# Patient Record
Sex: Female | Born: 1935
Health system: Southern US, Community
[De-identification: ages and names within clinical notes are randomized; demographics above are authoritative.]

## PROBLEM LIST (undated history)

## (undated) DIAGNOSIS — R011 Cardiac murmur, unspecified: Secondary | ICD-10-CM

## (undated) DIAGNOSIS — C439 Malignant melanoma of skin, unspecified: Secondary | ICD-10-CM

## (undated) DIAGNOSIS — J45909 Unspecified asthma, uncomplicated: Secondary | ICD-10-CM

## (undated) DIAGNOSIS — D320 Benign neoplasm of cerebral meninges: Secondary | ICD-10-CM

## (undated) DIAGNOSIS — R413 Other amnesia: Secondary | ICD-10-CM

## (undated) DIAGNOSIS — E7800 Pure hypercholesterolemia, unspecified: Secondary | ICD-10-CM

## (undated) DIAGNOSIS — D649 Anemia, unspecified: Secondary | ICD-10-CM

## (undated) DIAGNOSIS — M1990 Unspecified osteoarthritis, unspecified site: Secondary | ICD-10-CM

## (undated) DIAGNOSIS — I10 Essential (primary) hypertension: Secondary | ICD-10-CM

## (undated) DIAGNOSIS — K219 Gastro-esophageal reflux disease without esophagitis: Secondary | ICD-10-CM

## (undated) HISTORY — DX: Other amnesia: R41.3

## (undated) HISTORY — PX: TOTAL ABDOMINAL HYSTERECTOMY: SHX209

## (undated) HISTORY — PX: JOINT REPLACEMENT: SHX530

## (undated) HISTORY — PX: REPLACEMENT TOTAL KNEE: SUR1224

## (undated) HISTORY — PX: APPENDECTOMY: SHX54

---

## 2000-03-04 ENCOUNTER — Encounter: Admission: RE | Admit: 2000-03-04 | Discharge: 2000-03-04 | Payer: Self-pay | Admitting: Internal Medicine

## 2000-03-04 ENCOUNTER — Encounter: Payer: Self-pay | Admitting: Internal Medicine

## 2001-03-19 ENCOUNTER — Encounter: Payer: Self-pay | Admitting: Internal Medicine

## 2001-03-19 ENCOUNTER — Encounter: Admission: RE | Admit: 2001-03-19 | Discharge: 2001-03-19 | Payer: Self-pay | Admitting: Internal Medicine

## 2002-03-30 ENCOUNTER — Encounter: Payer: Self-pay | Admitting: Internal Medicine

## 2002-03-30 ENCOUNTER — Encounter: Admission: RE | Admit: 2002-03-30 | Discharge: 2002-03-30 | Payer: Self-pay | Admitting: Internal Medicine

## 2003-03-22 ENCOUNTER — Encounter: Payer: Self-pay | Admitting: Internal Medicine

## 2003-03-22 ENCOUNTER — Encounter: Admission: RE | Admit: 2003-03-22 | Discharge: 2003-03-22 | Payer: Self-pay | Admitting: Internal Medicine

## 2003-03-28 ENCOUNTER — Encounter: Payer: Self-pay | Admitting: Internal Medicine

## 2003-03-28 ENCOUNTER — Encounter: Admission: RE | Admit: 2003-03-28 | Discharge: 2003-03-28 | Payer: Self-pay | Admitting: Internal Medicine

## 2004-05-01 ENCOUNTER — Encounter: Admission: RE | Admit: 2004-05-01 | Discharge: 2004-05-01 | Payer: Self-pay | Admitting: Internal Medicine

## 2005-07-30 ENCOUNTER — Encounter: Admission: RE | Admit: 2005-07-30 | Discharge: 2005-07-30 | Payer: Self-pay | Admitting: Internal Medicine

## 2007-02-19 ENCOUNTER — Encounter: Admission: RE | Admit: 2007-02-19 | Discharge: 2007-02-19 | Payer: Self-pay | Admitting: Internal Medicine

## 2008-02-22 ENCOUNTER — Encounter: Admission: RE | Admit: 2008-02-22 | Discharge: 2008-02-22 | Payer: Self-pay | Admitting: Internal Medicine

## 2008-05-05 ENCOUNTER — Inpatient Hospital Stay (HOSPITAL_COMMUNITY): Admission: RE | Admit: 2008-05-05 | Discharge: 2008-05-09 | Payer: Self-pay | Admitting: Orthopaedic Surgery

## 2008-05-06 ENCOUNTER — Ambulatory Visit: Payer: Self-pay | Admitting: Physical Medicine & Rehabilitation

## 2009-02-23 ENCOUNTER — Encounter: Admission: RE | Admit: 2009-02-23 | Discharge: 2009-02-23 | Payer: Self-pay | Admitting: Internal Medicine

## 2010-02-26 ENCOUNTER — Encounter: Admission: RE | Admit: 2010-02-26 | Discharge: 2010-02-26 | Payer: Self-pay | Admitting: Internal Medicine

## 2010-05-21 ENCOUNTER — Observation Stay (HOSPITAL_COMMUNITY)
Admission: EM | Admit: 2010-05-21 | Discharge: 2010-05-23 | Payer: Self-pay | Source: Home / Self Care | Attending: Internal Medicine | Admitting: Internal Medicine

## 2010-08-13 LAB — BASIC METABOLIC PANEL
BUN: 13 mg/dL (ref 6–23)
BUN: 14 mg/dL (ref 6–23)
CO2: 23 mEq/L (ref 19–32)
CO2: 27 mEq/L (ref 19–32)
Calcium: 9.5 mg/dL (ref 8.4–10.5)
Chloride: 100 mEq/L (ref 96–112)
Creatinine, Ser: 0.93 mg/dL (ref 0.4–1.2)
GFR calc non Af Amer: 40 mL/min — ABNORMAL LOW (ref >60)
GFR calc non Af Amer: 58 mL/min — ABNORMAL LOW (ref >60)
Glucose, Bld: 115 mg/dL — ABNORMAL HIGH (ref 70–99)
Sodium: 136 mEq/L (ref 135–145)
Sodium: 138 mEq/L (ref 135–145)

## 2010-08-13 LAB — COMPREHENSIVE METABOLIC PANEL
ALT: 11 U/L (ref 0–35)
AST: 16 U/L (ref 0–37)
Alkaline Phosphatase: 52 U/L (ref 39–117)
BUN: 20 mg/dL (ref 6–23)
Creatinine, Ser: 1.18 mg/dL (ref 0.4–1.2)
Glucose, Bld: 104 mg/dL — ABNORMAL HIGH (ref 70–99)
Total Bilirubin: 0.4 mg/dL (ref 0.3–1.2)

## 2010-08-13 LAB — URINALYSIS, ROUTINE W REFLEX MICROSCOPIC
Glucose, UA: NEGATIVE mg/dL
Protein, ur: NEGATIVE mg/dL
pH: 7.5 (ref 5.0–8.0)

## 2010-08-13 LAB — CBC
HCT: 37.5 % (ref 36.0–46.0)
MCH: 30 pg (ref 26.0–34.0)
Platelets: 221 10*3/uL (ref 150–400)
RBC: 4.13 MIL/uL (ref 3.87–5.11)
RDW: 12.9 % (ref 11.5–15.5)
WBC: 6.6 10*3/uL (ref 4.0–10.5)
WBC: 6.7 10*3/uL (ref 4.0–10.5)

## 2010-10-16 NOTE — Op Note (Signed)
[name], West               ACCOUNT NO.:  [id number]   MEDICAL RECORD NO.:  [id number]          PATIENT TYPE:  INP   LOCATION:  5013                         FACILITY:  [facility]   PHYSICIAN:  Lubertha Basque. Dalldorf, M.D.DATE OF BIRTH:  Sep 19, 1929   DATE OF PROCEDURE:  05/05/2008  DATE OF DISCHARGE:                               OPERATIVE REPORT   PREOPERATIVE DIAGNOSIS:  Right knee degenerative arthritis.   POSTOPERATIVE DIAGNOSIS:  Right knee degenerative arthritis.   PROCEDURE:  Right total knee replacement.   ANESTHESIA:  General and block.   ATTENDING SURGEON:  Lubertha Basque. Jerl Santos, MD   ASSISTANT:  Lindwood Qua, PA   INDICATIONS FOR PROCEDURE:  The patient is a 77 year old woman with a  very long history of right knee pain.  This has persisted despite oral  anti-inflammatories and several injections all which have afforded her  some relief.  She has pain which limits her ability to rest and walk and  at this point she is offered a knee replacement.  Informed operative  consent was obtained after discussion of possible complications  including reaction to anesthesia, infection, DVT, PE, and death.  The  importance of the postoperative rehabilitation protocol to optimize  result was also stressed with the patient.   SUMMARY OF FINDINGS AND PROCEDURE:  Under general anesthesia and a  block, right knee replacement was performed.  She had advanced  degenerative change medial and patellofemoral and good bone quality.  We  addressed her problem with a cemented DePuy LPS system using standard  plus femur, four tibia, 10-mm deep dish spacer, and 35-mm all  polyethylene patella.  Bryna Colander assisted throughout and was  invaluable to the completion of the case and that he helped position and  retract while I performed the procedure.  He also closed simultaneously  to help minimize OR time.  We did include Zinacef antibiotic in the  cement and she easily flexed up to 120 against  gravity at the end of the  case.   DESCRIPTION OF PROCEDURE:  The patient was taken to the operating suite  where general anesthetic was applied without difficulty.  She was also  given a block in the preanesthesia area.  She was positioned supine and  prepped and draped in normal sterile fashion.  After administration of  IV Kefzol, the right leg was elevated, exsanguinated, and tourniquet  inflated about the thigh.  A longitudinal incision was made with  dissection down the extensor mechanism.  All appropriate anti-infected  measures were used including closed hooded exhaust systems for each  member of the surgical team, Betadine impregnated drape, and  preoperative IV antibiotic.  A medial parapatellar incision was made.  The kneecap was flipped and the knee flexed.  Some residual meniscal  tissues were removed along the ACL and PCL.  An extramedullary guide was  placed on the tibia to make a cut with a slight posterior tilt.  The  femur was then dressed with an intramedullary guide making anterior and  posterior cuts creating a flexion gap of 10 mm.  A second intramedullary  guide  was placed in the femur to make a distal cut creating an extension  gap of 10 mm balancing the knee.  We did set up slightly loose with her  flexion contracture preoperatively in mind.  The femur sized to a  standard plus and the tibia to a four and appropriate guides were placed  and utilized.  The patella was cut down thickness by 8 mm to 15 and  sized to a 35 with appropriate guide placed and utilized.  A trial  reduction was done and she easily came into full extension and slight  hyperextension.  She flexed well and the patella tracked well.  Trial  components were removed followed by pulsatile lavage irrigation of all  three cut bony surfaces.  Cement was mixed including Zinacef and was  pressurized onto the bones followed by placement of the aforementioned  DePuy LPS components.  Excess cement was  trimmed and pressure was held  in the components until the cement had hardened.  The tourniquet was  deflated and a small amount of bleeding was easily controlled with  pressure and Bovie cautery.  The knee was again irrigated followed by  placement of a drain exiting superolaterally.  The extensor mechanism  was reapproximated with #1 Vicryl in interrupted fashion followed by  subcutaneous reapproximation with an 0 and 2-0 undyed Vicryl and skin  closure with staples.  Adaptic was applied followed by dry gauze and a  loose Ace wrap.  The estimated blood loss and intraoperative fluids can  be obtained from anesthesia records as can accurate tourniquet time.   DISPOSITION:  The patient was extubated in the operating room and taken  to recovery room in stable addition.  She was to be admitted to the  Orthopedic Surgery Service for appropriate postop care to include  perioperative antibiotics and Coumadin plus Lovenox for DVT prophylaxis.      Lubertha Basque Jerl Santos, M.D.  Electronically Signed     PGD/MEDQ  D:  05/05/2008  T:  05/05/2008  Job:  [id number]

## 2010-10-16 NOTE — Discharge Summary (Signed)
NAMEORPHIA, MCTIGUE               ACCOUNT NO.:  [id number]   MEDICAL RECORD NO.:  [id number]          PATIENT TYPE:  INP   LOCATION:  5013                         FACILITY:  [facility]   PHYSICIAN:  Lubertha Basque. Dalldorf, M.D.DATE OF BIRTH:  [birth year]   DATE OF ADMISSION:  05/05/2008  DATE OF DISCHARGE:  05/09/2008                               DISCHARGE SUMMARY   ADMISSION DIAGNOSES:  1. Right knee end-stage degenerative joint disease.  2. History of hypertension.  3. History of melanoma.  4. History of anemia.   DISCHARGE DIAGNOSIS:  1. Right knee end-stage degenerative joint disease.  2. History of hypertension.  3. History of melanoma.  4. History of anemia.  5. Hypokalemia.   BRIEF HISTORY:  Ms. Sheila West is a 77 year old white female patient well-  known to our practice who is having increasing right knee pain, pain  with every step, trouble sleeping at night time, and x-rays reveal end-  stage degenerative joint disease.  She has failed corticosteroid  injections, Synvisc injections and nonsteroidal anti-inflammatory drugs  as well.  We have discussed treatment options with her, that being total  knee replacement, with the risks of anesthesia, infection, DVT and  possible death.   PERTINENT LABORATORY AND X-RAY FINDINGS:  CBC:  Hemoglobin 11.7,  hematocrit 34, WBCs 10.4, platelets 269.  Sodium 138, potassium dropped  to 3.1 and was supplemented orally, chloride 97, BUN 16, creatinine 0.9,  glucose ranged from 98 to 150.  Last INR 3.0.   COURSE IN THE HOSPITAL:  She was admitted postoperatively, placed on  variety p.o. and IM analgesics for pain, including an IV PCA pump for  pain control reduced protocol, IV Ancef 1 g q.8 h. x3 doses, Coumadin  and Lovenox protocol per pharmacy DVT prophylaxis, stool softeners,  laxatives as needed.  During her hospital stay she did have 2 bowel  movements.  Oral pain medications, antiemetics, ice pack, knee-high  TEDs, incentive spirometry  also used, physical therapy for out of bed  weightbearing as tolerated.  CPM machine 0 to 50 degrees and advance as  tolerated as well to 6 to 8 hours per day and then follow up lab studies  as mentioned above.  She also was on O2 2 liters nasal cannula.  The  first day postop, her vital signs were stable.  She was eating, had a  Foley catheter in which was discontinued eventually and able to void on  her.  Physical therapy was ordered for weightbearing as tolerated.  The  second day postop her dressing was changed, her drain was pulled, her  wound was noted to be benign.  Her lungs were clear.  Abdomen was soft.  Worked well with physical therapy and it was felt that as she lives  alone, would be best served by being discharged to a skilled nursing  facility.  This was arranged and on her discharge, the day of, her INR  was 3.0.  She had supplemented potassium as her potassium was low on one  occasion at 3.1, and she was weightbearing as tolerated, walking with  physical therapy, and  was discharged to the skilled nursing facility.   CONDITION ON DISCHARGE:  Improved.   FOLLOWUP:  She will remain on a low-sodium, heart-healthy diet.  Dressing changes daily.  Weightbearing as tolerated with physical  therapy and also the use of a CPM machine is helpful 0 to 60, advance as  tolerated 4 to 8 hours a day until 90 degrees was reached and then it  could be discontinued.  She would be on Coumadin for 2 weeks from the  time of surgery with an INR range of to 2-3.  Pain medication, Percocet  1 or 2 q.4-6 h. p.r.n. pain, and on her home medications:   1. Hydrochlorothiazide 25 mg one a day.  2. Lecithin 1200 mg over-the-counter one a day.  3. Calcium 600 with vitamin D one a day.  4. Zantac 75 mg one p.o. b.i.d. p.r.n.  5. Coumadin protocol for DVT prophylaxis for 2 weeks with that INR      range.   Also we would have on 12/08 a BMET panel drawn in addition to her INR to  monitor her  potassium.  If her potassium is below 3.5, then I would  supplement her with at least 10 mg p.o. daily and then repeat it in 2  days.  She would return to our office in 2 weeks from the time of  surgery for staple removal.  Any sign of infection, increasing redness,  drainage, increasing pain, she will return immediately or least one  phone call so that we can arrange for her to be seen.      Lindwood Qua, P.A.      Lubertha Basque Jerl Santos, M.D.  Electronically Signed    MC/MEDQ  D:  05/09/2008  T:  05/09/2008  Job:  [id number]

## 2010-11-09 ENCOUNTER — Other Ambulatory Visit: Payer: Self-pay | Admitting: Neurosurgery

## 2010-11-09 DIAGNOSIS — D329 Benign neoplasm of meninges, unspecified: Secondary | ICD-10-CM

## 2010-11-19 ENCOUNTER — Ambulatory Visit
Admission: RE | Admit: 2010-11-19 | Discharge: 2010-11-19 | Disposition: A | Discharge status: Home / Self Care | Payer: Medicare Other | Source: Ambulatory Visit | Attending: Neurosurgery | Admitting: Neurosurgery

## 2010-11-19 DIAGNOSIS — D329 Benign neoplasm of meninges, unspecified: Secondary | ICD-10-CM

## 2010-11-19 MED ORDER — GADOBENATE DIMEGLUMINE 529 MG/ML IV SOLN
14.0000 mL | Freq: Once | INTRAVENOUS | Status: AC | PRN
  Administered 2010-11-19: 14 mL via INTRAVENOUS

## 2011-01-22 ENCOUNTER — Other Ambulatory Visit: Payer: Self-pay | Admitting: Internal Medicine

## 2011-01-22 DIAGNOSIS — Z1231 Encounter for screening mammogram for malignant neoplasm of breast: Secondary | ICD-10-CM

## 2011-02-28 ENCOUNTER — Ambulatory Visit
Admission: RE | Admit: 2011-02-28 | Discharge: 2011-02-28 | Disposition: A | Discharge status: Home / Self Care | Payer: Medicare Other | Source: Ambulatory Visit | Attending: Internal Medicine | Admitting: Internal Medicine

## 2011-02-28 DIAGNOSIS — Z1231 Encounter for screening mammogram for malignant neoplasm of breast: Secondary | ICD-10-CM

## 2011-03-05 LAB — BASIC METABOLIC PANEL
BUN: 12 mg/dL (ref 6–23)
CO2: 28 mEq/L (ref 19–32)
Calcium: 10.2 mg/dL (ref 8.4–10.5)
Chloride: 100 mEq/L (ref 96–112)
Glucose, Bld: 98 mg/dL (ref 70–99)
Potassium: 3.3 mEq/L — ABNORMAL LOW (ref 3.5–5.1)

## 2011-03-05 LAB — CBC
MCHC: 34 g/dL (ref 30.0–36.0)
MCV: 92.8 fL (ref 78.0–100.0)
Platelets: 281 10*3/uL (ref 150–400)
WBC: 7.2 10*3/uL (ref 4.0–10.5)

## 2011-03-08 LAB — CBC
HCT: 31.5 % — ABNORMAL LOW (ref 36.0–46.0)
HCT: 31.5 % — ABNORMAL LOW (ref 36.0–46.0)
Hemoglobin: 10.9 g/dL — ABNORMAL LOW (ref 12.0–15.0)
Hemoglobin: 10.9 g/dL — ABNORMAL LOW (ref 12.0–15.0)
MCHC: 34.4 g/dL (ref 30.0–36.0)
MCHC: 34.7 g/dL (ref 30.0–36.0)
MCHC: 34.8 g/dL (ref 30.0–36.0)
MCV: 92.4 fL (ref 78.0–100.0)
Platelets: 240 10*3/uL (ref 150–400)
Platelets: 269 10*3/uL (ref 150–400)
RDW: 12.9 % (ref 11.5–15.5)
RDW: 13.1 % (ref 11.5–15.5)

## 2011-03-08 LAB — BASIC METABOLIC PANEL
BUN: 16 mg/dL (ref 6–23)
CO2: 29 mEq/L (ref 19–32)
CO2: 29 mEq/L (ref 19–32)
CO2: 31 mEq/L (ref 19–32)
Calcium: 9.1 mg/dL (ref 8.4–10.5)
Chloride: 100 mEq/L (ref 96–112)
Chloride: 97 mEq/L (ref 96–112)
Creatinine, Ser: 0.9 mg/dL (ref 0.4–1.2)
Creatinine, Ser: 0.99 mg/dL (ref 0.4–1.2)
GFR calc non Af Amer: 55 mL/min — ABNORMAL LOW (ref >60)
Glucose, Bld: 123 mg/dL — ABNORMAL HIGH (ref 70–99)
Glucose, Bld: 136 mg/dL — ABNORMAL HIGH (ref 70–99)
Glucose, Bld: 150 mg/dL — ABNORMAL HIGH (ref 70–99)
Potassium: 3.7 mEq/L (ref 3.5–5.1)
Potassium: 4.2 mEq/L (ref 3.5–5.1)
Sodium: 137 mEq/L (ref 135–145)
Sodium: 137 mEq/L (ref 135–145)

## 2011-03-08 LAB — PROTIME-INR
Prothrombin Time: 14 seconds (ref 11.6–15.2)
Prothrombin Time: 24.8 seconds — ABNORMAL HIGH (ref 11.6–15.2)

## 2011-05-10 ENCOUNTER — Other Ambulatory Visit: Payer: Self-pay | Admitting: Neurosurgery

## 2011-05-10 DIAGNOSIS — D496 Neoplasm of unspecified behavior of brain: Secondary | ICD-10-CM

## 2011-05-13 ENCOUNTER — Ambulatory Visit
Admission: RE | Admit: 2011-05-13 | Discharge: 2011-05-13 | Disposition: A | Discharge status: Home / Self Care | Payer: Medicare Other | Source: Ambulatory Visit | Attending: Neurosurgery | Admitting: Neurosurgery

## 2011-05-13 DIAGNOSIS — D496 Neoplasm of unspecified behavior of brain: Secondary | ICD-10-CM

## 2011-05-13 MED ORDER — GADOBENATE DIMEGLUMINE 529 MG/ML IV SOLN
12.0000 mL | Freq: Once | INTRAVENOUS | Status: AC | PRN
  Administered 2011-05-13: 12 mL via INTRAVENOUS

## 2012-02-11 ENCOUNTER — Other Ambulatory Visit: Payer: Self-pay | Admitting: Internal Medicine

## 2012-02-11 DIAGNOSIS — Z1231 Encounter for screening mammogram for malignant neoplasm of breast: Secondary | ICD-10-CM

## 2012-03-04 ENCOUNTER — Ambulatory Visit
Admission: RE | Admit: 2012-03-04 | Discharge: 2012-03-04 | Disposition: A | Discharge status: Home / Self Care | Payer: Medicare Other | Source: Ambulatory Visit | Attending: Internal Medicine | Admitting: Internal Medicine

## 2012-03-04 DIAGNOSIS — Z1231 Encounter for screening mammogram for malignant neoplasm of breast: Secondary | ICD-10-CM

## 2012-04-07 ENCOUNTER — Other Ambulatory Visit: Payer: Self-pay | Admitting: Neurosurgery

## 2012-04-07 DIAGNOSIS — D496 Neoplasm of unspecified behavior of brain: Secondary | ICD-10-CM

## 2012-05-11 ENCOUNTER — Ambulatory Visit
Admission: RE | Admit: 2012-05-11 | Discharge: 2012-05-11 | Disposition: A | Discharge status: Home / Self Care | Payer: Medicare Other | Source: Ambulatory Visit | Attending: Neurosurgery | Admitting: Neurosurgery

## 2012-05-11 DIAGNOSIS — D496 Neoplasm of unspecified behavior of brain: Secondary | ICD-10-CM

## 2012-05-11 MED ORDER — GADOBENATE DIMEGLUMINE 529 MG/ML IV SOLN
13.0000 mL | Freq: Once | INTRAVENOUS | Status: AC | PRN
  Administered 2012-05-11: 13 mL via INTRAVENOUS

## 2013-01-07 ENCOUNTER — Other Ambulatory Visit: Payer: Self-pay | Admitting: Internal Medicine

## 2013-01-07 DIAGNOSIS — R1011 Right upper quadrant pain: Secondary | ICD-10-CM

## 2013-01-11 ENCOUNTER — Ambulatory Visit
Admission: RE | Admit: 2013-01-11 | Discharge: 2013-01-11 | Disposition: A | Discharge status: Home / Self Care | Payer: Medicare Other | Source: Ambulatory Visit | Attending: Internal Medicine | Admitting: Internal Medicine

## 2013-01-11 DIAGNOSIS — R1011 Right upper quadrant pain: Secondary | ICD-10-CM

## 2013-01-11 MED ORDER — IOHEXOL 300 MG/ML  SOLN
100.0000 mL | Freq: Once | INTRAMUSCULAR | Status: AC | PRN
  Administered 2013-01-11: 100 mL via INTRAVENOUS

## 2013-03-26 ENCOUNTER — Other Ambulatory Visit: Payer: Self-pay

## 2013-03-26 DIAGNOSIS — Z1231 Encounter for screening mammogram for malignant neoplasm of breast: Secondary | ICD-10-CM

## 2013-04-19 ENCOUNTER — Ambulatory Visit
Admission: RE | Admit: 2013-04-19 | Discharge: 2013-04-19 | Disposition: A | Discharge status: Home / Self Care | Payer: Medicare Other | Source: Ambulatory Visit

## 2013-04-19 DIAGNOSIS — Z1231 Encounter for screening mammogram for malignant neoplasm of breast: Secondary | ICD-10-CM

## 2013-08-19 ENCOUNTER — Emergency Department (HOSPITAL_COMMUNITY): Payer: Medicare Other

## 2013-08-19 ENCOUNTER — Encounter (HOSPITAL_COMMUNITY): Payer: Self-pay | Admitting: Emergency Medicine

## 2013-08-19 ENCOUNTER — Emergency Department (HOSPITAL_COMMUNITY)
Admission: EM | Admit: 2013-08-19 | Discharge: 2013-08-19 | Disposition: A | Discharge status: Home / Self Care | Payer: Medicare Other | Attending: Emergency Medicine | Admitting: Emergency Medicine

## 2013-08-19 DIAGNOSIS — Z85828 Personal history of other malignant neoplasm of skin: Secondary | ICD-10-CM | POA: Insufficient documentation

## 2013-08-19 DIAGNOSIS — R42 Dizziness and giddiness: Secondary | ICD-10-CM | POA: Insufficient documentation

## 2013-08-19 DIAGNOSIS — E860 Dehydration: Secondary | ICD-10-CM

## 2013-08-19 DIAGNOSIS — K5900 Constipation, unspecified: Secondary | ICD-10-CM

## 2013-08-19 DIAGNOSIS — I10 Essential (primary) hypertension: Secondary | ICD-10-CM | POA: Insufficient documentation

## 2013-08-19 DIAGNOSIS — Z79899 Other long term (current) drug therapy: Secondary | ICD-10-CM | POA: Insufficient documentation

## 2013-08-19 HISTORY — DX: Benign neoplasm of cerebral meninges: D32.0

## 2013-08-19 HISTORY — DX: Malignant melanoma of skin, unspecified: C43.9

## 2013-08-19 HISTORY — DX: Essential (primary) hypertension: I10

## 2013-08-19 LAB — I-STAT CHEM 8, ED
BUN: 23 mg/dL (ref 6–23)
Calcium, Ion: 1.23 mmol/L (ref 1.13–1.30)
Chloride: 102 mEq/L (ref 96–112)
Creatinine, Ser: 1.1 mg/dL (ref 0.50–1.10)
Glucose, Bld: 95 mg/dL (ref 70–99)
HCT: 43 % (ref 36.0–46.0)
Hemoglobin: 14.6 g/dL (ref 12.0–15.0)
Potassium: 3.6 mEq/L — ABNORMAL LOW (ref 3.7–5.3)
Sodium: 140 meq/L (ref 137–147)
TCO2: 28 mmol/L (ref 0–100)

## 2013-08-19 LAB — I-STAT TROPONIN, ED: Troponin i, poc: 0 ng/mL (ref 0.00–0.08)

## 2013-08-19 MED ORDER — POLYETHYLENE GLYCOL 3350 17 G PO PACK: 17.0000 g | PACK | Freq: Every day | ORAL | Status: DC | PRN

## 2013-08-19 MED ORDER — SODIUM CHLORIDE 0.9 % IV BOLUS (SEPSIS)
500.0000 mL | Freq: Once | INTRAVENOUS | Status: AC
  Administered 2013-08-19: 500 mL via INTRAVENOUS

## 2013-08-19 NOTE — ED Notes (Addendum)
Pt presents via [facility] from her PCP office. Pt c/o LLQ pain that began approx 2 weeks ago. Pt reports has lost approx 15lbs over the past few months without trying. Pt's PCP performed an EKG, pt in NSR, pt denies SOB or CP.

## 2013-08-19 NOTE — ED Notes (Signed)
Dr. Ghim at bedside. 

## 2013-08-19 NOTE — ED Notes (Signed)
Pt ambulated very well in the hall. No complaints or difficulties.

## 2013-08-19 NOTE — Discharge Instructions (Signed)
Constipation, Adult Constipation is when a person has fewer than 3 bowel movements a week; has difficulty having a bowel movement; or has stools that are dry, hard, or larger than normal. As people grow older, constipation is more common. If you try to fix constipation with medicines that make you have a bowel movement (laxatives), the problem may get worse. Long-term laxative use may cause the muscles of the colon to become weak. A low-fiber diet, not taking in enough fluids, and taking certain medicines may make constipation worse. CAUSES   Certain medicines, such as antidepressants, pain medicine, iron supplements, antacids, and water pills.   Certain diseases, such as diabetes, irritable bowel syndrome (IBS), thyroid disease, or depression.   Not drinking enough water.   Not eating enough fiber-rich foods.   Stress or travel.  Lack of physical activity or exercise.  Not going to the restroom when there is the urge to have a bowel movement.  Ignoring the urge to have a bowel movement.  Using laxatives too much. SYMPTOMS   Having fewer than 3 bowel movements a week.   Straining to have a bowel movement.   Having hard, dry, or larger than normal stools.   Feeling full or bloated.   Pain in the lower abdomen.  Not feeling relief after having a bowel movement. DIAGNOSIS  Your caregiver will take a medical history and perform a physical exam. Further testing may be done for severe constipation. Some tests may include:   A barium enema X-ray to examine your rectum, colon, and sometimes, your small intestine.  A sigmoidoscopy to examine your lower colon.  A colonoscopy to examine your entire colon. TREATMENT  Treatment will depend on the severity of your constipation and what is causing it. Some dietary treatments include drinking more fluids and eating more fiber-rich foods. Lifestyle treatments may include regular exercise. If these diet and lifestyle recommendations  do not help, your caregiver may recommend taking over-the-counter laxative medicines to help you have bowel movements. Prescription medicines may be prescribed if over-the-counter medicines do not work.  HOME CARE INSTRUCTIONS   Increase dietary fiber in your diet, such as fruits, vegetables, whole grains, and beans. Limit high-fat and processed sugars in your diet, such as [place] fries, hamburgers, cookies, candies, and soda.   A fiber supplement may be added to your diet if you cannot get enough fiber from foods.   Drink enough fluids to keep your urine clear or pale yellow.   Exercise regularly or as directed by your caregiver.   Go to the restroom when you have the urge to go. Do not hold it.  Only take medicines as directed by your caregiver. Do not take other medicines for constipation without talking to your caregiver first. [place] IF:   You have bright red blood in your stool.   Your constipation lasts for more than 4 days or gets worse.   You have abdominal or rectal pain.   You have thin, pencil-like stools.  You have unexplained weight loss. MAKE SURE YOU:   Understand these instructions.  Will watch your condition.  Will get help right away if you are not doing well or get worse. Document Released: 02/16/2004 Document Revised: 08/12/2011 Document Reviewed: 03/01/2013 [facility] Patient Information 2014 [place], [place].     Dehydration, Adult Dehydration is when you lose more fluids from the body than you take in. Vital organs like the kidneys, brain, and heart cannot function without a proper amount of fluids  and salt. Any loss of fluids from the body can cause dehydration.  CAUSES   Vomiting.  Diarrhea.  Excessive sweating.  Excessive urine output.  Fever. SYMPTOMS  Mild dehydration  Thirst.  Dry lips.  Slightly dry mouth. Moderate dehydration  Very dry mouth.  Sunken eyes.  Skin does not bounce back quickly when  lightly pinched and released.  Dark urine and decreased urine production.  Decreased tear production.  Headache. Severe dehydration  Very dry mouth.  Extreme thirst.  Rapid, weak pulse (more than 100 beats per minute at rest).  Cold hands and feet.  Not able to sweat in spite of heat and temperature.  Rapid breathing.  Blue lips.  Confusion and lethargy.  Difficulty being awakened.  Minimal urine production.  No tears. DIAGNOSIS  Your caregiver will diagnose dehydration based on your symptoms and your exam. Blood and urine tests will help confirm the diagnosis. The diagnostic evaluation should also identify the cause of dehydration. TREATMENT  Treatment of mild or moderate dehydration can often be done at home by increasing the amount of fluids that you drink. It is best to drink small amounts of fluid more often. Drinking too much at one time can make vomiting worse. Refer to the home care instructions below. Severe dehydration needs to be treated at the hospital where you will probably be given intravenous (IV) fluids that contain water and electrolytes. HOME CARE INSTRUCTIONS   Ask your caregiver about specific rehydration instructions.  Drink enough fluids to keep your urine clear or pale yellow.  Drink small amounts frequently if you have nausea and vomiting.  Eat as you normally do.  Avoid:  Foods or drinks high in sugar.  Carbonated drinks.  Juice.  Extremely hot or cold fluids.  Drinks with caffeine.  Fatty, greasy foods.  Alcohol.  Tobacco.  Overeating.  Gelatin desserts.  Wash your hands well to avoid spreading bacteria and viruses.  Only take over-the-counter or prescription medicines for pain, discomfort, or fever as directed by your caregiver.  Ask your caregiver if you should continue all prescribed and over-the-counter medicines.  Keep all follow-up appointments with your caregiver. SEEK MEDICAL CARE IF:  You have abdominal  pain and it increases or stays in one area (localizes).  You have a rash, stiff neck, or severe headache.  You are irritable, sleepy, or difficult to awaken.  You are weak, dizzy, or extremely thirsty. SEEK IMMEDIATE MEDICAL CARE IF:   You are unable to keep fluids down or you get worse despite treatment.  You have frequent episodes of vomiting or diarrhea.  You have blood or green matter (bile) in your vomit.  You have blood in your stool or your stool looks black and tarry.  You have not urinated in 6 to 8 hours, or you have only urinated a small amount of very dark urine.  You have a fever.  You faint. MAKE SURE YOU:   Understand these instructions.  Will watch your condition.  Will get help right away if you are not doing well or get worse. Document Released: 05/20/2005 Document Revised: 08/12/2011 Document Reviewed: 01/07/2011 [facility] Patient Information 2014 [place], [place].

## 2013-08-19 NOTE — ED Provider Notes (Signed)
CSN: [id number]     Arrival date & time 08/19/13  1458 History   First MD Initiated Contact with Patient 08/19/13 1507     Chief Complaint  Patient presents with  . Abdominal Pain     (Consider location/radiation/quality/duration/timing/severity/associated sxs/prior Treatment) HPI Comments: Pt reports no pain on a daily basis, maybe every other day.  Lasts for about 1 hour.  No N/V/D.  No night sweats, unusual cough, no back pain.  She was seen at Dr. Nancee Liter office but saw a different provider who did an ECG and sent her to the ED for further eval.  Pt denies upper back pain or CP.  No dizziness, syncope, SOB.  She has had an appendectomy and hysterectomy many years ago.  Never had pain like this.  Patient is a 80 y.o. female presenting with abdominal pain. The history is provided by the patient.  Abdominal Pain Pain location:  LLQ Pain quality: throbbing   Pain radiates to:  Does not radiate Duration:  2 weeks Timing:  Intermittent Progression:  Unchanged Chronicity:  New Context: not diet changes, not eating, not previous surgeries, not recent illness, not sick contacts, not suspicious food intake and not trauma   Relieved by:  Nothing Worsened by:  Nothing tried Associated symptoms: no anorexia, no chills, no cough, no diarrhea, no fever, no melena and no nausea   Risk factors: being elderly and multiple surgeries     Past Medical History  Diagnosis Date  . Hypertension   . Melanoma     "I've had multile melanomas removed"  . Intracranial meningioma    Past Surgical History  Procedure Laterality Date  . Abdominal hysterectomy    . Replacement total knee Right    History reviewed. No pertinent family history. History  Substance Use Topics  . Smoking status: Not on file  . Smokeless tobacco: Not on file  . Alcohol Use: No   OB History   Grav Para Term Preterm Abortions TAB SAB Ect Mult Living                 Review of Systems  Constitutional: Negative for  fever and chills.  Respiratory: Negative for cough.   Gastrointestinal: Positive for abdominal pain. Negative for nausea, diarrhea, melena and anorexia.  Musculoskeletal: Negative for back pain, neck pain and neck stiffness.  All other systems reviewed and are negative.      Allergies  Review of patient's allergies indicates no known allergies.  Home Medications   Current Outpatient Rx  Name  Route  Sig  Dispense  Refill  . alendronate (FOSAMAX) 70 MG tablet   Oral   Take 70 mg by mouth once a week. Take with a full glass of water on an empty stomach.         . Calcium Carb-Cholecalciferol (CALCIUM 600 + D PO)   Oral   Take 1 tablet by mouth daily.         . Coenzyme Q10 (CO Q-10) 50 MG CAPS   Oral   Take 50 mg by mouth daily.         . hydrochlorothiazide (HYDRODIURIL) 25 MG tablet   Oral   Take 25 mg by mouth daily.         . simvastatin (ZOCOR) 20 MG tablet   Oral   Take 20 mg by mouth daily.         . vitamin B-12 (CYANOCOBALAMIN) 250 MCG tablet   Oral   Take 250 mcg  by mouth daily.         . Vitamin D, Cholecalciferol, 1000 UNITS TABS   Oral   Take 1,000 Units by mouth daily.         . polyethylene glycol (MIRALAX / GLYCOLAX) packet   Oral   Take 17 g by mouth daily as needed for moderate constipation.   7 each   0    BP 144/93  Pulse 99  Temp(Src) 98.5 F (36.9 C) (Oral)  Resp 20  SpO2 99% Physical Exam  Nursing note and vitals reviewed. Constitutional: She is oriented to person, place, and time. She appears well-developed and well-nourished. No distress.  HENT:  Head: Normocephalic and atraumatic.  Eyes: Conjunctivae and EOM are normal.  Neck: Neck supple.  Cardiovascular: Normal rate, regular rhythm, normal heart sounds and intact distal pulses.   No murmur heard. Pulmonary/Chest: Effort normal. No respiratory distress. She has no wheezes.  Abdominal: Soft. She exhibits no distension. There is no tenderness. There is no  rebound.  Neurological: She is alert and oriented to person, place, and time. She exhibits normal muscle tone. Coordination normal.  Skin: Skin is warm and dry. No rash noted. She is not diaphoretic. No pallor.  Psychiatric: She has a normal mood and affect.    ED Course  Procedures (including critical care time) Labs Review Labs Reviewed  I-STAT CHEM 8, ED - Abnormal; Notable for the following:    Potassium 3.6 (*)    All other components within normal limits  I-STAT TROPOININ, ED   Imaging Review Ct Head Wo Contrast  08/19/2013   CLINICAL DATA:  Dizziness, brain tumor  EXAM: CT HEAD WITHOUT CONTRAST  TECHNIQUE: Contiguous axial images were obtained from the base of the skull through the vertex without intravenous contrast.  COMPARISON:  MR HEAD WO/W CM dated 05/11/2012; MR HEAD WO/W CM dated 05/13/2011; MR HEAD WO/W CM dated 11/19/2010; MR MRA HEAD W/O CM dated 05/22/2010; MR HEAD WO/W CM dated 05/22/2010; CT HEAD W/O CM dated 05/22/2010  FINDINGS: There is no evidence of mass effect, midline shift or extra-axial fluid collections. There is no evidence of an intracranial hemorrhage. There is a 2.5 cm hyperdense, partially calcified right frontal extra-axial mass unchanged compared with 05/11/2012 most consistent with a meningioma with mild adjacent low-attenuation likely representing vasogenic edema. There is no evidence of a cortical-based area of acute infarction.  The ventricles and sulci are appropriate for the patient's age. The basal cisterns are patent.  Visualized portions of the orbits are unremarkable. The visualized portions of the paranasal sinuses and mastoid air cells are unremarkable.  The osseous structures are unremarkable.  IMPRESSION: 1. No acute intracranial pathology. 2. Stable right frontal meningioma.   Electronically Signed   By: Kathreen Devoid   On: 08/19/2013 18:00   Dg Abd Acute W/chest  08/19/2013   CLINICAL DATA:  Left lower quadrant pain.  Irregular heartbeat.  EXAM:  ACUTE ABDOMEN SERIES (ABDOMEN 2 VIEW & CHEST 1 VIEW)  COMPARISON:  05/02/2008  FINDINGS: There is small density in the right upper chest and that probably involves the right fourth posterior rib. An ill-defined density at the right lung base probably represents overlying shadows and not seen on the upright abdominal image. Heart and mediastinum are within normal limits. Severe degenerative changes in both shoulders. Severe degenerative changes in the lumbar spine. Moderate stool burden in the abdomen. Evidence of postsurgical changes in the abdomen. Pelvic bony ring is intact. No evidence for free air.  IMPRESSION: No acute chest abnormality.  Nonspecific bowel gas pattern with moderate stool burden.   Electronically Signed   By: Markus Daft M.D.   On: 08/19/2013 18:03     EKG Interpretation At time 15:08 shows S tachy at rate 93, normal axis, biatrial enlargement, non specific ST abn diffusely.  Borderline ECG.      RA sat is 99% and I interpret to be adequate  4:03 PM Spoke to physician who saw pt.  She was concerned about subtle worse ST depression seen on ECG today and pt was mildly tachycardic in the office and pt was complaining about dizziness recently, worsening and subjective orthostasis thsu physician not comfortable with pt going home.  Will get labs, plain films including troponin, check orthostatics and gait.   4:28 PM Pt is likely dehydrated and seems to be orthostatic as her HR goes up from 72 to 111 from laying to standing.    6:19 PM IVF's given, troponin, electrolytes and head CT shows no acute abn's.  I suspect subtle ECG chagnes are simply chronic and non specific, can be continued to be followed as outpt, if she can ambulate safely here in the ED after IVF's will d/c home.  Pt has constipation on plain films which may account for her transient, episodic abd pain.  Will prescribe a laxative as well and encourage more PO liquids.    MDM   Final diagnoses:  Dehydration   Constipation    Pt reports no pain currently, no fevers, chills, N/V/D.  Has had apparently about a 15 lb weight loss but pt cannot tell me over what time frame.  Pt denies CP, SOB.  Will try to contact office to see what their concerns were as pt doesn't seem to have any active concerns currently.  She lives alone.      Saddie Benders. Anakaren Campion, MD 08/19/13 1850

## 2014-01-27 ENCOUNTER — Observation Stay (HOSPITAL_COMMUNITY)
Admission: EM | Admit: 2014-01-27 | Discharge: 2014-01-28 | Disposition: A | Discharge status: Home / Self Care | Payer: Medicare Other | Attending: Internal Medicine | Admitting: Internal Medicine

## 2014-01-27 ENCOUNTER — Emergency Department (HOSPITAL_COMMUNITY): Payer: Medicare Other

## 2014-01-27 ENCOUNTER — Encounter (HOSPITAL_COMMUNITY): Payer: Self-pay | Admitting: Emergency Medicine

## 2014-01-27 DIAGNOSIS — R079 Chest pain, unspecified: Secondary | ICD-10-CM | POA: Diagnosis present

## 2014-01-27 DIAGNOSIS — I10 Essential (primary) hypertension: Secondary | ICD-10-CM | POA: Diagnosis not present

## 2014-01-27 DIAGNOSIS — R072 Precordial pain: Principal | ICD-10-CM | POA: Insufficient documentation

## 2014-01-27 DIAGNOSIS — Z23 Encounter for immunization: Secondary | ICD-10-CM | POA: Insufficient documentation

## 2014-01-27 DIAGNOSIS — Z79899 Other long term (current) drug therapy: Secondary | ICD-10-CM | POA: Insufficient documentation

## 2014-01-27 DIAGNOSIS — Z85820 Personal history of malignant melanoma of skin: Secondary | ICD-10-CM | POA: Insufficient documentation

## 2014-01-27 DIAGNOSIS — Z7983 Long term (current) use of bisphosphonates: Secondary | ICD-10-CM | POA: Insufficient documentation

## 2014-01-27 DIAGNOSIS — E785 Hyperlipidemia, unspecified: Secondary | ICD-10-CM | POA: Insufficient documentation

## 2014-01-27 DIAGNOSIS — R1013 Epigastric pain: Secondary | ICD-10-CM | POA: Diagnosis not present

## 2014-01-27 LAB — BASIC METABOLIC PANEL
Anion gap: 16 — ABNORMAL HIGH (ref 5–15)
BUN: 19 mg/dL (ref 6–23)
CHLORIDE: 98 meq/L (ref 96–112)
CO2: 25 mEq/L (ref 19–32)
CREATININE: 0.98 mg/dL (ref 0.50–1.10)
Calcium: 10.1 mg/dL (ref 8.4–10.5)
GFR calc Af Amer: 61 mL/min — ABNORMAL LOW (ref >90)
GFR calc non Af Amer: 53 mL/min — ABNORMAL LOW (ref >90)
Glucose, Bld: 109 mg/dL — ABNORMAL HIGH (ref 70–99)
Potassium: 3.5 mEq/L — ABNORMAL LOW (ref 3.7–5.3)
Sodium: 139 mEq/L (ref 137–147)

## 2014-01-27 LAB — CBC WITH DIFFERENTIAL/PLATELET
Basophils Absolute: 0 10*3/uL (ref 0.0–0.1)
Basophils Relative: 0 % (ref 0–1)
EOS ABS: 0.1 10*3/uL (ref 0.0–0.7)
EOS PCT: 2 % (ref 0–5)
HCT: 38.6 % (ref 36.0–46.0)
Hemoglobin: 13.1 g/dL (ref 12.0–15.0)
LYMPHS ABS: 1.2 10*3/uL (ref 0.7–4.0)
Lymphocytes Relative: 21 % (ref 12–46)
MCH: 30.3 pg (ref 26.0–34.0)
MCHC: 33.9 g/dL (ref 30.0–36.0)
MCV: 89.1 fL (ref 78.0–100.0)
Monocytes Absolute: 0.5 10*3/uL (ref 0.1–1.0)
Monocytes Relative: 9 % (ref 3–12)
Neutro Abs: 3.7 10*3/uL (ref 1.7–7.7)
Neutrophils Relative %: 68 % (ref 43–77)
PLATELETS: 240 10*3/uL (ref 150–400)
RBC: 4.33 MIL/uL (ref 3.87–5.11)
RDW: 12.6 % (ref 11.5–15.5)
WBC: 5.5 10*3/uL (ref 4.0–10.5)

## 2014-01-27 LAB — HEPATIC FUNCTION PANEL
ALK PHOS: 57 U/L (ref 39–117)
ALT: 9 U/L (ref 0–35)
AST: 15 U/L (ref 0–37)
Albumin: 3.9 g/dL (ref 3.5–5.2)
BILIRUBIN TOTAL: 0.4 mg/dL (ref 0.3–1.2)
Bilirubin, Direct: <0.2 mg/dL (ref 0.0–0.3)
Total Protein: 7.5 g/dL (ref 6.0–8.3)

## 2014-01-27 LAB — TROPONIN I
Troponin I: <0.3 ng/mL (ref <0.30)
Troponin I: <0.3 ng/mL (ref <0.30)

## 2014-01-27 LAB — I-STAT TROPONIN, ED: TROPONIN I, POC: 0 ng/mL (ref 0.00–0.08)

## 2014-01-27 LAB — MRSA PCR SCREENING: MRSA by PCR: NEGATIVE

## 2014-01-27 MED ORDER — HYDROCHLOROTHIAZIDE 25 MG PO TABS
25.0000 mg | ORAL_TABLET | Freq: Every day | ORAL | Status: DC
  Administered 2014-01-27 – 2014-01-28 (×2): 25 mg via ORAL
  Filled 2014-01-27 (×2): qty 1

## 2014-01-27 MED ORDER — ACETAMINOPHEN 650 MG RE SUPP: 650.0000 mg | Freq: Four times a day (QID) | RECTAL | Status: DC | PRN

## 2014-01-27 MED ORDER — NITROGLYCERIN 2 % TD OINT
0.5000 [in_us] | TOPICAL_OINTMENT | Freq: Four times a day (QID) | TRANSDERMAL | Status: DC
  Administered 2014-01-27: 0.5 [in_us] via TOPICAL
  Filled 2014-01-27: qty 30
  Filled 2014-01-27: qty 1

## 2014-01-27 MED ORDER — SODIUM CHLORIDE 0.9 % IV SOLN: 250.0000 mL | INTRAVENOUS | Status: DC | PRN

## 2014-01-27 MED ORDER — ONDANSETRON HCL 4 MG/2ML IJ SOLN: 4.0000 mg | Freq: Four times a day (QID) | INTRAMUSCULAR | Status: DC | PRN

## 2014-01-27 MED ORDER — SODIUM CHLORIDE 0.9 % IJ SOLN: 3.0000 mL | INTRAMUSCULAR | Status: DC | PRN

## 2014-01-27 MED ORDER — ASPIRIN 325 MG PO TABS
325.0000 mg | ORAL_TABLET | Freq: Every day | ORAL | Status: DC
  Administered 2014-01-27 – 2014-01-28 (×2): 325 mg via ORAL
  Filled 2014-01-27 (×2): qty 1

## 2014-01-27 MED ORDER — ENOXAPARIN SODIUM 30 MG/0.3ML ~~LOC~~ SOLN
30.0000 mg | SUBCUTANEOUS | Status: DC
  Administered 2014-01-27 – 2014-01-28 (×2): 30 mg via SUBCUTANEOUS
  Filled 2014-01-27 (×3): qty 0.3

## 2014-01-27 MED ORDER — HYDROMORPHONE HCL PF 1 MG/ML IJ SOLN: 0.5000 mg | INTRAMUSCULAR | Status: DC | PRN

## 2014-01-27 MED ORDER — VITAMIN D (CHOLECALCIFEROL) 25 MCG (1000 UT) PO TABS: 1000.0000 [IU] | ORAL_TABLET | Freq: Every day | ORAL | Status: DC

## 2014-01-27 MED ORDER — POLYETHYLENE GLYCOL 3350 17 G PO PACK: 17.0000 g | PACK | Freq: Every day | ORAL | Status: DC | PRN

## 2014-01-27 MED ORDER — SODIUM CHLORIDE 0.9 % IJ SOLN
3.0000 mL | Freq: Two times a day (BID) | INTRAMUSCULAR | Status: DC
  Administered 2014-01-27: 3 mL via INTRAVENOUS

## 2014-01-27 MED ORDER — SIMVASTATIN 20 MG PO TABS
20.0000 mg | ORAL_TABLET | Freq: Every day | ORAL | Status: DC
  Administered 2014-01-27 – 2014-01-28 (×2): 20 mg via ORAL
  Filled 2014-01-27 (×2): qty 1

## 2014-01-27 MED ORDER — VITAMIN D3 25 MCG (1000 UNIT) PO TABS
1000.0000 [IU] | ORAL_TABLET | Freq: Every day | ORAL | Status: DC
  Administered 2014-01-27 – 2014-01-28 (×2): 1000 [IU] via ORAL
  Filled 2014-01-27 (×2): qty 1

## 2014-01-27 MED ORDER — ONDANSETRON HCL 4 MG PO TABS: 4.0000 mg | ORAL_TABLET | Freq: Four times a day (QID) | ORAL | Status: DC | PRN

## 2014-01-27 MED ORDER — CYANOCOBALAMIN 250 MCG PO TABS
250.0000 ug | ORAL_TABLET | Freq: Every day | ORAL | Status: DC
  Administered 2014-01-27 – 2014-01-28 (×2): 250 ug via ORAL
  Filled 2014-01-27 (×2): qty 1

## 2014-01-27 MED ORDER — NITROGLYCERIN 0.4 MG SL SUBL
0.4000 mg | SUBLINGUAL_TABLET | SUBLINGUAL | Status: DC | PRN
  Administered 2014-01-27 (×2): 0.4 mg via SUBLINGUAL
  Filled 2014-01-27: qty 1

## 2014-01-27 MED ORDER — PNEUMOCOCCAL VAC POLYVALENT 25 MCG/0.5ML IJ INJ
0.5000 mL | INJECTION | INTRAMUSCULAR | Status: AC
  Administered 2014-01-28: 0.5 mL via INTRAMUSCULAR
  Filled 2014-01-27: qty 0.5

## 2014-01-27 MED ORDER — OXYCODONE HCL 5 MG PO TABS
5.0000 mg | ORAL_TABLET | ORAL | Status: DC | PRN
  Filled 2014-01-27 (×6): qty 1

## 2014-01-27 MED ORDER — ALENDRONATE SODIUM 70 MG PO TABS: 70.0000 mg | ORAL_TABLET | ORAL | Status: DC

## 2014-01-27 MED ORDER — ASPIRIN 325 MG PO TABS: 325.0000 mg | ORAL_TABLET | Freq: Once | ORAL | Status: DC

## 2014-01-27 MED ORDER — PANTOPRAZOLE SODIUM 40 MG PO TBEC
40.0000 mg | DELAYED_RELEASE_TABLET | Freq: Every day | ORAL | Status: DC
  Administered 2014-01-27 – 2014-01-28 (×2): 40 mg via ORAL
  Filled 2014-01-27 (×2): qty 1

## 2014-01-27 MED ORDER — ACETAMINOPHEN 325 MG PO TABS
650.0000 mg | ORAL_TABLET | Freq: Four times a day (QID) | ORAL | Status: DC | PRN
  Administered 2014-01-28: 650 mg via ORAL
  Filled 2014-01-27: qty 2

## 2014-01-27 MED ORDER — ALUM & MAG HYDROXIDE-SIMETH 200-200-20 MG/5ML PO SUSP: 30.0000 mL | Freq: Four times a day (QID) | ORAL | Status: DC | PRN

## 2014-01-27 MED ORDER — SODIUM CHLORIDE 0.9 % IJ SOLN
3.0000 mL | Freq: Two times a day (BID) | INTRAMUSCULAR | Status: DC
  Administered 2014-01-28: 3 mL via INTRAVENOUS

## 2014-01-27 MED ORDER — VITAMIN B-12 250 MCG PO TABS: 250.0000 ug | ORAL_TABLET | Freq: Every day | ORAL | Status: DC

## 2014-01-27 NOTE — Progress Notes (Signed)
UR completed 

## 2014-01-27 NOTE — ED Provider Notes (Signed)
CSN: [id number]     Arrival date & time 01/27/14  7001 History   First MD Initiated Contact with Patient 01/27/14 0423     Chief Complaint  Patient presents with  . Chest Pain     (Consider location/radiation/quality/duration/timing/severity/associated sxs/prior Treatment) HPI  Sheila West is an 80yo female with a PMH of hypertension here with sudden onset chest pain that woke her up out of sleep.  It did not radiate, and was associated with SOB, diaphoresis and nausea.  She was given NTG and ASA by EMS which helped relieve her pain.  Patient denies having pain like this prior.  She is still currently having the chest pain but it is down to a 3.  She denies recent fevers or coughing.  There has been no changes to her urine or bowel movements.  Past Medical History  Diagnosis Date  . Hypertension   . Melanoma     "I've had multile melanomas removed"  . Intracranial meningioma    Past Surgical History  Procedure Laterality Date  . Abdominal hysterectomy    . Replacement total knee Right    No family history on file. History  Substance Use Topics  . Smoking status: Never Smoker   . Smokeless tobacco: Not on file  . Alcohol Use: No   OB History   Grav Para Term Preterm Abortions TAB SAB Ect Mult Living                 Review of Systems 10 Systems reviewed and are negative for acute change except as noted in the HPI.    Allergies  Review of patient's allergies indicates no known allergies.  Home Medications   Prior to Admission medications   Medication Sig Start Date End Date Taking? Authorizing Provider  alendronate (FOSAMAX) 70 MG tablet Take 70 mg by mouth once a week. Take with a full glass of water on an empty stomach.   Yes Historical Provider, MD  Calcium Carb-Cholecalciferol (CALCIUM 600 + D PO) Take 1 tablet by mouth daily.   Yes Historical Provider, MD  Coenzyme Q10 (CO Q-10) 50 MG CAPS Take 50 mg by mouth daily.   Yes Historical Provider, MD   hydrochlorothiazide (HYDRODIURIL) 25 MG tablet Take 25 mg by mouth daily.   Yes Historical Provider, MD  polyethylene glycol (MIRALAX / GLYCOLAX) packet Take 17 g by mouth daily as needed for moderate constipation. 08/19/13  Yes Saddie Benders. Ghim, MD  simvastatin (ZOCOR) 20 MG tablet Take 20 mg by mouth daily.   Yes Historical Provider, MD  vitamin B-12 (CYANOCOBALAMIN) 250 MCG tablet Take 250 mcg by mouth daily.   Yes Historical Provider, MD  Vitamin D, Cholecalciferol, 1000 UNITS TABS Take 1,000 Units by mouth daily.   Yes Historical Provider, MD   BP 142/63  Pulse 73  Temp(Src) 98 F (36.7 C) (Oral)  Resp 15  Ht 5\' 4"  (1.626 m)  Wt 130 lb 15.3 oz (59.4 kg)  BMI 22.47 kg/m2  SpO2 96% Physical Exam  Nursing note and vitals reviewed. Constitutional: She is oriented to person, place, and time. She appears well-developed and well-nourished. No distress.  HENT:  Head: Normocephalic and atraumatic.  Nose: Nose normal.  Mouth/Throat: Oropharynx is clear and moist. No oropharyngeal exudate.  Eyes: Conjunctivae and EOM are normal. Pupils are equal, round, and reactive to light. No scleral icterus.  Neck: Normal range of motion. Neck supple. No JVD present. No tracheal deviation present. No thyromegaly present.  Cardiovascular: Normal rate,  regular rhythm and normal heart sounds.  Exam reveals no gallop and no friction rub.   No murmur heard. Pulmonary/Chest: Effort normal and breath sounds normal. No respiratory distress. She has no wheezes. She exhibits no tenderness.  Abdominal: Soft. Bowel sounds are normal. She exhibits no distension and no mass. There is no tenderness. There is no rebound and no guarding.  Musculoskeletal: Normal range of motion. She exhibits no edema and no tenderness.  Lymphadenopathy:    She has no cervical adenopathy.  Neurological: She is alert and oriented to person, place, and time.  Skin: Skin is warm and dry. No rash noted. No erythema. No pallor.    ED  Course  Procedures (including critical care time) Labs Review Labs Reviewed  BASIC METABOLIC PANEL - Abnormal; Notable for the following:    Potassium 3.5 (*)    Glucose, Bld 109 (*)    GFR calc non Af Amer 53 (*)    GFR calc Af Amer 61 (*)    Anion gap 16 (*)    All other components within normal limits  MRSA PCR SCREENING  CBC WITH DIFFERENTIAL  TROPONIN I  TROPONIN I  TROPONIN I  HEPATIC FUNCTION PANEL  Randolm Idol, ED    Imaging Review Dg Chest Port 1 View  01/27/2014   CLINICAL DATA:  Patient woke up with chest pain this morning.  EXAM: PORTABLE CHEST - 1 VIEW  COMPARISON:  08/1913  FINDINGS: Hyperinflation suggesting emphysema. Normal heart size and pulmonary vascularity. No focal airspace disease or consolidation in the lungs. No blunting of costophrenic angles. No pneumothorax. Mediastinal contours appear intact. Degenerative changes in the spine and shoulders. No significant change since previous study.  IMPRESSION: No active disease.   Electronically Signed   By: Lucienne Capers M.D.   On: 01/27/2014 04:43     EKG Interpretation None    MUSE not available: EMS rhythm strip reveals NSR with STD in v4-v6.  EKG performed in the ED reveals NSR and STD have resolved.No signs of acute ischemia  MDM   Final diagnoses:  Chest pain, unspecified chest pain type    Patient presents with concerns for chest pain.  She has risk factors for ACS including her history, age, h/o HTN, and concerning EKG findings on EMS strip.  She was given NTG in the ED as well to help control her chest pain.  She will be retained in the step down unit and I hope she will get a stress test.  BP is slightly elevated in the ED, but there is no AMS.  Patient safe for transfer.    Everlene Balls, MD 01/27/14 1258

## 2014-01-27 NOTE — Progress Notes (Signed)
Patient admitted earlier today. H&P reviewed.  Denies any chest pain currently. She felt lot of gas in her abdomen earlier today which has also resolved. Denies any abdominal pain. Denies heartburns.  VSS Lungs are CTA bilaterally Heart: S1S2 normal regular, no s3 s4 no rubs murmurs or bruits. No pedal edema. Abdomen: Soft, NT ND BS present. No masses or organomegaly. Alert and oriented x 3. No focal deficits.  Labs and xray reports reviewed.  Patient here with chest pain that woke her up from sleep. Had lot of 'gas' earlier today. EKG was non ischemic. Trop normal so far. Cp has resolved. Will stop NTG. Await ECHO report. Continue to obtain serial trops. Check LFT. Pain could have been GI in origin. Continue PPI.  Jarrod Mcenery 01/27/2014 12:51 PM

## 2014-01-27 NOTE — Progress Notes (Signed)
Nutrition Brief Note  Patient identified on the Malnutrition Screening Tool (MST) Report  Wt Readings from Last 15 Encounters:  01/27/14 130 lb 15.3 oz (59.4 kg)    Body mass index is 22.47 kg/(m^2). Patient meets criteria for normal weight based on current BMI.   Current diet order is heart healthy, patient is consuming approximately 100% of meals at this time. Labs and medications reviewed. Pt with history of HTN admitted with substernal CP and epigastric pain at an 8/10 that awakened her from sleeping tonight at 2:30 AM. The pain was associated with SOB and nausea and diaphoresis.  - Met with pt who reports losing 15 pounds unintentionally around Christmas time but has maintained her weight since then - Eats 3 meals/day plus snacks at home - Eating 100% of meals   No nutrition interventions warranted at this time. If nutrition issues arise, please consult RD.   Carlis Stable MS, RD, LDN [phone] Pager [phone] Weekend/After Hours Pager'

## 2014-01-27 NOTE — ED Notes (Signed)
Per EMS, pt woke up with sudden onset of midsternal non-radiating CP. Pt denied SOB and nausea. Pts skin cool and dry. NAD at this time. EMS gave 1 nitro and 324 of aspirin in route. This brought the pts pain to an 8 out of 10.

## 2014-01-27 NOTE — Progress Notes (Signed)
  Echocardiogram 2D Echocardiogram has been performed.  Diamond Nickel 01/27/2014, 11:48 AM

## 2014-01-27 NOTE — H&P (Signed)
[facility] Admission History and Physical       Sheila West ZJI:[id number] DOB: 15-Jun-1929 DOA: 01/27/2014  Referring physician:  EDP PCP: Horton Finer, MD  Specialists:   Chief Complaint: Chest Pain   HPI: Sheila West is a 80 y.o. female with a history of HTN presenting to the Ed with complaints of Substernal  Chest Pain and Epigastric Pain at an 8/10 that awakened her from sleeping tonight at 2:30 AM.  The pain was associated with SOB and Nausea and Diaphoresis.  The pain improved but did not resolve completely with administration of SL NTG.  The pain did decrease to a 3/10.  The initial cardiac workup in the ED was negative and she was referred for medical admission.         Review of Systems:  Constitutional: No Weight Loss, No Weight Gain, Night Sweats, Fevers, Chills, Dizziness, Fatigue, or Generalized Weakness HEENT: No Headaches, Difficulty Swallowing,Tooth/Dental Problems,Sore Throat,  No Sneezing, Rhinitis, Ear Ache, Nasal Congestion, or Post Nasal Drip,  Cardio-vascular:  No Chest pain, Orthopnea, PND, Edema in Lower Extremities, Anasarca, Dizziness, Palpitations  Resp: No Dyspnea, No DOE, No Productive Cough, No Non-Productive Cough, No Hemoptysis, No Wheezing.    GI: No Heartburn, Indigestion, Abdominal Pain, Nausea, Vomiting, Diarrhea, Hematemesis, Hematochezia, Melena, Change in Bowel Habits,  Loss of Appetite  GU: No Dysuria, Change in Color of Urine, No Urgency or Frequency, No Flank pain.  Musculoskeletal: No Joint Pain or Swelling, No Decreased Range of Motion, No Back Pain.  Neurologic: No Syncope, No Seizures, Muscle Weakness, Paresthesia, Vision Disturbance or Loss, No Diplopia, No Vertigo, No Difficulty Walking,  Skin: No Rash or Lesions. Psych: No Change in Mood or Affect, No Depression or Anxiety, No Memory loss, No Confusion, or Hallucinations   Past Medical History  Diagnosis Date  . Hypertension   . Melanoma     "I've had  multile melanomas removed"  . Intracranial meningioma       Past Surgical History  Procedure Laterality Date  . Abdominal hysterectomy    . Replacement total knee Right        Prior to Admission medications   Medication Sig Start Date End Date Taking? Authorizing Provider  alendronate (FOSAMAX) 70 MG tablet Take 70 mg by mouth once a week. Take with a full glass of water on an empty stomach.   Yes Historical Provider, MD  Calcium Carb-Cholecalciferol (CALCIUM 600 + D PO) Take 1 tablet by mouth daily.   Yes Historical Provider, MD  Coenzyme Q10 (CO Q-10) 50 MG CAPS Take 50 mg by mouth daily.   Yes Historical Provider, MD  hydrochlorothiazide (HYDRODIURIL) 25 MG tablet Take 25 mg by mouth daily.   Yes Historical Provider, MD  polyethylene glycol (MIRALAX / GLYCOLAX) packet Take 17 g by mouth daily as needed for moderate constipation. 08/19/13  Yes Saddie Benders. Ghim, MD  simvastatin (ZOCOR) 20 MG tablet Take 20 mg by mouth daily.   Yes Historical Provider, MD  vitamin B-12 (CYANOCOBALAMIN) 250 MCG tablet Take 250 mcg by mouth daily.   Yes Historical Provider, MD  Vitamin D, Cholecalciferol, 1000 UNITS TABS Take 1,000 Units by mouth daily.   Yes Historical Provider, MD      No Known Allergies   Social History:  reports that she has never smoked. She does not have any smokeless tobacco history on file. She reports that she does not drink alcohol. Her drug history is not on file.     No  family history on file.     Physical Exam:  GEN:  Pleasant Elderly Well Nourished and Well Developed 80 y.o. Caucasian female examined and in no acute distress; cooperative with exam Filed Vitals:   01/27/14 0445 01/27/14 0500 01/27/14 0515 01/27/14 0530  BP: 142/73 135/72 142/67 132/70  Pulse: 104 100 90 83  Temp:      TempSrc:      Resp: 13 13 20 14   Height:      SpO2: 99% 96% 99% 97%   Blood pressure 132/70, pulse 83, temperature 97.6 F (36.4 C), temperature source Oral, resp. rate 14,  height 5\' 4"  (1.626 m), SpO2 97.00%. PSYCH: She is alert and oriented x4; does not appear anxious does not appear depressed; affect is normal HEENT: Normocephalic and Atraumatic, Mucous membranes pink; PERRLA; EOM intact; Fundi:  Benign;  No scleral icterus, Nares: Patent, Oropharynx: Clear, Edentulous,    Neck:  FROM, No Cervical Lymphadenopathy nor Thyromegaly or Carotid Bruit; No JVD; Breasts:: Not examined CHEST WALL: No tenderness CHEST: Normal respiration, clear to auscultation bilaterally HEART: Regular rate and rhythm; no murmurs rubs or gallops BACK: No kyphosis or scoliosis; No CVA tenderness ABDOMEN: Positive Bowel Sounds,  Soft Non-Tender; No Masses, No Organomegaly, Rectal Exam: Not done EXTREMITIES: No Cyanosis, Clubbing, or Edema; No Ulcerations. Genitalia: not examined PULSES: 2+ and symmetric SKIN: Normal hydration no rash or ulceration CNS:  Alert and Oriented x 4,  No Focal Deficits   Vascular: pulses palpable throughout    Labs on Admission:  Basic Metabolic Panel:  Recent Labs Lab 01/27/14 0410  NA 139  K 3.5*  CL 98  CO2 25  GLUCOSE 109*  BUN 19  CREATININE 0.98  CALCIUM 10.1   Liver Function Tests: No results found for this basename: AST, ALT, ALKPHOS, BILITOT, PROT, ALBUMIN,  in the last 168 hours No results found for this basename: LIPASE, AMYLASE,  in the last 168 hours No results found for this basename: AMMONIA,  in the last 168 hours CBC:  Recent Labs Lab 01/27/14 0410  WBC 5.5  NEUTROABS 3.7  HGB 13.1  HCT 38.6  MCV 89.1  PLT 240   Cardiac Enzymes: No results found for this basename: CKTOTAL, CKMB, CKMBINDEX, TROPONINI,  in the last 168 hours  BNP (last 3 results) No results found for this basename: PROBNP,  in the last 8760 hours CBG: No results found for this basename: GLUCAP,  in the last 168 hours  Radiological Exams on Admission: Dg Chest Port 1 View  01/27/2014   CLINICAL DATA:  Patient woke up with chest pain this  morning.  EXAM: PORTABLE CHEST - 1 VIEW  COMPARISON:  08/1913  FINDINGS: Hyperinflation suggesting emphysema. Normal heart size and pulmonary vascularity. No focal airspace disease or consolidation in the lungs. No blunting of costophrenic angles. No pneumothorax. Mediastinal contours appear intact. Degenerative changes in the spine and shoulders. No significant change since previous study.  IMPRESSION: No active disease.   Electronically Signed   By: Lucienne Capers M.D.   On: 01/27/2014 04:43     EKG: Independently reviewed. Normal Sinus Rhythm rate =72    Assessment/Plan:   80 y.o. female with   Principal Problem:   1.    Chest pain   Telemetry Monitoring   Cycle Troponins   Placed on Nitropaste, O2 , and ASA Rx, already on Statin Rx   Check Fasting LIpids   2D ECHO ordered  Active Problems:   2.    Hypertension  Continue HCTZ   3. Hyperlipidemia   Continue Statin Rx.      Check Lipids Panel  4.  DVT Prophylaxis   Lovenox.          Code Status:    FULL CODE   Family Communication:   Son at Bedside Disposition Plan:   Observation      Time spent: [place] [facility] Pager [phone]   If [place] Please Contact the Day Rounding Team MD for [facility]  If 7PM-7AM, Please Contact night-coverage  [email or web address] Password [email or web address] 01/27/2014, 6:21 AM

## 2014-01-28 LAB — COMPREHENSIVE METABOLIC PANEL
ALT: 8 U/L (ref 0–35)
ANION GAP: 16 — AB (ref 5–15)
AST: 13 U/L (ref 0–37)
Albumin: 3.5 g/dL (ref 3.5–5.2)
Alkaline Phosphatase: 53 U/L (ref 39–117)
BUN: 20 mg/dL (ref 6–23)
CALCIUM: 9.4 mg/dL (ref 8.4–10.5)
CHLORIDE: 100 meq/L (ref 96–112)
CO2: 24 mEq/L (ref 19–32)
CREATININE: 0.97 mg/dL (ref 0.50–1.10)
GFR calc Af Amer: 62 mL/min — ABNORMAL LOW (ref >90)
GFR calc non Af Amer: 53 mL/min — ABNORMAL LOW (ref >90)
Glucose, Bld: 111 mg/dL — ABNORMAL HIGH (ref 70–99)
Potassium: 3.8 mEq/L (ref 3.7–5.3)
Sodium: 140 mEq/L (ref 137–147)
TOTAL PROTEIN: 6.9 g/dL (ref 6.0–8.3)
Total Bilirubin: 0.3 mg/dL (ref 0.3–1.2)

## 2014-01-28 LAB — CBC
HCT: 38.9 % (ref 36.0–46.0)
Hemoglobin: 13.2 g/dL (ref 12.0–15.0)
MCH: 30.6 pg (ref 26.0–34.0)
MCHC: 33.9 g/dL (ref 30.0–36.0)
MCV: 90 fL (ref 78.0–100.0)
PLATELETS: 258 10*3/uL (ref 150–400)
RBC: 4.32 MIL/uL (ref 3.87–5.11)
RDW: 12.9 % (ref 11.5–15.5)
WBC: 8.4 10*3/uL (ref 4.0–10.5)

## 2014-01-28 MED ORDER — ASPIRIN EC 81 MG PO TBEC: 81.0000 mg | DELAYED_RELEASE_TABLET | Freq: Every day | ORAL | Status: DC

## 2014-01-28 MED ORDER — OMEPRAZOLE 20 MG PO CPDR: 20.0000 mg | DELAYED_RELEASE_CAPSULE | Freq: Every day | ORAL | Status: DC

## 2014-01-28 NOTE — Progress Notes (Signed)
Pt. Discharged home with family, discharge instructions given and all questions answered and addressed. Prescription information provided. Pt. Awake, alert and oriented upon discharge. IV removed. Telemetry box disconnected and CCMD notified.

## 2014-01-28 NOTE — Progress Notes (Signed)
UR completed 

## 2014-01-28 NOTE — Discharge Instructions (Signed)

## 2014-01-28 NOTE — Discharge Summary (Signed)
[facility]  Physician Discharge Summary   Patient ID: Sheila West MRN: [id number] DOB/AGE: 78-Mar-1931 78 y.o.  Admit date: 01/27/2014 Discharge date: 01/28/2014  PCP: Valaria Good, MD  DISCHARGE DIAGNOSES:  Principal Problem:   Chest pain Active Problems:   Hypertension   Hyperlipidemia   RECOMMENDATIONS FOR OUTPATIENT FOLLOW UP: 1. Needs close follow up with PCP  DISCHARGE CONDITION: fair  Diet recommendation: Heart Healthy  Filed Weights   01/27/14 0645  Weight: 59.4 kg (130 lb 15.3 oz)    INITIAL HISTORY: Sheila West is a 80 y.o. female with a history of HTN who presented to the ED with complaints of Substernal Chest Pain and Epigastric Pain at an 8/10 that awakened her from sleeping tonight at 2:30 AM. The pain was associated with SOB and Nausea and Diaphoresis. The pain improved but did not resolve completely with administration of SL NTG. The pain did decrease to a 3/10. The initial cardiac workup in the ED was negative and she was referred for medical admission.   Consultations:  None  Procedures: 2D ECHO 8/27 Study Conclusions - Left ventricle: The cavity size was normal. There was mild concentric hypertrophy. Systolic function was normal. The estimated ejection fraction was in the range of 60% to 65%. Wall motion was normal; there were no regional wall motion abnormalities. Doppler parameters are consistent with abnormal left ventricular relaxation (grade 1 diastolic dysfunction). - Aortic valve: There was mild regurgitation. - Mitral valve: Mildly to moderately calcified annulus.   HOSPITAL COURSE:   She was admitted to the hospital and troponins were obtained serially. These were normal. Her pain resolved. She reported a lot of gas as well. Reports heart burns once in a while. ECHO did not show any wall motion abnormalities. LFT's were normal. Abdomen was benign. She was feeling much better this AM and wished to go home. EKG was non  ischemic. Etiology remains unclear but could be GI in origin. She was instructed on how to take Fosamax as that medication can cause GI issues if not taken correctly. She was given prescriptions for Aspirin and Omeprazole. She was asked to follow up with her PCP early next week for further evaluation.  Her other medical issues were stable. She was ambulating without difficulty.  She was considered stable for discharge.  PERTINENT LABS:  The results of significant diagnostics from this hospitalization (including imaging, microbiology, ancillary and laboratory) are listed below for reference.    Microbiology: Recent Results (from the past 240 hour(s))  MRSA PCR SCREENING     Status: None   Collection Time    01/27/14  8:38 AM      Result Value Ref Range Status   MRSA by PCR NEGATIVE  NEGATIVE Final   Comment:            The GeneXpert MRSA Assay (FDA     approved for NASAL specimens     only), is one component of a     comprehensive MRSA colonization     surveillance program. It is not     intended to diagnose MRSA     infection nor to guide or     monitor treatment for     MRSA infections.     Labs: Basic Metabolic Panel:  Recent Labs Lab 01/27/14 0410 01/28/14 0345  NA 139 140  K 3.5* 3.8  CL 98 100  CO2 25 24  GLUCOSE 109* 111*  BUN 19 20  CREATININE 0.98 0.97  CALCIUM  10.1 9.4   Liver Function Tests:  Recent Labs Lab 01/27/14 1340 01/28/14 0345  AST 15 13  ALT 9 8  ALKPHOS 57 53  BILITOT 0.4 0.3  PROT 7.5 6.9  ALBUMIN 3.9 3.5   CBC:  Recent Labs Lab 01/27/14 0410 01/28/14 0345  WBC 5.5 8.4  NEUTROABS 3.7  --   HGB 13.1 13.2  HCT 38.6 38.9  MCV 89.1 90.0  PLT 240 258   Cardiac Enzymes:  Recent Labs Lab 01/27/14 0709 01/27/14 1255 01/27/14 1916  TROPONINI <0.30 <0.30 <0.30    IMAGING STUDIES Dg Chest Port 1 View  01/27/2014   CLINICAL DATA:  Patient woke up with chest pain this morning.  EXAM: PORTABLE CHEST - 1 VIEW  COMPARISON:   08/1913  FINDINGS: Hyperinflation suggesting emphysema. Normal heart size and pulmonary vascularity. No focal airspace disease or consolidation in the lungs. No blunting of costophrenic angles. No pneumothorax. Mediastinal contours appear intact. Degenerative changes in the spine and shoulders. No significant change since previous study.  IMPRESSION: No active disease.   Electronically Signed   By: Lucienne Capers M.D.   On: 01/27/2014 04:43    DISCHARGE EXAMINATION: Filed Vitals:   01/28/14 0000 01/28/14 0204 01/28/14 0325 01/28/14 0400  BP: 168/81 140/66 126/64 127/59  Pulse: 82 72 76 70  Temp:   98.1 F (36.7 C)   TempSrc:   Oral   Resp: 18 14 13 14   Height:      Weight:      SpO2: 97% 99% 97% 97%   General appearance: alert, cooperative and no distress Resp: clear to auscultation bilaterally Cardio: regular rate and rhythm, S1, S2 normal, no murmur, click, rub or gallop GI: soft, non-tender; bowel sounds normal; no masses,  no organomegaly  DISPOSITION: Home  Discharge Instructions   Diet - low sodium heart healthy    Complete by:  As directed      Discharge instructions    Complete by:  As directed   Take Fosamax as instructed. Be sure to follow up with your doctor early next week.     Increase activity slowly    Complete by:  As directed            ALLERGIES: No Known Allergies  Current Discharge Medication List    START taking these medications   Details  aspirin EC 81 MG tablet Take 1 tablet (81 mg total) by mouth daily. Qty: 30 tablet, Refills: 0    omeprazole (PRILOSEC) 20 MG capsule Take 1 capsule (20 mg total) by mouth at bedtime. Qty: 30 capsule, Refills: 1      CONTINUE these medications which have NOT CHANGED   Details  alendronate (FOSAMAX) 70 MG tablet Take 70 mg by mouth once a week. Take with a full glass of water on an empty stomach.    Calcium Carb-Cholecalciferol (CALCIUM 600 + D PO) Take 1 tablet by mouth daily.    Coenzyme Q10 (CO Q-10)  50 MG CAPS Take 50 mg by mouth daily.    hydrochlorothiazide (HYDRODIURIL) 25 MG tablet Take 25 mg by mouth daily.    polyethylene glycol (MIRALAX / GLYCOLAX) packet Take 17 g by mouth daily as needed for moderate constipation. Qty: 7 each, Refills: 0    simvastatin (ZOCOR) 20 MG tablet Take 20 mg by mouth daily.    vitamin B-12 (CYANOCOBALAMIN) 250 MCG tablet Take 250 mcg by mouth daily.    Vitamin D, Cholecalciferol, 1000 UNITS TABS Take 1,000 Units by  mouth daily.       Follow-up Information   Follow up with Valaria Good, MD. Schedule an appointment as soon as possible for a visit in 5 days. (post hospitalization follow up)    Specialty:  Internal Medicine   Contact information:   [address] [place] [address] [phone]       TOTAL DISCHARGE TIME: 35 mins  [facility]  [facility] Pager [phone]  01/28/2014, 7:30 AM  Disclaimer: This note was dictated with voice recognition software. Similar sounding words can inadvertently be transcribed and may not be corrected upon review.

## 2014-03-04 ENCOUNTER — Ambulatory Visit (INDEPENDENT_AMBULATORY_CARE_PROVIDER_SITE_OTHER): Payer: Medicare Other | Admitting: Cardiology

## 2014-03-04 ENCOUNTER — Encounter: Payer: Self-pay | Admitting: Cardiology

## 2014-03-04 VITALS — BP 168/58 | HR 69 | Ht 62.0 in | Wt 133.0 lb

## 2014-03-04 DIAGNOSIS — R0789 Other chest pain: Secondary | ICD-10-CM

## 2014-03-04 DIAGNOSIS — E7800 Pure hypercholesterolemia, unspecified: Secondary | ICD-10-CM

## 2014-03-04 DIAGNOSIS — I10 Essential (primary) hypertension: Secondary | ICD-10-CM

## 2014-03-04 NOTE — Progress Notes (Signed)
Sheila West Date of Birth:  May 03, 1930 [place] [address] [place] [address] [place], [place]  [address] [phone]        Fax   [phone]   History of Present Illness: This pleasant 80 year old woman is seen by me for the first time today.  She is seen at the request of Dr. Dorian Heckle for evaluation of chest pain.  The patient does not have any history of known heart disease she was admitted to the hospital on 01/27/14 and discharged on 01/28/14.  She was admitted because of chest pain and epigastric pain which awakened her from sleep at 2:30 in the morning.  There was associated shortness of breath nausea and diaphoresis.  She went to the emergency room where she was evaluated and admitted.  Serial troponins were normal.  EKG was nonacute.  Echocardiogram showed no wall motion abnormality and her ejection fraction was 60-65%.  The date of her echo was 01/27/14.  Since discharge from the hospital she has been treated empirically with omeprazole.  She has not been experiencing any further episodes of severe chest discomfort.  She does admit to occasional chest tightness when she walks.  She also has occasional dizzy spells.  She tends to be physically active.  She enjoys gardening and mowing her grass. The patient is a nonsmoker.  She does have a history of high blood pressure and hypercholesterolemia.  She is not diabetic.  Her family history is negative for premature coronary disease. Her past medical history is also notable for a benign tumor of the left brain for which she is followed by Dr. Cyndy Freeze.  According to the patient and no surgery is anticipated.  Current Outpatient Prescriptions  Medication Sig Dispense Refill  . alendronate (FOSAMAX) 70 MG tablet Take 70 mg by mouth once a week. Take with a full glass of water on an empty stomach.      Marland Kitchen aspirin EC 81 MG tablet Take 1 tablet (81 mg total) by mouth daily.  30 tablet  0  . Calcium Carb-Cholecalciferol (CALCIUM 600  + D PO) Take 1 tablet by mouth daily.      . Coenzyme Q10 (CO Q-10) 50 MG CAPS Take 50 mg by mouth daily.      . hydrochlorothiazide (HYDRODIURIL) 25 MG tablet Take 25 mg by mouth daily.      Marland Kitchen omeprazole (PRILOSEC) 20 MG capsule Take 1 capsule (20 mg total) by mouth at bedtime.  30 capsule  1  . polyethylene glycol (MIRALAX / GLYCOLAX) packet Take 17 g by mouth daily as needed for moderate constipation.  7 each  0  . simvastatin (ZOCOR) 20 MG tablet Take 20 mg by mouth daily.      . vitamin B-12 (CYANOCOBALAMIN) 250 MCG tablet Take 250 mcg by mouth daily.      . Vitamin D, Cholecalciferol, 1000 UNITS TABS Take 1,000 Units by mouth daily.       No current facility-administered medications for this visit.    No Known Allergies  Patient Active Problem List   Diagnosis Date Noted  . Chest pain 01/27/2014  . Hyperlipidemia 01/27/2014  . Hypertension     History  Smoking status  . Never Smoker   Smokeless tobacco  . Not on file    History  Alcohol Use No    No family history on file.  Review of Systems: Constitutional: no fever chills diaphoresis or fatigue or change in weight.  Head and neck:  no hearing loss, no epistaxis, no photophobia or visual disturbance. Respiratory: No cough, shortness of breath or wheezing. Cardiovascular: No  peripheral edema, palpitations.  Positive for chest discomfort Gastrointestinal: No abdominal distention, no abdominal pain, no change in bowel habits hematochezia or melena. Genitourinary: No dysuria, no frequency, no urgency, no nocturia. Musculoskeletal:No arthralgias, no back pain, no gait disturbance or myalgias. Neurological: No dizziness, no headaches, no numbness, no seizures, no syncope, no weakness, no tremors. Hematologic: No lymphadenopathy, no easy bruising. Psychiatric: No confusion, no hallucinations, no sleep disturbance.    Physical Exam: Filed Vitals:   03/04/14 1502  BP: 168/58  Pulse: 69  The patient appears to be in  no distress.  She is here with a friend from her church who brought her to the office.  Head and neck exam reveals that the pupils are equal and reactive.  The extraocular movements are full.  There is no scleral icterus.  Mouth and pharynx are benign.  No lymphadenopathy.  No carotid bruits.  The jugular venous pressure is normal.  Thyroid is not enlarged or tender.  Chest is clear to percussion and auscultation.  No rales or rhonchi.  Expansion of the chest is symmetrical.  Heart reveals no abnormal lift or heave.  First and second heart sounds are normal.  There is no murmur gallop rub or click.  The abdomen is soft and nontender.  Bowel sounds are normoactive.  There is no hepatosplenomegaly or mass.  There are no abdominal bruits.  Extremities reveal no phlebitis or edema.  Pedal pulses are good.  There is no cyanosis or clubbing.  Neurologic exam is normal strength and no lateralizing weakness.  No sensory deficits.  Integument reveals no rash  EKG today shows normal sinus rhythm and is within normal limits  Assessment / Plan: 1.  Atypical chest pain, uncertain etiology.  It may turn out to be secondary to GI disease such as reflux 2. multiple risk factors for coronary disease including hypertension and hypercholesterolemia 3. essential hypertension with some element of white coat syndrome according to the patient 4. history of benign tumor of left brain followed by Dr. Cyndy Freeze  Disposition: We will have the patient return for a Lexi- scan Myoview stress test.  She should continue her omeprazole and other medications. Many thanks for the opportunity to see this pleasant woman with you.  I will keep you posted as to the results of her stress test.

## 2014-03-04 NOTE — Patient Instructions (Signed)
Your physician recommends that you continue on your current medications as directed. Please refer to the Current Medication list given to you today.  Your physician has requested that you have a lexiscan myoview. For further information please visit [email or web address]. Please follow instruction sheet, as given.  Follow up as needed  

## 2014-03-10 ENCOUNTER — Ambulatory Visit (HOSPITAL_COMMUNITY): Payer: Medicare Other | Attending: Cardiology | Admitting: Radiology

## 2014-03-10 VITALS — BP 169/99 | Ht 62.0 in | Wt 130.0 lb

## 2014-03-10 DIAGNOSIS — R110 Nausea: Secondary | ICD-10-CM | POA: Diagnosis not present

## 2014-03-10 DIAGNOSIS — R5383 Other fatigue: Secondary | ICD-10-CM | POA: Insufficient documentation

## 2014-03-10 DIAGNOSIS — R0600 Dyspnea, unspecified: Secondary | ICD-10-CM | POA: Insufficient documentation

## 2014-03-10 DIAGNOSIS — R0602 Shortness of breath: Secondary | ICD-10-CM | POA: Insufficient documentation

## 2014-03-10 DIAGNOSIS — R0789 Other chest pain: Secondary | ICD-10-CM

## 2014-03-10 DIAGNOSIS — R61 Generalized hyperhidrosis: Secondary | ICD-10-CM | POA: Insufficient documentation

## 2014-03-10 DIAGNOSIS — R42 Dizziness and giddiness: Secondary | ICD-10-CM | POA: Diagnosis not present

## 2014-03-10 DIAGNOSIS — R079 Chest pain, unspecified: Secondary | ICD-10-CM | POA: Insufficient documentation

## 2014-03-10 DIAGNOSIS — I10 Essential (primary) hypertension: Secondary | ICD-10-CM | POA: Diagnosis not present

## 2014-03-10 DIAGNOSIS — E7800 Pure hypercholesterolemia, unspecified: Secondary | ICD-10-CM

## 2014-03-10 MED ORDER — TECHNETIUM TC 99M SESTAMIBI GENERIC - CARDIOLITE
33.0000 | Freq: Once | INTRAVENOUS | Status: AC | PRN
  Administered 2014-03-10: 33 via INTRAVENOUS

## 2014-03-10 MED ORDER — REGADENOSON 0.4 MG/5ML IV SOLN
0.4000 mg | Freq: Once | INTRAVENOUS | Status: AC
  Administered 2014-03-10: 0.4 mg via INTRAVENOUS

## 2014-03-10 MED ORDER — TECHNETIUM TC 99M SESTAMIBI GENERIC - CARDIOLITE
11.0000 | Freq: Once | INTRAVENOUS | Status: AC | PRN
  Administered 2014-03-10: 11 via INTRAVENOUS

## 2014-03-10 NOTE — Progress Notes (Signed)
[facility] [address] [place],  [address] [phone]    Cardiology Nuclear Med Study  Sheila West is a 80 y.o. female     MRN : [id number]     DOB: Dec 14, 1929  Procedure Date: 03/10/2014  Nuclear Med Background Indication for Stress Test:  Evaluation for Ischemia History:  n/a Cardiac Risk Factors: Hypertension  Symptoms:  Chest Pain, Diaphoresis, Dizziness, DOE, Fatigue, Nausea and SOB   Nuclear Pre-Procedure Caffeine/Decaff Intake:  None> 12 hrs NPO After: 9:00pm   Lungs:  clear O2 Sat: 96% on room air. IV 0.9% NS with Angio Cath:  22g  IV Site: R Antecubital x 1, tolerated well IV Started by:  Irven Baltimore, RN  Chest Size (in):  36 Cup Size: A  Height: 5\' 2"  (1.575 m)  Weight:  130 lb (58.968 kg)  BMI:  Body mass index is 23.77 kg/(m^2). Tech Comments:  N/A    Nuclear Med Study 1 or 2 day study: 1 day  Stress Test Type:  Lexiscan  Reading MD: N/A  Order Authorizing Provider:  Darlin Coco, MD  Resting Radionuclide: Technetium 65m Sestamibi  Resting Radionuclide Dose: 11.0 mCi   Stress Radionuclide:  Technetium 70m Sestamibi  Stress Radionuclide Dose: 33.0 mCi           Stress Protocol Rest HR: 71 Stress HR: 111  Rest BP: 169/99 Stress BP: 127/74  Exercise Time (min): n/a METS: n/a   Predicted Max HR: 139 bpm % Max HR: 79.86 bpm Rate Pressure Product: 18759   Dose of Adenosine (mg):  n/a Dose of Lexiscan: 0.4 mg  Dose of Atropine (mg): n/a Dose of Dobutamine: n/a mcg/kg/min (at max HR)  Stress Test Technologist: Perrin Maltese, EMT-P  Nuclear Technologist:  Vedia Pereyra, CNMT     Rest Procedure:  Myocardial perfusion imaging was performed at rest 45 minutes following the intravenous administration of Technetium 2m Sestamibi. Rest ECG: NSR - Normal EKG  Stress Procedure:  The patient received IV Lexiscan 0.4 mg over 15-seconds.  Technetium 60m Sestamibi injected at 30-seconds. This patient had sob,  extreme fatigue, and a headache with the Lexiscan injection. Quantitative spect images were obtained after a 45 minute delay. Stress ECG: No significant change from baseline ECG  QPS Raw Data Images:  Normal; no motion artifact; normal heart/lung ratio. Stress Images:  Normal homogeneous uptake in all areas of the myocardium. Rest Images:  Normal homogeneous uptake in all areas of the myocardium. Subtraction (SDS):  No evidence of ischemia. Transient Ischemic Dilatation (Normal <1.22):  0.90 Lung/Heart Ratio (Normal <0.45):  0.30  Quantitative Gated Spect Images QGS EDV:  46 ml QGS ESV:  9 ml  Impression Exercise Capacity:  Lexiscan with no exercise. BP Response:  Normal blood pressure response. Clinical Symptoms:  No significant symptoms noted. ECG Impression:  No significant ST segment change suggestive of ischemia. Comparison with Prior Nuclear Study: No images to compare  Overall Impression:  Normal stress nuclear study.  LV Ejection Fraction: 80%.  LV Wall Motion:  NL LV Function; NL Wall Motion.   Thayer Headings, Brooke Bonito., MD, [facility] 03/10/2014, 5:17 PM [address]  [place] Pager [phone]

## 2014-03-14 ENCOUNTER — Telehealth: Payer: Self-pay | Admitting: *Deleted

## 2014-03-14 NOTE — Telephone Encounter (Signed)
Message copied by Earvin Hansen on Mon Mar 14, 2014  4:02 PM ------      Message from: Darlin Coco      Created: Fri Mar 11, 2014 12:21 PM       Please report.  Stress test is normal.EF 80%.  Send report to Dr. Dorian Heckle. ------

## 2014-03-14 NOTE — Telephone Encounter (Signed)
Advised patient of results and will send copy to Dr Viviano Simas

## 2014-06-07 ENCOUNTER — Other Ambulatory Visit: Payer: Self-pay | Admitting: Neurosurgery

## 2014-06-07 DIAGNOSIS — D329 Benign neoplasm of meninges, unspecified: Secondary | ICD-10-CM

## 2014-06-08 ENCOUNTER — Ambulatory Visit
Admission: RE | Admit: 2014-06-08 | Discharge: 2014-06-08 | Disposition: A | Discharge status: Home / Self Care | Payer: Medicare Other | Source: Ambulatory Visit | Attending: Neurosurgery | Admitting: Neurosurgery

## 2014-06-08 DIAGNOSIS — D329 Benign neoplasm of meninges, unspecified: Secondary | ICD-10-CM

## 2014-06-15 ENCOUNTER — Other Ambulatory Visit: Payer: Self-pay

## 2015-02-13 ENCOUNTER — Inpatient Hospital Stay (HOSPITAL_COMMUNITY): Payer: Medicare Other

## 2015-02-13 ENCOUNTER — Inpatient Hospital Stay (HOSPITAL_COMMUNITY): Admission: AD | Admit: 2015-02-13 | Payer: Medicare Other | Source: Ambulatory Visit | Admitting: Internal Medicine

## 2015-02-13 ENCOUNTER — Inpatient Hospital Stay (HOSPITAL_COMMUNITY)
Admission: AD | Admit: 2015-02-13 | Discharge: 2015-02-17 | DRG: 378 | Disposition: A | Discharge status: Home / Self Care | Payer: Medicare Other | Source: Ambulatory Visit | Attending: Internal Medicine | Admitting: Internal Medicine

## 2015-02-13 ENCOUNTER — Encounter (HOSPITAL_COMMUNITY): Payer: Self-pay | Admitting: General Practice

## 2015-02-13 DIAGNOSIS — Z7982 Long term (current) use of aspirin: Secondary | ICD-10-CM

## 2015-02-13 DIAGNOSIS — E785 Hyperlipidemia, unspecified: Secondary | ICD-10-CM | POA: Diagnosis present

## 2015-02-13 DIAGNOSIS — Z96651 Presence of right artificial knee joint: Secondary | ICD-10-CM | POA: Diagnosis present

## 2015-02-13 DIAGNOSIS — M7989 Other specified soft tissue disorders: Secondary | ICD-10-CM | POA: Diagnosis present

## 2015-02-13 DIAGNOSIS — D6489 Other specified anemias: Secondary | ICD-10-CM | POA: Insufficient documentation

## 2015-02-13 DIAGNOSIS — Z85820 Personal history of malignant melanoma of skin: Secondary | ICD-10-CM

## 2015-02-13 DIAGNOSIS — D320 Benign neoplasm of cerebral meninges: Secondary | ICD-10-CM | POA: Diagnosis present

## 2015-02-13 DIAGNOSIS — R531 Weakness: Secondary | ICD-10-CM | POA: Diagnosis present

## 2015-02-13 DIAGNOSIS — D500 Iron deficiency anemia secondary to blood loss (chronic): Secondary | ICD-10-CM | POA: Diagnosis present

## 2015-02-13 DIAGNOSIS — D329 Benign neoplasm of meninges, unspecified: Secondary | ICD-10-CM | POA: Diagnosis present

## 2015-02-13 DIAGNOSIS — F0390 Unspecified dementia without behavioral disturbance: Secondary | ICD-10-CM | POA: Diagnosis present

## 2015-02-13 DIAGNOSIS — K219 Gastro-esophageal reflux disease without esophagitis: Secondary | ICD-10-CM | POA: Diagnosis present

## 2015-02-13 DIAGNOSIS — D62 Acute posthemorrhagic anemia: Secondary | ICD-10-CM | POA: Diagnosis present

## 2015-02-13 DIAGNOSIS — K5730 Diverticulosis of large intestine without perforation or abscess without bleeding: Secondary | ICD-10-CM | POA: Diagnosis present

## 2015-02-13 DIAGNOSIS — R413 Other amnesia: Secondary | ICD-10-CM | POA: Insufficient documentation

## 2015-02-13 DIAGNOSIS — F03918 Unspecified dementia, unspecified severity, with other behavioral disturbance: Secondary | ICD-10-CM | POA: Diagnosis present

## 2015-02-13 DIAGNOSIS — K5900 Constipation, unspecified: Secondary | ICD-10-CM | POA: Diagnosis present

## 2015-02-13 DIAGNOSIS — K921 Melena: Principal | ICD-10-CM | POA: Diagnosis present

## 2015-02-13 DIAGNOSIS — Z23 Encounter for immunization: Secondary | ICD-10-CM

## 2015-02-13 DIAGNOSIS — I10 Essential (primary) hypertension: Secondary | ICD-10-CM | POA: Diagnosis present

## 2015-02-13 DIAGNOSIS — D509 Iron deficiency anemia, unspecified: Secondary | ICD-10-CM | POA: Diagnosis not present

## 2015-02-13 HISTORY — DX: Anemia, unspecified: D64.9

## 2015-02-13 HISTORY — DX: Gastro-esophageal reflux disease without esophagitis: K21.9

## 2015-02-13 HISTORY — DX: Cardiac murmur, unspecified: R01.1

## 2015-02-13 HISTORY — DX: Unspecified osteoarthritis, unspecified site: M19.90

## 2015-02-13 HISTORY — DX: Pure hypercholesterolemia, unspecified: E78.00

## 2015-02-13 HISTORY — DX: Unspecified asthma, uncomplicated: J45.909

## 2015-02-13 LAB — IRON AND TIBC
Iron: 11 ug/dL — ABNORMAL LOW (ref 28–170)
Saturation Ratios: 2 % — ABNORMAL LOW (ref 10.4–31.8)
TIBC: 447 ug/dL (ref 250–450)
UIBC: 436 ug/dL

## 2015-02-13 LAB — FERRITIN: FERRITIN: 3 ng/mL — AB (ref 11–307)

## 2015-02-13 LAB — COMPREHENSIVE METABOLIC PANEL
ALBUMIN: 3.3 g/dL — AB (ref 3.5–5.0)
ALT: 10 U/L — ABNORMAL LOW (ref 14–54)
ANION GAP: 9 (ref 5–15)
AST: 17 U/L (ref 15–41)
Alkaline Phosphatase: 53 U/L (ref 38–126)
BILIRUBIN TOTAL: 0.4 mg/dL (ref 0.3–1.2)
BUN: 19 mg/dL (ref 6–20)
CHLORIDE: 103 mmol/L (ref 101–111)
CO2: 25 mmol/L (ref 22–32)
Calcium: 8.8 mg/dL — ABNORMAL LOW (ref 8.9–10.3)
Creatinine, Ser: 1.09 mg/dL — ABNORMAL HIGH (ref 0.44–1.00)
GFR calc Af Amer: 53 mL/min — ABNORMAL LOW (ref >60)
GFR calc non Af Amer: 46 mL/min — ABNORMAL LOW (ref >60)
GLUCOSE: 119 mg/dL — AB (ref 65–99)
POTASSIUM: 3.9 mmol/L (ref 3.5–5.1)
SODIUM: 137 mmol/L (ref 135–145)
TOTAL PROTEIN: 6.4 g/dL — AB (ref 6.5–8.1)

## 2015-02-13 LAB — CBC
HEMATOCRIT: 20.2 % — AB (ref 36.0–46.0)
Hemoglobin: 6.1 g/dL — CL (ref 12.0–15.0)
MCH: 20.2 pg — ABNORMAL LOW (ref 26.0–34.0)
MCHC: 30.2 g/dL (ref 30.0–36.0)
MCV: 66.9 fL — ABNORMAL LOW (ref 78.0–100.0)
PLATELETS: 414 10*3/uL — AB (ref 150–400)
RBC: 3.02 MIL/uL — ABNORMAL LOW (ref 3.87–5.11)
RDW: 15.6 % — AB (ref 11.5–15.5)
WBC: 5.8 10*3/uL (ref 4.0–10.5)

## 2015-02-13 LAB — FOLATE: Folate: 45 ng/mL (ref >5.9)

## 2015-02-13 LAB — RETICULOCYTES
RBC.: 3.02 MIL/uL — AB (ref 3.87–5.11)
RETIC COUNT ABSOLUTE: 33.2 10*3/uL (ref 19.0–186.0)
RETIC CT PCT: 1.1 % (ref 0.4–3.1)

## 2015-02-13 LAB — BRAIN NATRIURETIC PEPTIDE: B NATRIURETIC PEPTIDE 5: 85.7 pg/mL (ref 0.0–100.0)

## 2015-02-13 LAB — MAGNESIUM: Magnesium: 2.1 mg/dL (ref 1.7–2.4)

## 2015-02-13 LAB — ABO/RH: ABO/RH(D): O POS

## 2015-02-13 LAB — PREPARE RBC (CROSSMATCH)

## 2015-02-13 LAB — TROPONIN I

## 2015-02-13 LAB — PHOSPHORUS: Phosphorus: 4 mg/dL (ref 2.5–4.6)

## 2015-02-13 MED ORDER — SIMVASTATIN 20 MG PO TABS
20.0000 mg | ORAL_TABLET | Freq: Every day | ORAL | Status: DC
  Administered 2015-02-13 – 2015-02-17 (×5): 20 mg via ORAL
  Filled 2015-02-13 (×5): qty 1

## 2015-02-13 MED ORDER — SODIUM CHLORIDE 0.9 % IJ SOLN
3.0000 mL | Freq: Two times a day (BID) | INTRAMUSCULAR | Status: DC
  Administered 2015-02-13: 10 mL via INTRAVENOUS
  Administered 2015-02-14 – 2015-02-15 (×3): 3 mL via INTRAVENOUS

## 2015-02-13 MED ORDER — INFLUENZA VAC SPLIT QUAD 0.5 ML IM SUSY
0.5000 mL | PREFILLED_SYRINGE | INTRAMUSCULAR | Status: AC
  Administered 2015-02-14: 0.5 mL via INTRAMUSCULAR
  Filled 2015-02-13: qty 0.5

## 2015-02-13 MED ORDER — SODIUM CHLORIDE 0.9 % IV SOLN
INTRAVENOUS | Status: DC
  Administered 2015-02-13 – 2015-02-16 (×4): via INTRAVENOUS

## 2015-02-13 MED ORDER — ACETAMINOPHEN 650 MG RE SUPP: 650.0000 mg | Freq: Four times a day (QID) | RECTAL | Status: DC | PRN

## 2015-02-13 MED ORDER — ONDANSETRON HCL 4 MG PO TABS: 4.0000 mg | ORAL_TABLET | Freq: Four times a day (QID) | ORAL | Status: DC | PRN

## 2015-02-13 MED ORDER — ACETAMINOPHEN 325 MG PO TABS: 650.0000 mg | ORAL_TABLET | Freq: Four times a day (QID) | ORAL | Status: DC | PRN

## 2015-02-13 MED ORDER — CYANOCOBALAMIN 250 MCG PO TABS
250.0000 ug | ORAL_TABLET | Freq: Every day | ORAL | Status: DC
  Administered 2015-02-13 – 2015-02-16 (×4): 250 ug via ORAL
  Filled 2015-02-13 (×5): qty 1

## 2015-02-13 MED ORDER — PANTOPRAZOLE SODIUM 40 MG IV SOLR
40.0000 mg | Freq: Two times a day (BID) | INTRAVENOUS | Status: DC
  Administered 2015-02-13 – 2015-02-14 (×2): 40 mg via INTRAVENOUS
  Filled 2015-02-13 (×2): qty 40

## 2015-02-13 MED ORDER — ONDANSETRON HCL 4 MG/2ML IJ SOLN: 4.0000 mg | Freq: Four times a day (QID) | INTRAMUSCULAR | Status: DC | PRN

## 2015-02-13 MED ORDER — POLYETHYLENE GLYCOL 3350 17 G PO PACK
17.0000 g | PACK | Freq: Every day | ORAL | Status: DC
Start: 2015-02-13 — End: 2015-02-17
  Administered 2015-02-13 – 2015-02-16 (×3): 17 g via ORAL
  Filled 2015-02-13 (×3): qty 1

## 2015-02-13 MED ORDER — FUROSEMIDE 10 MG/ML IJ SOLN
20.0000 mg | Freq: Once | INTRAMUSCULAR | Status: AC
  Administered 2015-02-14: 20 mg via INTRAVENOUS
  Filled 2015-02-13: qty 2

## 2015-02-13 MED ORDER — SODIUM CHLORIDE 0.9 % IV SOLN: Freq: Once | INTRAVENOUS | Status: DC

## 2015-02-13 NOTE — H&P (Signed)
[facility] History and Physical  Sheila West JQB:[id number] DOB: [birth year] DOA: 02/13/2015  Referring physician: Dr. Laurann Montana  PCP: [name], MD   Chief Complaint: anemia, fatigue, dizziness, melena  HPI: Sheila West is a 81 y.o. female with hx of GERD, HTN, meningioma, OA, dementia and HLD; presented to PCP office complaining of increase weakness, fatigue and dizzy spells. Patient reports symptoms has been slowly but steadily progressing over the last 3-4 weeks. She denies any dysuria, hematuria and/or hematemesis. Endorses having some intermittent episodes of black melena, which she attributed to use of MV. Day prior to admission she had difficulty accomplishing her chores due to increase weakness and when standing felt dizzy. She endorses some lower mid chest pain, no radiated, that lasted approx 45-60 minutes and faded away with rest. No SOB.  At her PCP office she was found to be very pale, Hgb was 5.2 and she was having increase LE swelling. VSS. PCP sent patient to hospital for further evaluation and treatment of symptomatic anemia.  Review of Systems:  Negative except as otherwise mentioned on HPI.  Past Medical History  Diagnosis Date  . Hypertension   . Intracranial meningioma   . Melanoma     "I've had multile melanomas removed"  . Hypercholesterolemia   . Heart murmur dx'd 02/13/2015  . Asthma   . Anemia   . GERD (gastroesophageal reflux disease)   . Arthritis     "left shoulder; left hip" (02/13/2015)   Past Surgical History  Procedure Laterality Date  . Replacement total knee Right   . Joint replacement    . Appendectomy    . Total abdominal hysterectomy     Social History:  reports that she has never smoked. She has never used smokeless tobacco. She reports that she does not drink alcohol or use illicit drugs.  No Known Allergies   Family history: significant for dementia and HTN; otherwise no contributory   Prior to Admission  medications   Medication Sig Start Date End Date Taking? Authorizing Provider  alendronate (FOSAMAX) 70 MG tablet Take 70 mg by mouth once a week. Take with a full glass of water on an empty stomach.    Historical Provider, MD  aspirin EC 81 MG tablet Take 1 tablet (81 mg total) by mouth daily. 01/28/14   Bonnielee Haff, MD  Calcium Carb-Cholecalciferol (CALCIUM 600 + D PO) Take 1 tablet by mouth daily.    Historical Provider, MD  Coenzyme Q10 (CO Q-10) 50 MG CAPS Take 50 mg by mouth daily.    Historical Provider, MD  hydrochlorothiazide (HYDRODIURIL) 25 MG tablet Take 25 mg by mouth daily.    Historical Provider, MD  omeprazole (PRILOSEC) 20 MG capsule Take 1 capsule (20 mg total) by mouth at bedtime. 01/28/14   Bonnielee Haff, MD  polyethylene glycol (MIRALAX / GLYCOLAX) packet Take 17 g by mouth daily as needed for moderate constipation. 08/19/13   Kingsley Spittle, MD  simvastatin (ZOCOR) 20 MG tablet Take 20 mg by mouth daily.    Historical Provider, MD  vitamin B-12 (CYANOCOBALAMIN) 250 MCG tablet Take 250 mcg by mouth daily.    Historical Provider, MD  Vitamin D, Cholecalciferol, 1000 UNITS TABS Take 1,000 Units by mouth daily.    Historical Provider, MD   Physical Exam: There were no vitals filed for this visit.  Wt Readings from Last 3 Encounters:  02/13/15 58.06 kg (128 lb)  03/10/14 58.968 kg (130 lb)  03/04/14 60.328 kg (133 lb)  General:  Appears calm and comfortable; no acute distress. Slow to answer but appropriate. Denies CP or SOB during examination. Patient is pale and frail on exam Eyes: PERRL, normal lids, irises & conjunctiva, no icterus, no nystagmus ENT: grossly normal hearing, lips & tongue, no erythema, no exudates, no drainage out of ears or nostrils  Neck: no LAD, masses or thyromegaly, no JVD Cardiovascular: RRR, positive SEM, no rubs or gallops Respiratory: CTA bilaterally, no w/r/r. Normal respiratory effort. Abdomen: soft, nd, mild pain to deep palpation in  epigastric/mid abdomen; positive BS, no guarding Skin: no rash or induration seen on exam Musculoskeletal: grossly normal tone BUE/BLE Psychiatric: grossly normal mood and affect; slow to respond but appropriate Neurologic: grossly non-focal.          Labs on Admission:  Basic Metabolic Panel: No results for input(s): NA, K, CL, CO2, GLUCOSE, BUN, CREATININE, CALCIUM, MG, PHOS in the last 168 hours. Liver Function Tests: No results for input(s): AST, ALT, ALKPHOS, BILITOT, PROT, ALBUMIN in the last 168 hours. No results for input(s): LIPASE, AMYLASE in the last 168 hours. No results for input(s): AMMONIA in the last 168 hours. CBC: No results for input(s): WBC, NEUTROABS, HGB, HCT, MCV, PLT in the last 168 hours. Cardiac Enzymes: No results for input(s): CKTOTAL, CKMB, CKMBINDEX, TROPONINI in the last 168 hours.  BNP (last 3 results) No results for input(s): BNP in the last 8760 hours.  ProBNP (last 3 results) No results for input(s): PROBNP in the last 8760 hours.  CBG: No results for input(s): GLUCAP in the last 168 hours.  Radiological Exams on Admission: No results found.  EKG:  Ordered, but pending at the time of this note  Assessment/Plan 1-blood loss anemia: appears to be secondary to Melena. Patient with hx of ASA and diclofenac use. Reported intermittent episodes of black stool and constipation. Now pale, dizzy, with increase fatigue and generalized weakness. -will admit to telemetry -will check Hgb trend -will order anemia panel -will initiate IV protonix -Hgb at PCP office 5.2 -will transfuse 2 units of PRBC's -patient is overall hemodynamically stable -2 large bore IV's -discussed with GI, plan is for EGD tomorrow 9/13 -NPO now  2-Hypertension: BP stable -will hold HCTZ -very gentle hydration -patient with normal BP  3-Hyperlipidemia: will continue statins  4-GERD (gastroesophageal reflux disease): as mentioned above will use PPI protonix BID  5-hx  of Meningioma: no blurred vision, no Ha's -patient with memory loss (most likely dementia) -will check CT head  6-Leg swelling: could be secondary to hypoalbuminemia or venous insufficiency -had hx of HTN and positive mumur -will check BNP -will check 2-D echo -daily weights and strict I's and O's  7-Dementia without behavioral disturbance: MMSE at PCP office 20/30 -TSH and B12 done at PCP office -will check CT head -might benefit of use of aricept and/or namenda -no behavioral disturbances -strong family hx for Dementia  8-chest pain: epigastric pain/lower chest -most likely due to GERD -will check EKG and cycle troponin -will check 2-D echo -might be associated to anemia  GI: Dr. Enis Gash   Code Status: Full DVT Prophylaxis:SCD's Family Communication: patient accompanied by neighbor  Disposition Plan: inpatient, LOS > 2 midnights; telemetry bed  Time spent: 60 minutes  Barton Dubois [facility] Pager [phone]

## 2015-02-13 NOTE — Progress Notes (Signed)
Patient arrived to unit as a direct admission. Dr. Sheran Fava paged to make aware of patient's arrival. Patient states she feels " a little dizzy" when she walks and has " a little pain in my left shoulder". Patient states she normally gets steroid injections for her shoulder. Awaiting orders.

## 2015-02-14 ENCOUNTER — Encounter (HOSPITAL_COMMUNITY)
Admission: AD | Disposition: A | Discharge status: Home / Self Care | Payer: Self-pay | Source: Ambulatory Visit | Attending: Internal Medicine

## 2015-02-14 ENCOUNTER — Encounter (HOSPITAL_COMMUNITY): Payer: Self-pay | Admitting: *Deleted

## 2015-02-14 DIAGNOSIS — K921 Melena: Principal | ICD-10-CM

## 2015-02-14 DIAGNOSIS — D500 Iron deficiency anemia secondary to blood loss (chronic): Secondary | ICD-10-CM

## 2015-02-14 DIAGNOSIS — D509 Iron deficiency anemia, unspecified: Secondary | ICD-10-CM

## 2015-02-14 HISTORY — PX: ESOPHAGOGASTRODUODENOSCOPY: SHX5428

## 2015-02-14 LAB — CBC
HCT: 29.5 % — ABNORMAL LOW (ref 36.0–46.0)
HEMATOCRIT: 25.3 % — AB (ref 36.0–46.0)
HEMOGLOBIN: 7.9 g/dL — AB (ref 12.0–15.0)
HEMOGLOBIN: 9.3 g/dL — AB (ref 12.0–15.0)
MCH: 23 pg — ABNORMAL LOW (ref 26.0–34.0)
MCH: 23 pg — AB (ref 26.0–34.0)
MCHC: 31.2 g/dL (ref 30.0–36.0)
MCHC: 31.5 g/dL (ref 30.0–36.0)
MCV: 73.5 fL — ABNORMAL LOW (ref 78.0–100.0)
MCV: 73 fL — AB (ref 78.0–100.0)
PLATELETS: 308 10*3/uL (ref 150–400)
Platelets: 343 10*3/uL (ref 150–400)
RBC: 3.44 MIL/uL — AB (ref 3.87–5.11)
RBC: 4.04 MIL/uL (ref 3.87–5.11)
RDW: 20.6 % — AB (ref 11.5–15.5)
RDW: 20.3 % — ABNORMAL HIGH (ref 11.5–15.5)
WBC: 5.8 10*3/uL (ref 4.0–10.5)
WBC: 5.8 10*3/uL (ref 4.0–10.5)

## 2015-02-14 LAB — TROPONIN I: Troponin I: <0.03 ng/mL (ref <0.031)

## 2015-02-14 SURGERY — EGD (ESOPHAGOGASTRODUODENOSCOPY)
Anesthesia: Moderate Sedation

## 2015-02-14 MED ORDER — FENTANYL CITRATE (PF) 100 MCG/2ML IJ SOLN
INTRAMUSCULAR | Status: AC
  Filled 2015-02-14: qty 2

## 2015-02-14 MED ORDER — BOOST / RESOURCE BREEZE PO LIQD
1.0000 | Freq: Two times a day (BID) | ORAL | Status: DC
  Administered 2015-02-14 – 2015-02-17 (×5): 1 via ORAL

## 2015-02-14 MED ORDER — BUTAMBEN-TETRACAINE-BENZOCAINE 2-2-14 % EX AERO
INHALATION_SPRAY | CUTANEOUS | Status: DC | PRN
  Administered 2015-02-14: 2 via TOPICAL

## 2015-02-14 MED ORDER — PEG-KCL-NACL-NASULF-NA ASC-C 100 G PO SOLR
0.5000 | Freq: Once | ORAL | Status: AC
  Administered 2015-02-15: 100 g via ORAL

## 2015-02-14 MED ORDER — PEG-KCL-NACL-NASULF-NA ASC-C 100 G PO SOLR: 1.0000 | Freq: Once | ORAL | Status: DC

## 2015-02-14 MED ORDER — SODIUM CHLORIDE 0.9 % IV SOLN
INTRAVENOUS | Status: DC
  Administered 2015-02-14: 10:00:00 via INTRAVENOUS

## 2015-02-14 MED ORDER — MIDAZOLAM HCL 5 MG/ML IJ SOLN
INTRAMUSCULAR | Status: AC
  Filled 2015-02-14: qty 2

## 2015-02-14 MED ORDER — FENTANYL CITRATE (PF) 100 MCG/2ML IJ SOLN
INTRAMUSCULAR | Status: DC | PRN
  Administered 2015-02-14: 25 ug via INTRAVENOUS

## 2015-02-14 MED ORDER — MIDAZOLAM HCL 10 MG/2ML IJ SOLN
INTRAMUSCULAR | Status: DC | PRN
  Administered 2015-02-14: 2 mg via INTRAVENOUS

## 2015-02-14 MED ORDER — PEG-KCL-NACL-NASULF-NA ASC-C 100 G PO SOLR
0.5000 | Freq: Once | ORAL | Status: AC
  Administered 2015-02-14: 100 g via ORAL
  Filled 2015-02-14: qty 1

## 2015-02-14 NOTE — Consult Note (Signed)
HPI :  81 y/o female with history of melanoma seen in consultation for a suspected GI bleed and iron deficiency anemia. The patient presented to her PCM yesterday with fatigue and lightheadedness. She endorses having intermittent black tarry stools for "a few weeks", and did not think much of it. She also endorses epigastric / mid abdominal pain which can come and go. She endorses taking aspirin and other NSAIDs periodically for shoulder and hip pains. She thinks she may have a remote history of PUD, but her memory of this is not reliable. She endorses having a prior colonoscopy, she thinks 10 yrs ago and was normal, however none on file in her chart that can be seen. She has been nauseous with these symptoms but no vomiting. Otherwise no weight loss. Denies other changes in her bowel habits. She was started on protonix IV overnight. Her previous baseline Hgb prior to admission was 13.  Past Medical History  Diagnosis Date  . Hypertension   . Intracranial meningioma   . Melanoma     "I've had multile melanomas removed"  . Hypercholesterolemia   . Heart murmur dx'd 02/13/2015  . Asthma   . Anemia   . GERD (gastroesophageal reflux disease)   . Arthritis     "left shoulder; left hip" (02/13/2015)     Past Surgical History  Procedure Laterality Date  . Replacement total knee Right   . Joint replacement    . Appendectomy    . Total abdominal hysterectomy     History reviewed. No pertinent family history. Patient denies FH of CRC or gastric/esophageal CA. Social History  Substance Use Topics  . Smoking status: Never Smoker   . Smokeless tobacco: Never Used  . Alcohol Use: No   Current Facility-Administered Medications  Medication Dose Route Frequency Provider Last Rate Last Dose  . 0.9 %  sodium chloride infusion   Intravenous Once Barton Dubois, MD      . 0.9 %  sodium chloride infusion   Intravenous Continuous Barton Dubois, MD 50 mL/hr at 02/13/15 1805    . acetaminophen (TYLENOL)  tablet 650 mg  650 mg Oral Q6H PRN Barton Dubois, MD       Or  . acetaminophen (TYLENOL) suppository 650 mg  650 mg Rectal Q6H PRN Barton Dubois, MD      . Influenza vac split quadrivalent PF (FLUARIX) injection 0.5 mL  0.5 mL Intramuscular Tomorrow-1000 Barton Dubois, MD      . ondansetron [facility]) tablet 4 mg  4 mg Oral Q6H PRN Barton Dubois, MD       Or  . ondansetron [facility]) injection 4 mg  4 mg Intravenous Q6H PRN Barton Dubois, MD      . pantoprazole (PROTONIX) injection 40 mg  40 mg Intravenous Q12H Barton Dubois, MD   40 mg at 02/13/15 2159  . polyethylene glycol (MIRALAX / GLYCOLAX) packet 17 g  17 g Oral Daily Barton Dubois, MD   17 g at 02/13/15 1805  . simvastatin (ZOCOR) tablet 20 mg  20 mg Oral q1800 Barton Dubois, MD   20 mg at 02/13/15 1805  . sodium chloride 0.9 % injection 3 mL  3 mL Intravenous Q12H Barton Dubois, MD   10 mL at 02/13/15 2159  . vitamin B-12 (CYANOCOBALAMIN) tablet 250 mcg  250 mcg Oral Daily Barton Dubois, MD   250 mcg at 02/13/15 1805   No Known Allergies   Review of Systems: All systems reviewed and negative except where noted in  HPI.    Ct Head Wo Contrast  02/14/2015   CLINICAL DATA:  Memory loss, dizziness. Hospital admission for leg swelling. Known meningioma, history of melanoma, hypertension.  EXAM: CT HEAD WITHOUT CONTRAST  TECHNIQUE: Contiguous axial images were obtained from the base of the skull through the vertex without intravenous contrast.  COMPARISON:  MRI of the brain June 08, 2014  FINDINGS: The ventricles and sulci are normal for age. No intraparenchymal hemorrhage, mass effect nor midline shift. Patchy supratentorial white matter hypodensities are within normal range for patient's age and though non-specific suggest sequelae of chronic small vessel ischemic disease. No acute large vascular territory infarcts. Symmetric basal ganglia and bilateral cerebellar mineralization.  No abnormal extra-axial fluid collections. 2 x 2.3 cm RIGHT  frontal extra-axial her calcified mass consistent with history of meningioma, subjacent mass effect and small hypodensity is unchanged. Basal cisterns are patent. Moderate calcific atherosclerosis of the carotid siphons.  No skull fracture. The included ocular globes and orbital contents are non-suspicious. The mastoid aircells and included paranasal sinuses are well-aerated. Moderate temporomandibular osteoarthrosis.  IMPRESSION: No acute intracranial process.  Stable 2 x 2.3 cm RIGHT frontal meningioma, similar subjacent mass effect and, mild edema versus encephalomalacia.  Involutional and, mild to moderate white matter changes compatible with chronic small vessel ischemic disease.   Electronically Signed   By: Elon Alas M.D.   On: 02/14/2015 00:16    Physical Exam: BP 143/54 mmHg  Pulse 66  Temp(Src) 98.4 F (36.9 C) (Oral)  Resp 18  Ht 5\' 2"  (1.575 m)  Wt 126 lb 15.8 oz (57.6 kg)  BMI 23.22 kg/m2  SpO2 100% Constitutional: Pleasant,well-developed, female in no acute distress. HEENT: Normocephalic and atraumatic. Conjunctivae are pale. No scleral icterus. Neck supple.  Cardiovascular: Normal rate, regular rhythm. 2/6 SEM  Pulmonary/chest: Effort normal and breath sounds normal. No wheezing, rales or rhonchi. Abdominal: Soft, nondistended, mild epigastric TTP without rebound or guarding. Bowel sounds active throughout.  Extremities: no edema Lymphadenopathy: No cervical adenopathy noted. Neurological: Alert and oriented to person place and time. Skin: Skin is warm and dry. No rashes noted. Psychiatric: Normal mood and affect. Behavior is normal.  Labs reviewed in Epic. Hgb 6.1 to 7.9 post transfusion of PRBC, baseline 13s, MCV 66, iron deficient, BUN/Cr normal    ASSESSMENT AND PLAN: 81 y/o female with PMH as above presenting with intermittent upper abdominal pain and what appears to be melena, admitted with symptomatic anemia, and noted to be iron deficient. Patient does  endorse NSAID use for her orthopedic pains. PUD is high on the differential given symptoms and her history, and recommend starting with an EGD to clear the upper GI tract. If this is negative, will then need to proceed with colonoscopy +/- capsule endoscopy to complete her workup. She endorses a prior normal colonoscopy and will attempt to get this record, but appears to have been done around 10 years ago. She has responded well to transfusion thus far, hemodynamically stable. Agree with PPI and will await results of EGD. The indications, risks, and benefits of EGD were explained to the patient in detail. Risks include but not limited to bleeding, perforation, adverse reaction to medications, cardiopulmonary compromise. Patient verbalized understanding and wished to proceed. Further recommendations pending the results. Please call with questions or changes in her status in the interim.   Baker Cellar, MD [facility] Pager [phone]

## 2015-02-14 NOTE — Evaluation (Signed)
Physical Therapy Evaluation Patient Details Name: Sheila West MRN: [id number] DOB: 1930-03-04 Today's Date: 02/14/2015   History of Present Illness  81 year old female history of GERD, hypertension, meningioma, dementia presented for weakness and fatigue. Found to have a hemoglobin of 5.2 upon admission.   Clinical Impression  Pt pleasant with slow cognitive processing.  Pt ambulated without assistive device though she demonstrated unsteadiness requiring Min assist to prevent LOB.  PT is necessary to address documented impairments with focus on improving dynamic balance and to ensure safety with functional mobility.    Follow Up Recommendations Home health PT;Supervision/Assistance - 24 hour    Equipment Recommendations  Rolling walker with 5" wheels    Recommendations for Other Services       Precautions / Restrictions Precautions Precautions: Fall Restrictions Weight Bearing Restrictions: No      Mobility  Bed Mobility               General bed mobility comments: pt received sitting in chair  Transfers Overall transfer level: Needs assistance Equipment used: None Transfers: Sit to/from Stand Sit to Stand: Supervision            Ambulation/Gait Ambulation/Gait assistance: Min assist Ambulation Distance (Feet): 200 Feet Assistive device: None Gait Pattern/deviations: Step-through pattern;Drifts right/left     General Gait Details: instability and drifting with gait; pt notes she feels unsteady especially when looking down while walking  Stairs            Wheelchair Mobility    Modified Rankin (Stroke Patients Only)       Balance Overall balance assessment: Needs assistance Sitting-balance support: Feet supported Sitting balance-Leahy Scale: Good     Standing balance support: No upper extremity supported;During functional activity Standing balance-Leahy Scale: Fair                               Pertinent Vitals/Pain  Pain Assessment: 0-10 Pain Score: 0-No pain Pain Location: notes recent L shoulder pain with movement Pain Descriptors / Indicators: Grimacing Pain Intervention(s): Monitored during session (no noticable pain)    Home Living Family/patient expects to be discharged to:: Private residence Living Arrangements: Alone Available Help at Discharge: Friend(s);Neighbor;Available PRN/intermittently Type of Home: House Home Access: Other (comment)     Home Layout: One level Home Equipment: Shower seat;Cane - single point (multiple canes)      Prior Function Level of Independence: Independent with assistive device(s)               Hand Dominance        Extremity/Trunk Assessment   Upper Extremity Assessment: Defer to OT evaluation       LUE Deficits / Details: friend reports that pt has bursitis in left shoulder; limited ROM-pain in left shoulder   Lower Extremity Assessment: Overall WFL for tasks assessed         Communication   Communication: Other (comment) (seemed to be slightly HOH)  Cognition Arousal/Alertness: Awake/alert Behavior During Therapy: WFL for tasks assessed/performed Overall Cognitive Status: Impaired/Different from baseline Area of Impairment: Orientation;Safety/judgement;Problem solving Orientation Level: Disoriented to;Place       Safety/Judgement: Decreased awareness of safety   Problem Solving: Slow processing;Requires verbal cues General Comments: pleasant; requiring increased time to answer questions    General Comments General comments (skin integrity, edema, etc.): pt has 2 friends present during treatment who note pt seems much better now than she did prior to blood transfusion  Exercises        Assessment/Plan    PT Assessment Patient needs continued PT services  PT Diagnosis Difficulty walking   PT Problem List Decreased activity tolerance;Decreased balance;Decreased mobility;Decreased coordination;Decreased  cognition;Decreased safety awareness  PT Treatment Interventions DME instruction;Gait training;Stair training;Functional mobility training;Therapeutic activities;Therapeutic exercise;Balance training;Neuromuscular re-education;Cognitive remediation;Patient/family education   PT Goals (Current goals can be found in the Care Plan section) Acute Rehab PT Goals Patient Stated Goal: not stated PT Goal Formulation: Patient unable to participate in goal setting Time For Goal Achievement: 02-27-15 Potential to Achieve Goals: Good    Frequency Min 3X/week   Barriers to discharge Decreased caregiver support neighbors able to help out but 24 hour assistance is not available    Co-evaluation               End of Session Equipment Utilized During Treatment: Gait belt;Other (comment) (IV pole) Activity Tolerance: Patient tolerated treatment well Patient left: in chair;with call bell/phone within reach;with chair alarm set;with family/visitor present Nurse Communication: Mobility status         Time: 9021-1155 PT Time Calculation (min) (ACUTE ONLY): 19 min   Charges:   PT Evaluation $Initial PT Evaluation Tier I: 1 Procedure     PT G CodesEstill Bamberg Kwadwo Taras 2015/02/27, 4:00 PM   Lorita Officer, SPT

## 2015-02-14 NOTE — Care Management Note (Signed)
Case Management Note  Patient Details  Name: FLOELLA ENSZ MRN: [id number] Date of Birth: [birth year]  Subjective/Objective:                 Patient from home, independent. Admitted for melena with anemia. Received blood transfusion. Anticipate discharge to home when medically stable.   Action/Plan:  Will continue to follow and offer resources and [facility] as needed.  Expected Discharge Date:                  Expected Discharge Plan:  Home/Self Care  In-House Referral:     Discharge planning Services  CM Consult  Post Acute Care Choice:    Choice offered to:     DME Arranged:    DME Agency:     HH Arranged:    HH Agency:     Status of Service:  In process, will continue to follow  Medicare Important Message Given:    Date Medicare IM Given:    Medicare IM give by:    Date Additional Medicare IM Given:    Additional Medicare Important Message give by:     If discussed at [place] of Stay Meetings, dates discussed:    Additional Comments:  Carles Collet, RN 02/14/2015, 2:30 PM

## 2015-02-14 NOTE — Progress Notes (Signed)
Initial Nutrition Assessment  INTERVENTION:  Diet advancement per MD Provide Boost Breeze po BID, each supplement provides 250 kcal and 9 grams of protein Add Ensure Enlive once diet is advanced   NUTRITION DIAGNOSIS:   Predicted suboptimal nutrient intake related to altered GI function as evidenced by NPO status.   GOAL:   Patient will meet greater than or equal to 90% of their needs   MONITOR:   Diet advancement, PO intake, Supplement acceptance, Labs, Weight trends, I & O's  REASON FOR ASSESSMENT:   Malnutrition Screening Tool    ASSESSMENT:   81 y.o. female with hx of GERD, HTN, meningioma, OA, dementia and HLD; presented to PCP office complaining of increase weakness, fatigue and dizzy spells. Patient reports symptoms has been slowly but steadily progressing over the last 3-4 weeks.   Pt has been NPO since admission, advanced to clear liquids this afternoon but, will return to NPO at midnight. Pt reports having a normal appetite and eating well PTA. She states that she has lost 25 lbs in the past year however, weight history shows pt has only lost 4 lbs in the past year. Pt reports that she typically eats small snacks throughout the day and has a bowl of cereal for breakfast. She used to drink Ensure/Boost supplements but, hasn't recently. Per physical exam, pt has some moderate muscle wasting of clavicles and moderate fat wasting of arms but, appears well-nourished overall.  RD discussed the importance of nutrition. Encouraged a general healthful diet with a variety of foods and 3 daily meals.   Labs: low hemoglobin  Diet Order:  Diet clear liquid Room service appropriate?: Yes; Fluid consistency:: Thin Diet NPO time specified  Skin:  Reviewed, no issues  Last BM:  9/12  Height:   Ht Readings from Last 1 Encounters:  02/13/15 5\' 2"  (1.575 m)    Weight:   Wt Readings from Last 1 Encounters:  02/14/15 126 lb 15.8 oz (57.6 kg)    Ideal Body Weight:  50  kg  BMI:  Body mass index is 23.22 kg/(m^2).  Estimated Nutritional Needs:   Kcal:  1400-1600  Protein:  70-80 grams  Fluid:  1.4-1.6 L/day  EDUCATION NEEDS:   No education needs identified at this time  [place], LDN Inpatient Clinical Dietitian Pager: [phone] After Hours Pager: [phone]

## 2015-02-14 NOTE — Progress Notes (Signed)
PT Cancellation Note  Patient Details Name: Sheila West MRN: [id number] DOB: Feb 08, 1930   Cancelled Treatment:    Reason Eval/Treat Not Completed: Patient at procedure or test/unavailable (patient off the floor at this time)   Duncan Dull 02/14/2015, 11:51 AM Alben Deeds, PT DPT  [phone]

## 2015-02-14 NOTE — Progress Notes (Signed)
[facility]                                                                              Patient Demographics  Sheila West, is a 81 y.o. female, DOB - 1930-02-24, GGE:[id number]  Admit date - 02/13/2015   Admitting Physician Manus Gunning, MD  Outpatient Primary MD for the patient is [name], MD  LOS - 1   No chief complaint on file.     Interim hisotry 81 year old female history of GERD, hypertension, meningioma, dementia presented for weakness and fatigue. Found to have a hemoglobin of 5.2 upon admission. Gastroenterology consulted and appreciated performed EGD today. Colonoscopy planned for tomorrow.  Assessment & Plan   Symptomatic anemia /Acute blood loss anemia/melana -Patient does have history of NSAID as well as aspirin use.  Complains of black stool. -Hemoglobin upon admission 5.2, given 2 uPRBC, hemoglobin currently 7.9 -Anemia panel shows iron 11, ferritin 3 -Continue Protonix -Gastroneurology consultation appreciated -EGD done today: Normal EGD. Recommendations are, bowel prep for colonoscopy, no NSAIDs -Colonoscopy planned for tomorrow, 02/15/2015 -Continue to monitor CBC  Hypertension -HCTZ held -Continue to monitor  Hyperlipidemia -Continue statin  GERD -Continue PPI  History of meningioma -Stable, currently no headache or vision changes -CT head: No acute intracranial process  Lower extremity swelling -BNP 85.7 -Echocardiogram pending -Monitor daily weights, intake and output -Possibly complicated by hypoalbuminemia versus venous insufficiency  Dementia -TSH and B-12 levels checked at primary care physician's office  -CT head showed no acute abnormalities  -Patient may likely benefit from the use of Namenda and/or Aricept  Epigastric/lower chest pain -Troponins cycled and found to be negative 3  -Continue PPI   Code Status: Full  Family Communication: None at bedside  Disposition Plan: Admitted.  Pending  further GI workup.  Time Spent in minutes   30 minutes  Procedures  EGD  Consults   Gastroenterology  DVT Prophylaxis  SCDs  Lab Results  Component Value Date   PLT 308 02/14/2015    Medications  Scheduled Meds: . sodium chloride   Intravenous Once  . Influenza vac split quadrivalent PF  0.5 mL Intramuscular Tomorrow-1000  . pantoprazole (PROTONIX) IV  40 mg Intravenous Q12H  . polyethylene glycol  17 g Oral Daily  . simvastatin  20 mg Oral q1800  . sodium chloride  3 mL Intravenous Q12H  . vitamin B-12  250 mcg Oral Daily   Continuous Infusions: . sodium chloride 50 mL/hr at 02/13/15 1805  . sodium chloride 20 mL/hr at 02/14/15 0933   PRN Meds:.acetaminophen **OR** acetaminophen, ondansetron **OR** ondansetron (ZOFRAN) IV  Antibiotics    Anti-infectives    None      Subjective:   Sheila West seen and examined today.  patient has no complaints this morning. Denies any chest pain or abdominal pain, shortness of breath. Denies any further dizziness. Denies headache.   Objective:   Filed Vitals:   02/14/15 1110 02/14/15 1115 02/14/15 1120 02/14/15 1130  BP: 169/72 192/83 174/89 120/61  Pulse: 82 61 86 71  Temp:    98.4 F (36.9 C)  TempSrc:      Resp: 13 16 24 15   Height:  Weight:      SpO2: 100% 100% 100% 100%    Wt Readings from Last 3 Encounters:  02/14/15 57.6 kg (126 lb 15.8 oz)  02/13/15 58.06 kg (128 lb)  03/10/14 58.968 kg (130 lb)     Intake/Output Summary (Last 24 hours) at 02/14/15 1146 Last data filed at 02/14/15 0709  Gross per 24 hour  Intake    670 ml  Output   1600 ml  Net   -930 ml    Exam  General: Well developed, well nourished, NAD, appears stated age  HEENT: NCAT, mucous membranes moist.   Cardiovascular: S1 S2 auscultated, RRR, 2/6SEM.  Respiratory: Clear to auscultation bilaterally with equal chest rise  Abdomen: Soft, nontender, nondistended, + bowel sounds  Extremities: warm dry without cyanosis  clubbing.  +LE edema.  Neuro: AAOx2, nonfocal  Data Review   Micro Results No results found for this or any previous visit (from the past 240 hour(s)).  Radiology Reports Ct Head Wo Contrast  02/14/2015   CLINICAL DATA:  Memory loss, dizziness. Hospital admission for leg swelling. Known meningioma, history of melanoma, hypertension.  EXAM: CT HEAD WITHOUT CONTRAST  TECHNIQUE: Contiguous axial images were obtained from the base of the skull through the vertex without intravenous contrast.  COMPARISON:  MRI of the brain June 08, 2014  FINDINGS: The ventricles and sulci are normal for age. No intraparenchymal hemorrhage, mass effect nor midline shift. Patchy supratentorial white matter hypodensities are within normal range for patient's age and though non-specific suggest sequelae of chronic small vessel ischemic disease. No acute large vascular territory infarcts. Symmetric basal ganglia and bilateral cerebellar mineralization.  No abnormal extra-axial fluid collections. 2 x 2.3 cm RIGHT frontal extra-axial her calcified mass consistent with history of meningioma, subjacent mass effect and small hypodensity is unchanged. Basal cisterns are patent. Moderate calcific atherosclerosis of the carotid siphons.  No skull fracture. The included ocular globes and orbital contents are non-suspicious. The mastoid aircells and included paranasal sinuses are well-aerated. Moderate temporomandibular osteoarthrosis.  IMPRESSION: No acute intracranial process.  Stable 2 x 2.3 cm RIGHT frontal meningioma, similar subjacent mass effect and, mild edema versus encephalomalacia.  Involutional and, mild to moderate white matter changes compatible with chronic small vessel ischemic disease.   Electronically Signed   By: Elon Alas M.D.   On: 02/14/2015 00:16    CBC  Recent Labs Lab 02/13/15 1829 02/14/15 0513  WBC 5.8 5.8  HGB 6.1* 7.9*  HCT 20.2* 25.3*  PLT 414* 308  MCV 66.9* 73.5*  MCH 20.2* 23.0*  MCHC  30.2 31.2  RDW 15.6* 20.6*    Chemistries   Recent Labs Lab 02/13/15 1829  NA 137  K 3.9  CL 103  CO2 25  GLUCOSE 119*  BUN 19  CREATININE 1.09*  CALCIUM 8.8*  MG 2.1  AST 17  ALT 10*  ALKPHOS 53  BILITOT 0.4   ------------------------------------------------------------------------------------------------------------------ estimated creatinine clearance is 31.5 mL/min (by C-G formula based on Cr of 1.09). ------------------------------------------------------------------------------------------------------------------ No results for input(s): HGBA1C in the last 72 hours. ------------------------------------------------------------------------------------------------------------------ No results for input(s): CHOL, HDL, LDLCALC, TRIG, CHOLHDL, LDLDIRECT in the last 72 hours. ------------------------------------------------------------------------------------------------------------------ No results for input(s): TSH, T4TOTAL, T3FREE, THYROIDAB in the last 72 hours.  Invalid input(s): FREET3 ------------------------------------------------------------------------------------------------------------------  Recent Labs  02/13/15 1829  FOLATE 45.0  FERRITIN 3*  TIBC 447  IRON 11*  RETICCTPCT 1.1    Coagulation profile No results for input(s): INR, PROTIME in the last 168 hours.  No results  for input(s): DDIMER in the last 72 hours.  Cardiac Enzymes  Recent Labs Lab 02/13/15 1829 02/13/15 2308 02/14/15 0513  TROPONINI <0.03 <0.03 <0.03   ------------------------------------------------------------------------------------------------------------------ Invalid input(s): POCBNP    Reynol Arnone D.O. on 02/14/2015 at 11:46 AM  Between 7am to 7pm - Pager - [phone]  After 7pm go to [email or web address] - password [email or web address]  And look for the night coverage person covering for me after hours  [facility] Office  [phone]

## 2015-02-14 NOTE — Interval H&P Note (Signed)
History and Physical Interval Note:  02/14/2015 11:02 AM  Sheila West  has presented today for surgery, with the diagnosis of melena, epigastric pain  The various methods of treatment have been discussed with the patient and family. After consideration of risks, benefits and other options for treatment, the patient has consented to  Procedure(s): ESOPHAGOGASTRODUODENOSCOPY (EGD) (N/A) as a surgical intervention .  The patient's history has been reviewed, patient examined, no change in status, stable for surgery.  I have reviewed the patient's chart and labs.  Questions were answered to the patient's satisfaction.     Renelda Loma Armbruster

## 2015-02-14 NOTE — Progress Notes (Signed)
CRITICAL VALUE ALERT  Critical value received:  HGB=6.1;HCT=20.2  Date of notification:  02/13/2015  Time of notification:  1941  Critical value read back:Yes.    Nurse who received alert:  Stephanie Coup RN  MD notified (1st page):  NP Schorr  Time of first page:  0230  MD notified (2nd page):  Time of second page:0240  Responding MD: NP Shcorr   Time MD responded:

## 2015-02-14 NOTE — Op Note (Signed)
[facility] [place], [id number]   ENDOSCOPY PROCEDURE REPORT  PATIENT: Sheila, West  MR#: [id number] BIRTHDATE: March 17, 1930 , 36  yrs. old GENDER: female ENDOSCOPIST: [place] Cellar, MD REFERRED BY: PROCEDURE DATE:  02/14/2015 PROCEDURE:  EGD, diagnostic ASA CLASS:     Class III INDICATIONS:  81 y/o female with iron deficiency anemia, suspected melena, and recent NSAID use. MEDICATIONS: Midazolam (Versed) 2 mg IV and Fentanyl 25 mcg IV TOPICAL ANESTHETIC: Cetacaine Spray  DESCRIPTION OF PROCEDURE: After the risks benefits and alternatives of the procedure were thoroughly explained, informed consent was obtained.  The Pentax Gastroscope [id number] endoscope was introduced through the mouth and advanced to the second portion of the duodenum , Without limitations.  The instrument was slowly withdrawn as the mucosa was fully examined.    FINDINGS: The esophagus was normal.  DH, GEJ located 37cm from the incisors.  The stomach was normal without any mucosal abnormalities. Retroflexed views were normal.  Normal duodenal bulb and 2nd portion duodenum.  Retroflexed views revealed no abnormalities.     The scope was then withdrawn from the patient and the procedure completed.  COMPLICATIONS: There were no immediate complications.  ENDOSCOPIC IMPRESSION: Normal EGD, no pathology noted to cause the patient's anemia and recent symptoms.  RECOMMENDATIONS: Clear liquid diet Bowel preparation this evening for colonoscopy tomorrow to further evaluate anemia. Trend H/H No NSAIDs Please call with questions or changes in her status.   eSigned:  [place] Cellar, MD 02/14/2015 11:31 AM    CC:  PATIENT NAME:  Sheila, West MR#: [id number]

## 2015-02-14 NOTE — Evaluation (Signed)
Occupational Therapy Evaluation Patient Details Name: Sheila West MRN: [id number] DOB: Apr 14, 1930 Today's Date: 02/14/2015    History of Present Illness 81 year old female history of GERD, hypertension, meningioma, dementia presented for weakness and fatigue. Found to have a hemoglobin of 5.2 upon admission.    Clinical Impression   Pt admitted with above. Pt independent with ADLs, PTA. Feel pt will benefit from acute Ot to increase independence prior to d/c. Recommending HHOT.    Follow Up Recommendations  Home health OT;Supervision/Assistance - 24 hour    Equipment Recommendations  3 in 1 bedside comode;Other (comment) (RW?)    Recommendations for Other Services       Precautions / Restrictions Precautions Precautions: Fall Restrictions Weight Bearing Restrictions: No      Mobility Bed Mobility               General bed mobility comments: not assessed  Transfers Overall transfer level: Needs assistance   Transfers: Sit to/from Stand Sit to Stand: Min guard              Balance    Pt with decreased balance with ambulation at times-Min assist.                                        ADL Overall ADL's : Needs assistance/impaired     Grooming: Wash/dry face;Set up;Supervision/safety;Standing   Upper Body Bathing: Moderate assistance;Standing           Lower Body Dressing: Sit to/from stand;Set up;Supervision/safety   Toilet Transfer: Minimal assistance;Ambulation;Min guard (sit to stand from chair-Min guard)           Functional mobility during ADLs: Minimal assistance General ADL Comments: Educated on technique to clean under right armpit as pt had left shoulder pain and difficulty reaching under RUE. Discussed getting 3 in 1.     Vision     Perception     Praxis      Pertinent Vitals/Pain Pain Assessment: 0-10 Pain Score: 8  Pain Location: left shoulder Pain Descriptors / Indicators: Grimacing Pain  Intervention(s): Monitored during session;Limited activity within patient's tolerance     Hand Dominance     Extremity/Trunk Assessment Upper Extremity Assessment Upper Extremity Assessment: Generalized weakness;LUE deficits/detail LUE Deficits / Details: friend reports that pt has bursitis in left shoulder; limited ROM-pain in left shoulder   Lower Extremity Assessment Lower Extremity Assessment: Defer to PT evaluation       Communication Communication Communication: Other (comment) (seemed to be slightly HOH)   Cognition Arousal/Alertness: Awake/alert Behavior During Therapy: WFL for tasks assessed/performed Overall Cognitive Status: Impaired/Different from baseline (dementia at baseline) Area of Impairment: Problem solving;Orientation Orientation Level: Disoriented to;Place           Problem Solving: Slow processing;Requires verbal cues     General Comments       Exercises       Shoulder Instructions      Home Living Family/patient expects to be discharged to:: Private residence Living Arrangements: Alone Available Help at Discharge: Friend(s);Neighbor;Available PRN/intermittently Type of Home: House Home Access: Other (comment) (1 threshold)           Bathroom Shower/Tub: Teacher, early years/pre: Standard     Home Equipment: Shower seat;Cane - single point          Prior Functioning/Environment Level of Independence: Independent with assistive device(s)  OT Diagnosis: Generalized weakness;Altered mental status   OT Problem List: Decreased strength;Pain;Impaired UE functional use;Decreased knowledge of precautions;Decreased knowledge of use of DME or AE;Decreased cognition;Decreased range of motion;Impaired balance (sitting and/or standing)   OT Treatment/Interventions: Self-care/ADL training;DME and/or AE instruction;Therapeutic activities;Patient/family education;Balance training;Cognitive  remediation/compensation;Therapeutic exercise    OT Goals(Current goals can be found in the care plan section) Acute Rehab OT Goals Patient Stated Goal: not stated OT Goal Formulation: With patient Time For Goal Achievement: 02/21/15 Potential to Achieve Goals: Good ADL Goals Pt Will Perform Lower Body Bathing: with set-up;sit to/from stand Pt Will Perform Lower Body Dressing: with set-up;sit to/from stand Pt Will Transfer to Toilet: with supervision;ambulating;regular height toilet Pt Will Perform Toileting - Clothing Manipulation and hygiene: with modified independence;sit to/from stand  OT Frequency: Min 2X/week   Barriers to D/C:            Co-evaluation              End of Session Equipment Utilized During Treatment: Gait belt  Activity Tolerance: Patient tolerated treatment well Patient left: in chair;with call bell/phone within reach;with chair alarm set;with family/visitor present   Time: 9179-1505 OT Time Calculation (min): 19 min Charges:  OT General Charges $OT Visit: 1 Procedure OT Evaluation $Initial OT Evaluation Tier I: 1 Procedure G-CodesBenito Mccreedy OTR/L [id number] 02/14/2015, 2:59 PM

## 2015-02-14 NOTE — H&P (View-Only) (Signed)
HPI :  81 y/o female with history of melanoma seen in consultation for a suspected GI bleed and iron deficiency anemia. The patient presented to her PCM yesterday with fatigue and lightheadedness. She endorses having intermittent black tarry stools for "a few weeks", and did not think much of it. She also endorses epigastric / mid abdominal pain which can come and go. She endorses taking aspirin and other NSAIDs periodically for shoulder and hip pains. She thinks she may have a remote history of PUD, but her memory of this is not reliable. She endorses having a prior colonoscopy, she thinks 10 yrs ago and was normal, however none on file in her chart that can be seen. She has been nauseous with these symptoms but no vomiting. Otherwise no weight loss. Denies other changes in her bowel habits. She was started on protonix IV overnight. Her previous baseline Hgb prior to admission was 13.  Past Medical History  Diagnosis Date  . Hypertension   . Intracranial meningioma   . Melanoma     "I've had multile melanomas removed"  . Hypercholesterolemia   . Heart murmur dx'd 02/13/2015  . Asthma   . Anemia   . GERD (gastroesophageal reflux disease)   . Arthritis     "left shoulder; left hip" (02/13/2015)     Past Surgical History  Procedure Laterality Date  . Replacement total knee Right   . Joint replacement    . Appendectomy    . Total abdominal hysterectomy     History reviewed. No pertinent family history. Patient denies FH of CRC or gastric/esophageal CA. Social History  Substance Use Topics  . Smoking status: Never Smoker   . Smokeless tobacco: Never Used  . Alcohol Use: No   Current Facility-Administered Medications  Medication Dose Route Frequency Provider Last Rate Last Dose  . 0.9 %  sodium chloride infusion   Intravenous Once Barton Dubois, MD      . 0.9 %  sodium chloride infusion   Intravenous Continuous Barton Dubois, MD 50 mL/hr at 02/13/15 1805    . acetaminophen (TYLENOL)  tablet 650 mg  650 mg Oral Q6H PRN Barton Dubois, MD       Or  . acetaminophen (TYLENOL) suppository 650 mg  650 mg Rectal Q6H PRN Barton Dubois, MD      . Influenza vac split quadrivalent PF (FLUARIX) injection 0.5 mL  0.5 mL Intramuscular Tomorrow-1000 Barton Dubois, MD      . ondansetron [facility]) tablet 4 mg  4 mg Oral Q6H PRN Barton Dubois, MD       Or  . ondansetron [facility] injection 4 mg  4 mg Intravenous Q6H PRN Barton Dubois, MD      . pantoprazole (PROTONIX) injection 40 mg  40 mg Intravenous Q12H Barton Dubois, MD   40 mg at 02/13/15 2159  . polyethylene glycol (MIRALAX / GLYCOLAX) packet 17 g  17 g Oral Daily Barton Dubois, MD   17 g at 02/13/15 1805  . simvastatin (ZOCOR) tablet 20 mg  20 mg Oral q1800 Barton Dubois, MD   20 mg at 02/13/15 1805  . sodium chloride 0.9 % injection 3 mL  3 mL Intravenous Q12H Barton Dubois, MD   10 mL at 02/13/15 2159  . vitamin B-12 (CYANOCOBALAMIN) tablet 250 mcg  250 mcg Oral Daily Barton Dubois, MD   250 mcg at 02/13/15 1805   No Known Allergies   Review of Systems: All systems reviewed and negative except where noted in  HPI.    Ct Head Wo Contrast  02/14/2015   CLINICAL DATA:  Memory loss, dizziness. Hospital admission for leg swelling. Known meningioma, history of melanoma, hypertension.  EXAM: CT HEAD WITHOUT CONTRAST  TECHNIQUE: Contiguous axial images were obtained from the base of the skull through the vertex without intravenous contrast.  COMPARISON:  MRI of the brain June 08, 2014  FINDINGS: The ventricles and sulci are normal for age. No intraparenchymal hemorrhage, mass effect nor midline shift. Patchy supratentorial white matter hypodensities are within normal range for patient's age and though non-specific suggest sequelae of chronic small vessel ischemic disease. No acute large vascular territory infarcts. Symmetric basal ganglia and bilateral cerebellar mineralization.  No abnormal extra-axial fluid collections. 2 x 2.3 cm RIGHT  frontal extra-axial her calcified mass consistent with history of meningioma, subjacent mass effect and small hypodensity is unchanged. Basal cisterns are patent. Moderate calcific atherosclerosis of the carotid siphons.  No skull fracture. The included ocular globes and orbital contents are non-suspicious. The mastoid aircells and included paranasal sinuses are well-aerated. Moderate temporomandibular osteoarthrosis.  IMPRESSION: No acute intracranial process.  Stable 2 x 2.3 cm RIGHT frontal meningioma, similar subjacent mass effect and, mild edema versus encephalomalacia.  Involutional and, mild to moderate white matter changes compatible with chronic small vessel ischemic disease.   Electronically Signed   By: Elon Alas M.D.   On: 02/14/2015 00:16    Physical Exam: BP 143/54 mmHg  Pulse 66  Temp(Src) 98.4 F (36.9 C) (Oral)  Resp 18  Ht 5\' 2"  (1.575 m)  Wt 126 lb 15.8 oz (57.6 kg)  BMI 23.22 kg/m2  SpO2 100% Constitutional: Pleasant,well-developed, female in no acute distress. HEENT: Normocephalic and atraumatic. Conjunctivae are pale. No scleral icterus. Neck supple.  Cardiovascular: Normal rate, regular rhythm. 2/6 SEM  Pulmonary/chest: Effort normal and breath sounds normal. No wheezing, rales or rhonchi. Abdominal: Soft, nondistended, mild epigastric TTP without rebound or guarding. Bowel sounds active throughout.  Extremities: no edema Lymphadenopathy: No cervical adenopathy noted. Neurological: Alert and oriented to person place and time. Skin: Skin is warm and dry. No rashes noted. Psychiatric: Normal mood and affect. Behavior is normal.  Labs reviewed in Epic. Hgb 6.1 to 7.9 post transfusion of PRBC, baseline 13s, MCV 66, iron deficient, BUN/Cr normal    ASSESSMENT AND PLAN: 81 y/o female with PMH as above presenting with intermittent upper abdominal pain and what appears to be melena, admitted with symptomatic anemia, and noted to be iron deficient. Patient does  endorse NSAID use for her orthopedic pains. PUD is high on the differential given symptoms and her history, and recommend starting with an EGD to clear the upper GI tract. If this is negative, will then need to proceed with colonoscopy +/- capsule endoscopy to complete her workup. She endorses a prior normal colonoscopy and will attempt to get this record, but appears to have been done around 10 years ago. She has responded well to transfusion thus far, hemodynamically stable. Agree with PPI and will await results of EGD. The indications, risks, and benefits of EGD were explained to the patient in detail. Risks include but not limited to bleeding, perforation, adverse reaction to medications, cardiopulmonary compromise. Patient verbalized understanding and wished to proceed. Further recommendations pending the results. Please call with questions or changes in her status in the interim.    Cellar, MD [facility] Pager [phone]

## 2015-02-15 ENCOUNTER — Encounter (HOSPITAL_COMMUNITY): Payer: Self-pay | Admitting: Certified Registered"

## 2015-02-15 ENCOUNTER — Encounter (HOSPITAL_COMMUNITY)
Admission: AD | Disposition: A | Discharge status: Home / Self Care | Payer: Self-pay | Source: Ambulatory Visit | Attending: Internal Medicine

## 2015-02-15 ENCOUNTER — Other Ambulatory Visit (HOSPITAL_COMMUNITY): Payer: Medicare Other

## 2015-02-15 ENCOUNTER — Inpatient Hospital Stay (HOSPITAL_COMMUNITY): Payer: Medicare Other | Admitting: Certified Registered"

## 2015-02-15 DIAGNOSIS — E785 Hyperlipidemia, unspecified: Secondary | ICD-10-CM

## 2015-02-15 DIAGNOSIS — D6489 Other specified anemias: Secondary | ICD-10-CM

## 2015-02-15 DIAGNOSIS — F0390 Unspecified dementia without behavioral disturbance: Secondary | ICD-10-CM

## 2015-02-15 HISTORY — PX: COLONOSCOPY: SHX5424

## 2015-02-15 LAB — BASIC METABOLIC PANEL
ANION GAP: 9 (ref 5–15)
BUN: 11 mg/dL (ref 6–20)
CHLORIDE: 107 mmol/L (ref 101–111)
CO2: 24 mmol/L (ref 22–32)
Calcium: 8.2 mg/dL — ABNORMAL LOW (ref 8.9–10.3)
Creatinine, Ser: 0.96 mg/dL (ref 0.44–1.00)
GFR calc Af Amer: >60 mL/min (ref >60)
GFR calc non Af Amer: 54 mL/min — ABNORMAL LOW (ref >60)
GLUCOSE: 96 mg/dL (ref 65–99)
POTASSIUM: 3.5 mmol/L (ref 3.5–5.1)
SODIUM: 140 mmol/L (ref 135–145)

## 2015-02-15 LAB — TYPE AND SCREEN
ABO/RH(D): O POS
Antibody Screen: NEGATIVE
UNIT DIVISION: 0
Unit division: 0

## 2015-02-15 LAB — CBC
HEMATOCRIT: 27.9 % — AB (ref 36.0–46.0)
HEMOGLOBIN: 8.9 g/dL — AB (ref 12.0–15.0)
MCH: 23.5 pg — ABNORMAL LOW (ref 26.0–34.0)
MCHC: 31.9 g/dL (ref 30.0–36.0)
MCV: 73.6 fL — AB (ref 78.0–100.0)
Platelets: 318 10*3/uL (ref 150–400)
RBC: 3.79 MIL/uL — ABNORMAL LOW (ref 3.87–5.11)
RDW: 20.8 % — AB (ref 11.5–15.5)
WBC: 4.7 10*3/uL (ref 4.0–10.5)

## 2015-02-15 SURGERY — COLONOSCOPY
Anesthesia: Monitor Anesthesia Care

## 2015-02-15 MED ORDER — LACTATED RINGERS IV SOLN
INTRAVENOUS | Status: DC | PRN
  Administered 2015-02-15: 13:00:00 via INTRAVENOUS

## 2015-02-15 MED ORDER — PROPOFOL 10 MG/ML IV BOLUS
INTRAVENOUS | Status: DC | PRN
  Administered 2015-02-15 (×2): 10 mg via INTRAVENOUS

## 2015-02-15 MED ORDER — PROPOFOL INFUSION 10 MG/ML OPTIME
INTRAVENOUS | Status: DC | PRN
  Administered 2015-02-15: 75 ug/kg/min via INTRAVENOUS

## 2015-02-15 MED ORDER — LACTATED RINGERS IV SOLN
INTRAVENOUS | Status: DC
  Administered 2015-02-15: 12:00:00 via INTRAVENOUS

## 2015-02-15 MED ORDER — SODIUM CHLORIDE 0.9 % IV SOLN: INTRAVENOUS | Status: DC

## 2015-02-15 NOTE — Anesthesia Preprocedure Evaluation (Addendum)
Anesthesia Evaluation  Patient identified by MRN, date of birth, ID band Patient awake    Reviewed: Allergy & Precautions, NPO status , Patient's Chart, lab work & pertinent test results  History of Anesthesia Complications Negative for: history of anesthetic complications  Airway Mallampati: II  TM Distance: >3 FB Neck ROM: Full    Dental  (+) Teeth Intact   Pulmonary asthma ,    breath sounds clear to auscultation       Cardiovascular hypertension, Pt. on medications (-) angina(-) Past MI and (-) CHF + Valvular Problems/Murmurs AI  Rhythm:Regular     Neuro/Psych meningioma negative psych ROS   GI/Hepatic Neg liver ROS, GERD  Medicated,  Endo/Other  negative endocrine ROS  Renal/GU negative Renal ROS     Musculoskeletal  (+) Arthritis ,   Abdominal   Peds  Hematology  (+) anemia ,   Anesthesia Other Findings   Reproductive/Obstetrics                            Anesthesia Physical Anesthesia Plan  ASA: III  Anesthesia Plan: MAC   Post-op Pain Management:    Induction: Intravenous  Airway Management Planned: Nasal Cannula  Additional Equipment: None  Intra-op Plan:   Post-operative Plan:   Informed Consent: I have reviewed the patients History and Physical, chart, labs and discussed the procedure including the risks, benefits and alternatives for the proposed anesthesia with the patient or authorized representative who has indicated his/her understanding and acceptance.   Dental advisory given  Plan Discussed with: CRNA and Surgeon  Anesthesia Plan Comments:         Anesthesia Quick Evaluation

## 2015-02-15 NOTE — Care Management Note (Signed)
Case Management Note  Patient Details  Name: TAZIA ILLESCAS MRN: [id number] Date of Birth: 05-22-1930  Subjective/Objective:                 Patient from home, independent. Admitted for melena with anemia. Received blood transfusion. Patient states she drives, but also has friends and neighbors that go with her to doctor appointments. She denies any difficulties getting or paying for her medications. She states she has canes at home that she uses for longer distances or if it is snowing outside. PT recommend [place] [facility].  Anticipate discharge to home when medically stable.    Action/Plan:  [facility] referral made for [facility] PT. Expected Discharge Date:                  Expected Discharge Plan:  Home/Self Care  In-House Referral:     Discharge planning Services  CM Consult  Post Acute Care Choice:  Home Health Choice offered to:  Patient  DME Arranged:    DME Agency:     HH Arranged:  PT [place]:  [place]  Status of Service:  Completed, signed off  Medicare Important Message Given:    Date Medicare IM Given:    Medicare IM give by:    Date Additional Medicare IM Given:    Additional Medicare Important Message give by:     If discussed at [place] of Stay Meetings, dates discussed:    Additional Comments:  Carles Collet, RN 02/15/2015, 11:24 AM

## 2015-02-15 NOTE — Interval H&P Note (Signed)
History and Physical Interval Note:  02/15/2015 1:35 PM  Sheila West  has presented today for surgery, with the diagnosis of anemia  The various methods of treatment have been discussed with the patient and family. After consideration of risks, benefits and other options for treatment, the patient has consented to  Procedure(s): COLONOSCOPY (N/A) as a surgical intervention .  The patient's history has been reviewed, patient examined, no change in status, stable for surgery.  I have reviewed the patient's chart and labs.  Questions were answered to the patient's satisfaction.     Renelda Loma Kaylynn Chamblin

## 2015-02-15 NOTE — Op Note (Signed)
[facility] [place], [id number]   COLONOSCOPY PROCEDURE REPORT  PATIENT: Sheila, West  MR#: [id number] BIRTHDATE: 28-Nov-1929 , 85  yrs. old GENDER: female ENDOSCOPIST: [place] Cellar, MD REFERRED BY: PROCEDURE DATE:  02/15/2015 PROCEDURE:   Colonoscopy, diagnostic  ASA CLASS:   Class III INDICATIONS:anemia, non-specific. MEDICATIONS: Per Anesthesia  DESCRIPTION OF PROCEDURE:   After the risks benefits and alternatives of the procedure were thoroughly explained, informed consent was obtained.  The digital rectal exam revealed no abnormalities of the rectum.   The Pentax Ped Colon [id number] endoscope was introduced through the anus and advanced to the terminal ileum which was intubated for a short distance. No adverse events experienced.   The quality of the prep was adequate  The instrument was then slowly withdrawn as the colon was fully examined. Estimated blood loss is zero unless otherwise noted in this procedure report.      COLON FINDINGS: There was severe diverticulosis of the sigmoid colon, and mild diverticulosis of the descending colon.  The remainder of the examined colon appeared normal without pathology to account for her anemia.  The terminal ileum was intubated and appeared normal.  Retroflexed views revealed internal hemorrhoids. The time to cecum = 3.4 Withdrawal time = 13.4   The scope was withdrawn and the procedure completed. COMPLICATIONS: There were no immediate complications.  ENDOSCOPIC IMPRESSION: Sigmoid diverticulosis Normal remainder of examined colon Normal ileum  Overall, no pathology noted on today's exam to account for the patient's anemia.  RECOMMENDATIONS: Resume medications Recommend capsule endoscopy to clear the small bowel given normal EGD. We will try to do this as inpatient to expedite her workup, however timing of this will be pending availability of next capsule recorder GI  service will continue to follow  eSigned:  [place] Cellar, MD 02/15/2015 2:01 PM   cc:   PATIENT NAME:  Sheila, West MR#: [id number]

## 2015-02-15 NOTE — Progress Notes (Signed)
[facility] PROGRESS NOTE  Sheila West ZOX:[id number] DOB: [birth year] DOA: 02/13/2015 PCP: [name], MD  Assessment/Plan: Symptomatic anemia /Acute blood loss anemia/melana -Patient does have history of NSAID as well as aspirin use. Pt had complained of black stool. -Hemoglobin upon admission was 5.2 and pt was given 2 uPRBC -Anemia panel shows iron 11, ferritin 3 -Continue Protonix -Gastroenterology consultation appreciated -EGD done 9/13 and was Normal. Colonoscopy done 9/14 and was also unremarkable. Recs for capsule study to be started inpatient per GI recs.  -Colonoscopy planned for tomorrow, 02/15/2015 -Continue to monitor CBC  Hypertension -HCTZ held -Continue to monitor  Hyperlipidemia -Continue statin  GERD -Continue PPI  History of meningioma -Stable, currently no headache or vision changes -CT head: No acute intracranial process  Lower extremity swelling -BNP was 85.7 -Echocardiogram pending -Cont to monitor daily weights, intake and output -Possibly complicated by hypoalbuminemia versus venous insufficiency  Dementia -TSH and B-12 levels checked at primary care physician's office  -CT head showed no acute abnormalities  -Patient may likely benefit from the use of Namenda and/or Aricept  Epigastric/lower chest pain -Troponins cycled and found to be negative 3  -Continue PPI   Code Status: Full Family Communication: Pt in room (indicate person spoken with, relationship, and if by phone, the number) Disposition Plan: Pending   Consultants:  GI  Procedures:  EGD 9/13  Colonoscopy 9/14  Antibiotics:    HPI/Subjective: Feels cold post-endoscopy.   Objective: Filed Vitals:   02/15/15 1410 02/15/15 1415 02/15/15 1420 02/15/15 1509  BP:  132/100 146/117 154/62  Pulse: 37  82 73  Temp:    97.6 F (36.4 C)  TempSrc:    Oral  Resp: 20 17 22 22   Height:      Weight:      SpO2: 85%  99% 100%    Intake/Output  Summary (Last 24 hours) at 02/15/15 1736 Last data filed at 02/15/15 1404  Gross per 24 hour  Intake   1397 ml  Output   1650 ml  Net   -253 ml   Filed Weights   02/13/15 1956 02/14/15 0600 02/15/15 0604  Weight: 58.06 kg (128 lb) 57.6 kg (126 lb 15.8 oz) 57.5 kg (126 lb 12.2 oz)    Exam:   General:  Awake, in nad  Cardiovascular: regular, s1, s2  Respiratory: normal resp effort, no wheezing  Abdomen: soft,nondistended  Musculoskeletal: perfused, no clubbing   Data Reviewed: Basic Metabolic Panel:  Recent Labs Lab 02/13/15 1829 02/15/15 0545  NA 137 140  K 3.9 3.5  CL 103 107  CO2 25 24  GLUCOSE 119* 96  BUN 19 11  CREATININE 1.09* 0.96  CALCIUM 8.8* 8.2*  MG 2.1  --   PHOS 4.0  --    Liver Function Tests:  Recent Labs Lab 02/13/15 1829  AST 17  ALT 10*  ALKPHOS 53  BILITOT 0.4  PROT 6.4*  ALBUMIN 3.3*   No results for input(s): LIPASE, AMYLASE in the last 168 hours. No results for input(s): AMMONIA in the last 168 hours. CBC:  Recent Labs Lab 02/13/15 1829 02/14/15 0513 02/14/15 1631 02/15/15 0545  WBC 5.8 5.8 5.8 4.7  HGB 6.1* 7.9* 9.3* 8.9*  HCT 20.2* 25.3* 29.5* 27.9*  MCV 66.9* 73.5* 73.0* 73.6*  PLT 414* 308 343 318   Cardiac Enzymes:  Recent Labs Lab 02/13/15 1829 02/13/15 2308 02/14/15 0513  TROPONINI <0.03 <0.03 <0.03   BNP (last 3 results)  Recent Labs  02/13/15 1829  BNP 85.7    ProBNP (last 3 results) No results for input(s): PROBNP in the last 8760 hours.  CBG: No results for input(s): GLUCAP in the last 168 hours.  No results found for this or any previous visit (from the past 240 hour(s)).   Studies: Ct Head Wo Contrast  02/14/2015   CLINICAL DATA:  Memory loss, dizziness. Hospital admission for leg swelling. Known meningioma, history of melanoma, hypertension.  EXAM: CT HEAD WITHOUT CONTRAST  TECHNIQUE: Contiguous axial images were obtained from the base of the skull through the vertex without  intravenous contrast.  COMPARISON:  MRI of the brain June 08, 2014  FINDINGS: The ventricles and sulci are normal for age. No intraparenchymal hemorrhage, mass effect nor midline shift. Patchy supratentorial white matter hypodensities are within normal range for patient's age and though non-specific suggest sequelae of chronic small vessel ischemic disease. No acute large vascular territory infarcts. Symmetric basal ganglia and bilateral cerebellar mineralization.  No abnormal extra-axial fluid collections. 2 x 2.3 cm RIGHT frontal extra-axial her calcified mass consistent with history of meningioma, subjacent mass effect and small hypodensity is unchanged. Basal cisterns are patent. Moderate calcific atherosclerosis of the carotid siphons.  No skull fracture. The included ocular globes and orbital contents are non-suspicious. The mastoid aircells and included paranasal sinuses are well-aerated. Moderate temporomandibular osteoarthrosis.  IMPRESSION: No acute intracranial process.  Stable 2 x 2.3 cm RIGHT frontal meningioma, similar subjacent mass effect and, mild edema versus encephalomalacia.  Involutional and, mild to moderate white matter changes compatible with chronic small vessel ischemic disease.   Electronically Signed   By: Elon Alas M.D.   On: 02/14/2015 00:16    Scheduled Meds: . sodium chloride   Intravenous Once  . feeding supplement  1 Container Oral BID PC  . polyethylene glycol  17 g Oral Daily  . simvastatin  20 mg Oral q1800  . sodium chloride  3 mL Intravenous Q12H  . vitamin B-12  250 mcg Oral Daily   Continuous Infusions: . sodium chloride 50 mL/hr at 02/15/15 4944    Principal Problem:   Melena Active Problems:   Hypertension   Hyperlipidemia   GERD (gastroesophageal reflux disease)   Meningioma   Leg swelling   Dementia without behavioral disturbance   Iron deficiency anemia due to chronic blood loss   Anemia due to other cause    [facility], [place]  [facility] Pager [phone]. If 7PM-7AM, please contact night-coverage at [email or web address], password [facility] 02/15/2015, 5:36 PM  LOS: 2 days

## 2015-02-15 NOTE — Progress Notes (Signed)
Physical Therapy Treatment Patient Details Name: Sheila West MRN: [id number] DOB: 06-Jun-1929 Today's Date: 02/15/2015    History of Present Illness 81 year old female history of GERD, hypertension, meningioma, dementia presented for weakness and fatigue. Found to have a hemoglobin of 5.2 upon admission.     PT Comments    Pt actively participated during PT session.  She was able to ambulate using RW with improved stability and safety than without assistive device.  Continue PT POC and progress as tolerated.  PT continues to recommend HHPT upon discharge as well as RW for home use.  Follow Up Recommendations  Home health PT;Supervision/Assistance - 24 hour     Equipment Recommendations  Rolling walker with 5" wheels    Recommendations for Other Services       Precautions / Restrictions Precautions Precautions: Fall Restrictions Weight Bearing Restrictions: No    Mobility  Bed Mobility               General bed mobility comments: pt received sitting in chair  Transfers Overall transfer level: Needs assistance Equipment used: Rolling walker (2 wheeled) Transfers: Sit to/from Stand Sit to Stand: Supervision            Ambulation/Gait Ambulation/Gait assistance: Supervision Ambulation Distance (Feet): 100 Feet (50x2 with standing break) Assistive device: Rolling walker (2 wheeled) Gait Pattern/deviations: Step-through pattern     General Gait Details: pt states she feels more stable walking with RW   Stairs            Wheelchair Mobility    Modified Rankin (Stroke Patients Only)       Balance Overall balance assessment: Needs assistance Sitting-balance support: Feet supported Sitting balance-Leahy Scale: Good     Standing balance support: No upper extremity supported Standing balance-Leahy Scale: Fair                 High Level Balance Comments: some unsteadiness with turning but able to right self using RW    Cognition  Arousal/Alertness: Awake/alert Behavior During Therapy: WFL for tasks assessed/performed Overall Cognitive Status: Impaired/Different from baseline Area of Impairment: Orientation;Safety/judgement;Awareness;Problem solving         Safety/Judgement: Decreased awareness of safety   Problem Solving: Slow processing;Requires verbal cues      Exercises      General Comments        Pertinent Vitals/Pain Pain Assessment: No/denies pain    Home Living                      Prior Function            PT Goals (current goals can now be found in the care plan section) Acute Rehab PT Goals Patient Stated Goal: not stated PT Goal Formulation: Patient unable to participate in goal setting Time For Goal Achievement: 02/14/15 Potential to Achieve Goals: Good    Frequency  Min 3X/week    PT Plan Current plan remains appropriate    Co-evaluation             End of Session Equipment Utilized During Treatment: Gait belt;Other (comment) (RW) Activity Tolerance: Patient tolerated treatment well Patient left: Other (comment) (pt left with staff in transport chair to leave for testing)     Time: 1120-1130 PT Time Calculation (min) (ACUTE ONLY): 10 min  Charges:  $Gait Training: 8-22 mins                    G Codes:  Estill Bamberg Alvia Jablonski 02/15/2015, 12:47 PM  Lorita Officer, SPT

## 2015-02-15 NOTE — Anesthesia Postprocedure Evaluation (Signed)
  Anesthesia Post-op Note  Patient: Sheila West  Procedure(s) Performed: Procedure(s) (LRB): COLONOSCOPY (N/A)  Patient Location: PACU  Anesthesia Type: MAC  Level of Consciousness: awake and alert   Airway and Oxygen Therapy: Patient Spontanous Breathing  Post-op Pain: mild  Post-op Assessment: Post-op Vital signs reviewed, Patient's Cardiovascular Status Stable, Respiratory Function Stable, Patent Airway and No signs of Nausea or vomiting  Last Vitals:  Filed Vitals:   02/15/15 1509  BP: 154/62  Pulse: 73  Temp: 36.4 C  Resp: 22    Post-op Vital Signs: stable   Complications: No apparent anesthesia complications

## 2015-02-15 NOTE — Transfer of Care (Signed)
Immediate Anesthesia Transfer of Care Note  Patient: Sheila West  Procedure(s) Performed: Procedure(s): COLONOSCOPY (N/A)  Patient Location: Endoscopy Unit  Anesthesia Type:MAC  Level of Consciousness: sedated  Airway & Oxygen Therapy: Patient Spontanous Breathing and Patient connected to nasal cannula oxygen  Post-op Assessment: Report given to RN, Post -op Vital signs reviewed and stable and Patient moving all extremities X 4  Post vital signs: Reviewed and stable  Last Vitals:  Filed Vitals:   02/15/15 1142  BP: 173/66  Pulse: 80  Temp: 36.6 C  Resp: 11    Complications: No apparent anesthesia complications

## 2015-02-15 NOTE — Anesthesia Postprocedure Evaluation (Deleted)
  Anesthesia Post-op Note  Patient: Sheila West  Procedure(s) Performed: Procedure(s) (LRB): COLONOSCOPY (N/A)  Patient Location: PACU  Anesthesia Type: General  Level of Consciousness: awake and alert   Airway and Oxygen Therapy: Patient Spontanous Breathing  Post-op Pain: mild  Post-op Assessment: Post-op Vital signs reviewed, Patient's Cardiovascular Status Stable, Respiratory Function Stable, Patent Airway and No signs of Nausea or vomiting  Last Vitals:  Filed Vitals:   02/15/15 1509  BP: 154/62  Pulse: 73  Temp: 36.4 C  Resp: 22    Post-op Vital Signs: stable   Complications: No apparent anesthesia complications

## 2015-02-16 ENCOUNTER — Encounter (HOSPITAL_COMMUNITY): Payer: Self-pay | Admitting: Gastroenterology

## 2015-02-16 ENCOUNTER — Inpatient Hospital Stay (HOSPITAL_COMMUNITY): Payer: Medicare Other

## 2015-02-16 DIAGNOSIS — I10 Essential (primary) hypertension: Secondary | ICD-10-CM

## 2015-02-16 LAB — CBC
HCT: 24.4 % — ABNORMAL LOW (ref 36.0–46.0)
Hemoglobin: 7.5 g/dL — ABNORMAL LOW (ref 12.0–15.0)
MCH: 22.7 pg — ABNORMAL LOW (ref 26.0–34.0)
MCHC: 30.7 g/dL (ref 30.0–36.0)
MCV: 73.9 fL — AB (ref 78.0–100.0)
PLATELETS: 267 10*3/uL (ref 150–400)
RBC: 3.3 MIL/uL — AB (ref 3.87–5.11)
RDW: 21.7 % — AB (ref 11.5–15.5)
WBC: 3.7 10*3/uL — AB (ref 4.0–10.5)

## 2015-02-16 NOTE — Care Management Important Message (Signed)
Important Message  Patient Details  Name: Sheila West MRN: [id number] Date of Birth: Feb 11, 1930   Medicare Important Message Given:  Yes-second notification given    Nathen May 02/16/2015, 3:50 PM

## 2015-02-16 NOTE — Progress Notes (Signed)
Daily Rounding Note  02/16/2015, 11:45 AM  LOS: 3 days   SUBJECTIVE:       Feels okay. Not having pain. No dizziness. Feels a bit tired. Intermittent shortness of breath which is a chronic issue for her.  Has not had any bloody stool. No nausea. Remains on clear liquids.  OBJECTIVE:         Vital signs in last 24 hours:    Temp:  [97.5 F (36.4 C)-98.5 F (36.9 C)] 98.2 F (36.8 C) (09/15 0626) Pulse Rate:  [37-85] 63 (09/15 0626) Resp:  [16-22] 18 (09/15 0626) BP: (92-154)/(48-117) 111/48 mmHg (09/15 0626) SpO2:  [85 %-100 %] 95 % (09/15 0626) Weight:  [125 lb 3.5 oz (56.8 kg)-136 lb 7.4 oz (61.9 kg)] 125 lb 3.5 oz (56.8 kg) (09/15 0626) Last BM Date: 02/14/15 Filed Weights   02/15/15 0604 02/16/15 0500 02/16/15 0626  Weight: 126 lb 12.2 oz (57.5 kg) 136 lb 7.4 oz (61.9 kg) 125 lb 3.5 oz (56.8 kg)   General: Pleasant, frail, alert   Heart: RRR Chest: Clear bilaterally. No labored breathing, no cough. Abdomen: Not distended, not tender. Active bowel sounds  Extremities: No CCE. Neuro/Psych:  Slow to respond to questions but oriented to place, year and self. Moving all 4 limbs, strength not tested. No gross neurologic deficits  Intake/Output from previous day: 09/14 0701 - 09/15 0700 In: 728 [I.V.:728] Out: 925 [Urine:925]  Intake/Output this shift: Total I/O In: 450 [P.O.:450] Out: 300 [Urine:300]  Lab Results:  Recent Labs  02/14/15 1631 02/15/15 0545 02/16/15 0606  WBC 5.8 4.7 3.7*  HGB 9.3* 8.9* 7.5*  HCT 29.5* 27.9* 24.4*  PLT 343 318 267   BMET  Recent Labs  02/13/15 1829 02/15/15 0545  NA 137 140  K 3.9 3.5  CL 103 107  CO2 25 24  GLUCOSE 119* 96  BUN 19 11  CREATININE 1.09* 0.96  CALCIUM 8.8* 8.2*   LFT  Recent Labs  02/13/15 1829  PROT 6.4*  ALBUMIN 3.3*  AST 17  ALT 10*  ALKPHOS 53  BILITOT 0.4   PT/INR No results for input(s): LABPROT, INR in the last 72  hours. Hepatitis Panel No results for input(s): HEPBSAG, HCVAB, HEPAIGM, HEPBIGM in the last 72 hours.  Studies/Results: No results found.   Scheduled Meds: . sodium chloride   Intravenous Once  . feeding supplement  1 Container Oral BID PC  . polyethylene glycol  17 g Oral Daily  . simvastatin  20 mg Oral q1800  . sodium chloride  3 mL Intravenous Q12H  . vitamin B-12  250 mcg Oral Daily   Continuous Infusions: . sodium chloride 50 mL/hr at 02/16/15 0258   PRN Meds:.acetaminophen **OR** acetaminophen, ondansetron **OR** ondansetron (ZOFRAN) IV   ASSESMENT:   *   Upper abdominal pain, possible melana, symptomatic anemia, iron deficiency.  02/15/2015 colonoscopy: Sigmoid diverticulosis, otherwise normal to the ileum. 02/14/2015 EGD: Normal, no findings to explain anemia.  Hemoglobin is down again today.  PLAN   *   Capsule endoscopy.  Do this tomorrow when she has been npo.  She understands and looks forward to completing the test. She and her best friend are wondering if it would be possible for her to go home tomorrow evening, after the 12 hours for the capsule endoscopy has expired  *  CBC in the morning.    Sheila West  02/16/2015, 11:45 AM Pager: [phone]  Attending Addendum: I  have taken an interval history, reviewed the chart, and examined the patient. I agree with the Advanced Practitioner's note and impression. Patient has had negative EGD, colonoscopy. Hgb downtrending although could be due to blood draws and fluids, she is not actively bleeding. Capsule planned for tomorrow. Repeat CBC in the AM. Hopefully have a report for them in the afternoon or evening.  Ridgeway Cellar, MD [facility]astro[facility] Pager [phone]

## 2015-02-16 NOTE — Progress Notes (Signed)
  Echocardiogram 2D Echocardiogram has been performed.  Jennette Dubin 02/16/2015, 9:57 AM

## 2015-02-16 NOTE — Progress Notes (Signed)
[facility] PROGRESS NOTE  KAYLEE TRIVETT WJX:[id number] DOB: 06-16-1929 DOA: 02/13/2015 PCP: [name], MD  Assessment/Plan: Symptomatic anemia /Acute blood loss anemia/melana -Patient does have history of NSAID as well as aspirin use. Pt had complained of black stool. -Hemoglobin upon admission was 5.2 and pt was given 2 uPRBC -Anemia panel shows iron 11, ferritin 3 -Continue Protonix -Gastroenterology consultation appreciated -EGD done 9/13 and was Normal. Colonoscopy done 9/14 and was also unremarkable.  -Continue to monitor CBC -Pt is recommended to have follow up capsule study, to be started on 9/16  Hypertension -HCTZ held -Continue to monitor  Hyperlipidemia -Continue statin  GERD -Continue PPI  History of meningioma -Stable, currently no headache or vision changes -CT head: No acute intracranial process  Lower extremity swelling -BNP was 85.7 -Echocardiogram pending -Cont to monitor daily weights, intake and output -Possibly complicated by hypoalbuminemia versus venous insufficiency  Dementia -TSH and B-12 levels checked at primary care physician's office  -CT head showed no acute abnormalities  -Patient may likely benefit from the use of Namenda and/or Aricept  Epigastric/lower chest pain -Troponins cycled and found to be negative 3  -Continue PPI   Code Status: Full Family Communication: Pt in room  Disposition Plan: Pending   Consultants:  GI  Procedures:  EGD 9/13  Colonoscopy 9/14  Antibiotics:    HPI/Subjective: Patient without complaints.   Objective: Filed Vitals:   02/15/15 2233 02/16/15 0500 02/16/15 0626 02/16/15 1427  BP: 92/71  111/48 136/55  Pulse: 85  63 88  Temp: 97.5 F (36.4 C)  98.2 F (36.8 C) 98.1 F (36.7 C)  TempSrc: Oral  Oral Oral  Resp: 18  18 18   Height:      Weight:  61.9 kg (136 lb 7.4 oz) 56.8 kg (125 lb 3.5 oz)   SpO2: 97%  95% 99%    Intake/Output Summary (Last 24 hours)  at 02/16/15 1544 Last data filed at 02/16/15 1010  Gross per 24 hour  Intake    978 ml  Output    625 ml  Net    353 ml   Filed Weights   02/15/15 0604 02/16/15 0500 02/16/15 0626  Weight: 57.5 kg (126 lb 12.2 oz) 61.9 kg (136 lb 7.4 oz) 56.8 kg (125 lb 3.5 oz)    Exam:   General:  Awake, in nad  Cardiovascular: regular, s1, s2  Respiratory: normal resp effort, no wheezing  Abdomen: soft,nondistended, pos BS  Musculoskeletal: perfused, no clubbing   Data Reviewed: Basic Metabolic Panel:  Recent Labs Lab 02/13/15 1829 02/15/15 0545  NA 137 140  K 3.9 3.5  CL 103 107  CO2 25 24  GLUCOSE 119* 96  BUN 19 11  CREATININE 1.09* 0.96  CALCIUM 8.8* 8.2*  MG 2.1  --   PHOS 4.0  --    Liver Function Tests:  Recent Labs Lab 02/13/15 1829  AST 17  ALT 10*  ALKPHOS 53  BILITOT 0.4  PROT 6.4*  ALBUMIN 3.3*   No results for input(s): LIPASE, AMYLASE in the last 168 hours. No results for input(s): AMMONIA in the last 168 hours. CBC:  Recent Labs Lab 02/13/15 1829 02/14/15 0513 02/14/15 1631 02/15/15 0545 02/16/15 0606  WBC 5.8 5.8 5.8 4.7 3.7*  HGB 6.1* 7.9* 9.3* 8.9* 7.5*  HCT 20.2* 25.3* 29.5* 27.9* 24.4*  MCV 66.9* 73.5* 73.0* 73.6* 73.9*  PLT 414* 308 343 318 267   Cardiac Enzymes:  Recent Labs Lab 02/13/15 1829 02/13/15 2308 02/14/15  Devol <0.03 <0.03 <0.03   BNP (last 3 results)  Recent Labs  02/13/15 1829  BNP 85.7    ProBNP (last 3 results) No results for input(s): PROBNP in the last 8760 hours.  CBG: No results for input(s): GLUCAP in the last 168 hours.  No results found for this or any previous visit (from the past 240 hour(s)).   Studies: No results found.  Scheduled Meds: . sodium chloride   Intravenous Once  . feeding supplement  1 Container Oral BID PC  . polyethylene glycol  17 g Oral Daily  . simvastatin  20 mg Oral q1800  . sodium chloride  3 mL Intravenous Q12H  . vitamin B-12  250 mcg Oral Daily    Continuous Infusions: . sodium chloride 50 mL/hr at 02/16/15 0258    Principal Problem:   Melena Active Problems:   Hypertension   Hyperlipidemia   GERD (gastroesophageal reflux disease)   Meningioma   Leg swelling   Dementia without behavioral disturbance   Iron deficiency anemia due to chronic blood loss   Anemia due to other cause    Tekesha Almgren, [place] [facility] Pager [phone]. If 7PM-7AM, please contact night-coverage at [email or web address], password [facility] 02/16/2015, 3:44 PM  LOS: 3 days

## 2015-02-17 ENCOUNTER — Encounter (HOSPITAL_COMMUNITY)
Admission: AD | Disposition: A | Discharge status: Home / Self Care | Payer: Self-pay | Source: Ambulatory Visit | Attending: Internal Medicine

## 2015-02-17 DIAGNOSIS — R413 Other amnesia: Secondary | ICD-10-CM

## 2015-02-17 HISTORY — PX: GIVENS CAPSULE STUDY: SHX5432

## 2015-02-17 LAB — CBC
HCT: 26 % — ABNORMAL LOW (ref 36.0–46.0)
Hemoglobin: 8.1 g/dL — ABNORMAL LOW (ref 12.0–15.0)
MCH: 23.6 pg — AB (ref 26.0–34.0)
MCHC: 31.2 g/dL (ref 30.0–36.0)
MCV: 75.8 fL — AB (ref 78.0–100.0)
PLATELETS: 296 10*3/uL (ref 150–400)
RBC: 3.43 MIL/uL — AB (ref 3.87–5.11)
RDW: 22.3 % — AB (ref 11.5–15.5)
WBC: 3.8 10*3/uL — ABNORMAL LOW (ref 4.0–10.5)

## 2015-02-17 SURGERY — IMAGING PROCEDURE, GI TRACT, INTRALUMINAL, VIA CAPSULE

## 2015-02-17 MED ORDER — FERROUS FUMARATE 325 (106 FE) MG PO TABS: 1.0000 | ORAL_TABLET | Freq: Every day | ORAL | Status: DC

## 2015-02-17 NOTE — Discharge Summary (Signed)
Physician Discharge Summary  Sheila West QQP:[id number] DOB: 11-Jun-1929 DOA: 02/13/2015  PCP: [name], MD  Admit date: 02/13/2015 Discharge date: 02/17/2015  Time spent: 20 minutes  Recommendations for Outpatient Follow-up:  1. Follow up with PCP in 1-2 weeks 2. Follow up with GI as scheduled  Discharge Diagnoses:  Principal Problem:   Melena Active Problems:   Hypertension   Hyperlipidemia   GERD (gastroesophageal reflux disease)   Meningioma   Leg swelling   Dementia without behavioral disturbance   Iron deficiency anemia due to chronic blood loss   Anemia due to other cause   Memory loss   Discharge Condition: Stable  Diet recommendation: Heart healthy diet  Filed Weights   02/16/15 0626 02/17/15 0650 02/17/15 0807  Weight: 56.8 kg (125 lb 3.5 oz) 59 kg (130 lb 1.1 oz) 58.968 kg (130 lb)    History of present illness:  Please see admit h and p from 9/12 for details. Briefly, pt presented with symptomatic anemia with melena. Patient was admitted for further work up.  Hospital Course:  Symptomatic anemia /Acute blood loss anemia/melana -Patient does have history of NSAID as well as aspirin use. Pt had complained of black stool. -Hemoglobin upon admission was 5.2 and pt was given 2 uPRBC -Anemia panel shows iron 11, ferritin 3 -Continue Protonix -Gastroenterology consultation appreciated -EGD done 9/13 and was Normal. Colonoscopy done 9/14 and was also unremarkable.  -Continue to monitor CBC, hgb 8.1 -Capsule study completed on 9/16, results of study anticipated over the weekend and patient will be notified of results  Hypertension -HCTZ was held -Continue to monitor  Hyperlipidemia -Continue statin  GERD -Continue PPI  History of meningioma -Stable, currently no headache or vision changes -CT head: No acute intracranial process  Lower extremity swelling -BNP was 85.7 -Echocardiogram pending -Cont to monitor daily weights, intake and  output -Possibly complicated by hypoalbuminemia versus venous insufficiency  Dementia -TSH and B-12 levels checked at primary care physician's office  -CT head showed no acute abnormalities  -Patient may likely benefit from the use of Namenda and/or Aricept  Epigastric/lower chest pain -Troponins cycled and found to be negative 3  -Continue PPI   Procedures:  EGD 9/13  Colonoscopy 9/14  Consultations:  GI  Discharge Exam: Filed Vitals:   02/17/15 0650 02/17/15 0652 02/17/15 0807 02/17/15 1443  BP:  147/74  152/56  Pulse:  84  79  Temp:  97.7 F (36.5 C)  98.2 F (36.8 C)  TempSrc:  Oral  Oral  Resp:  16  14  Height:   5\' 2"  (1.575 m)   Weight: 59 kg (130 lb 1.1 oz)  58.968 kg (130 lb)   SpO2:  100%  100%    General: Awake, in nad Cardiovascular: regular, s1, s2 Respiratory: normal resp effort, no wheezing  Discharge Instructions     Medication List    STOP taking these medications        aspirin EC 81 MG tablet     omeprazole 20 MG capsule  Commonly known as:  PRILOSEC      TAKE these medications        alendronate 70 MG tablet  Commonly known as:  FOSAMAX  Take 70 mg by mouth once a week. Take with a full glass of water on an empty stomach.     CALCIUM 600 + D PO  Take 1 tablet by mouth daily.     Diclofenac Sodium CR 100 MG 24 hr tablet  Take  100 mg by mouth daily.     ferrous fumarate 325 (106 FE) MG Tabs tablet  Commonly known as:  HEMOCYTE - 106 mg FE  Take 1 tablet (106 mg of iron total) by mouth daily.  Start taking on:  02/18/2015     furosemide 20 MG tablet  Commonly known as:  LASIX  Take 20 mg by mouth daily.     polyethylene glycol packet  Commonly known as:  MIRALAX / GLYCOLAX  Take 17 g by mouth daily as needed for moderate constipation.     simvastatin 20 MG tablet  Commonly known as:  ZOCOR  Take 20 mg by mouth daily at 6 PM.     vitamin B-12 250 MCG tablet  Commonly known as:  CYANOCOBALAMIN  Take 250 mcg by  mouth daily.     Vitamin D (Cholecalciferol) 1000 UNITS Tabs  Take 1,000 Units by mouth daily.       No Known Allergies Follow-up Information    Follow up with [facility].   Why:  HH PT/ OT RN HHA SW. Will call 24 to 38 hours after discharge to set up first home health visit.   Contact information:   [address] [place] [address] [phone]       Follow up with [name], MD. Schedule an appointment as soon as possible for a visit in 1 week.   Specialty:  Internal Medicine   Why:  Hospital follow up   Contact information:   [address] [place] [address] [phone]        The results of significant diagnostics from this hospitalization (including imaging, microbiology, ancillary and laboratory) are listed below for reference.    Significant Diagnostic Studies: Ct Head Wo Contrast  02/14/2015   CLINICAL DATA:  Memory loss, dizziness. Hospital admission for leg swelling. Known meningioma, history of melanoma, hypertension.  EXAM: CT HEAD WITHOUT CONTRAST  TECHNIQUE: Contiguous axial images were obtained from the base of the skull through the vertex without intravenous contrast.  COMPARISON:  MRI of the brain June 08, 2014  FINDINGS: The ventricles and sulci are normal for age. No intraparenchymal hemorrhage, mass effect nor midline shift. Patchy supratentorial white matter hypodensities are within normal range for patient's age and though non-specific suggest sequelae of chronic small vessel ischemic disease. No acute large vascular territory infarcts. Symmetric basal ganglia and bilateral cerebellar mineralization.  No abnormal extra-axial fluid collections. 2 x 2.3 cm RIGHT frontal extra-axial her calcified mass consistent with history of meningioma, subjacent mass effect and small hypodensity is unchanged. Basal cisterns are patent. Moderate calcific atherosclerosis of the carotid siphons.  No skull fracture. The  included ocular globes and orbital contents are non-suspicious. The mastoid aircells and included paranasal sinuses are well-aerated. Moderate temporomandibular osteoarthrosis.  IMPRESSION: No acute intracranial process.  Stable 2 x 2.3 cm RIGHT frontal meningioma, similar subjacent mass effect and, mild edema versus encephalomalacia.  Involutional and, mild to moderate white matter changes compatible with chronic small vessel ischemic disease.   Electronically Signed   By: Elon Alas M.D.   On: 02/14/2015 00:16    Microbiology: No results found for this or any previous visit (from the past 240 hour(s)).   Labs: Basic Metabolic Panel:  Recent Labs Lab 02/13/15 1829 02/15/15 0545  NA 137 140  K 3.9 3.5  CL 103 107  CO2 25 24  GLUCOSE 119* 96  BUN 19 11  CREATININE 1.09* 0.96  CALCIUM 8.8* 8.2*  MG 2.1  --   PHOS 4.0  --    Liver Function Tests:  Recent Labs Lab 02/13/15 1829  AST 17  ALT 10*  ALKPHOS 53  BILITOT 0.4  PROT 6.4*  ALBUMIN 3.3*   No results for input(s): LIPASE, AMYLASE in the last 168 hours. No results for input(s): AMMONIA in the last 168 hours. CBC:  Recent Labs Lab 02/14/15 0513 02/14/15 1631 02/15/15 0545 02/16/15 0606 02/17/15 0803  WBC 5.8 5.8 4.7 3.7* 3.8*  HGB 7.9* 9.3* 8.9* 7.5* 8.1*  HCT 25.3* 29.5* 27.9* 24.4* 26.0*  MCV 73.5* 73.0* 73.6* 73.9* 75.8*  PLT 308 343 318 267 296   Cardiac Enzymes:  Recent Labs Lab 02/13/15 1829 02/13/15 2308 02/14/15 0513  TROPONINI <0.03 <0.03 <0.03   BNP: BNP (last 3 results)  Recent Labs  02/13/15 1829  BNP 85.7    ProBNP (last 3 results) No results for input(s): PROBNP in the last 8760 hours.  CBG: No results for input(s): GLUCAP in the last 168 hours.  Signed:  CHIU, Orpah Melter  [facility] 02/17/2015, 5:54 PM

## 2015-02-17 NOTE — Care Management Note (Signed)
Case Management Note  Patient Details  Name: Sheila West MRN: [id number] Date of Birth: 1929-08-28  Subjective/Objective:                 Patient from home alone, relies on help from neighbors. Admitted for melena with anemia. Received blood transfusion. Patient states she drives, but also has friends and neighbors that go with her to doctor appointments. She denies any difficulties getting or paying for her medications. She states she has canes at home that she uses for longer distances or if it is snowing outside. PT recommend [facility]. Anticipate discharge to home when medically stable.    Action/Plan:  Referral made for [facility] PT OT RN HHA and SW to [facility].   Expected Discharge Date:                  Expected Discharge Plan:  [place]  In-House Referral:     Discharge planning Services  CM Consult  Post Acute Care Choice:  Home Health Choice offered to:  Patient  DME Arranged:    DME Agency:     HH Arranged:  PT, OT, Nurse's Aide, RN, Social Work [employer] Agency:  [facility]  Status of Service:  Completed, signed off  Medicare Important Message Given:  Yes-second notification given Date Medicare IM Given:    Medicare IM give by:    Date Additional Medicare IM Given:    Additional Medicare Important Message give by:     If discussed at [place] of Stay Meetings, dates discussed:    Additional Comments:  Carles Collet, RN 02/17/2015, 1:50 PM

## 2015-02-17 NOTE — Progress Notes (Signed)
Physical Therapy Treatment Patient Details Name: Sheila West MRN: [id number] DOB: 09/10/1929 Today's Date: 02/17/2015    History of Present Illness 81 year old female history of GERD, hypertension, meningioma, dementia presented for weakness and fatigue. Found to have a hemoglobin of 5.2 upon admission.     PT Comments    Pt is making good progress with mobility. Pt reports she lives alone and does the driving and shopping. Pt presents with some decreased cognition (problem solving/slow to processing). D/C plan is for home and HHPT follow-up. Pt is currently supervision with mobility. Pt reports she has someone who checks in on her. Current recommendation is home with supervision due to the above issues. Unsure if pt will have assistance after d/c. HHPT to follow-up and perform a home safety evaluation. Pt will continue to benefit from continued acute PT until d/c.  Follow Up Recommendations  Home health PT;Supervision/Assistance - 24 hour     Equipment Recommendations       Recommendations for Other Services       Precautions / Restrictions Precautions Precautions: Fall Restrictions Weight Bearing Restrictions: No    Mobility  Bed Mobility Overal bed mobility: Modified Independent             General bed mobility comments: able to transition from supine to sit and oob with increased time, no assistance required to bring b les oob  Transfers Overall transfer level: Needs assistance Equipment used: None Transfers: Sit to/from Stand Sit to Stand: Supervision Stand pivot transfers: Supervision       General transfer comment: intermittent furniture walking and holding onto IV pole but no LOB noted  Ambulation/Gait Ambulation/Gait assistance: Min guard Ambulation Distance (Feet): 225 Feet Assistive device: None Gait Pattern/deviations: Step-through pattern   Gait velocity interpretation: Below normal speed for age/gender General Gait Details: pt with no LOB  during head turn with gait, but pt with decreased speed and a little unsteadiness noted.   Stairs            Wheelchair Mobility    Modified Rankin (Stroke Patients Only)       Balance Overall balance assessment: Needs assistance   Sitting balance-Leahy Scale: Normal     Standing balance support: No upper extremity supported Standing balance-Leahy Scale: Fair                 High Level Balance Comments: min guard assist with head turns during functional gait on level surfaces.    Cognition Arousal/Alertness: Awake/alert Behavior During Therapy: WFL for tasks assessed/performed Overall Cognitive Status: No family/caregiver present to determine baseline cognitive functioning Area of Impairment: Problem solving;Safety/judgement         Safety/Judgement: Decreased awareness of safety   Problem Solving: Slow processing;Requires verbal cues General Comments: slow to respond to questions, pt reported she is a little hard of hearing sometimes.    Exercises      General Comments        Pertinent Vitals/Pain Pain Assessment: No/denies pain    Home Living                      Prior Function            PT Goals (current goals can now be found in the care plan section) Progress towards PT goals: Progressing toward goals    Frequency  Min 3X/week    PT Plan      Co-evaluation  End of Session Equipment Utilized During Treatment: Gait belt Activity Tolerance: Patient tolerated treatment well Patient left: in chair;with call bell/phone within reach;with chair alarm set     Time: [phone] PT Time Calculation (min) (ACUTE ONLY): 32 min  Charges:  $Gait Training: 8-22 mins $Therapeutic Activity: 8-22 mins                    G Codes:      Lelon Mast 02/17/2015, 9:34 AM

## 2015-02-17 NOTE — Progress Notes (Signed)
PIV removed-pt tol well. Disch instr, f/u appt given-pt verbalizes understanding. Pt waiting for capsule study to end -20:23- before leaving hosp.

## 2015-02-17 NOTE — Progress Notes (Signed)
Occupational Therapy Treatment Patient Details Name: Sheila West MRN: [id number] DOB: [birth year] Today's Date: 02/17/2015    History of present illness 81 year old female history of GERD, hypertension, meningioma, dementia presented for weakness and fatigue. Found to have a hemoglobin of 5.2 upon admission.    OT comments  Pt. Making gains with acute OT goals.   Able to complete light grooming tasks and toileting with S. Note procedure scheduled for later today.  Will continue to follow acutely.  Follow Up Recommendations  Home health OT;Supervision/Assistance - 24 hour    Equipment Recommendations  3 in 1 bedside comode;Other (comment)    Recommendations for Other Services      Precautions / Restrictions Precautions Precautions: Fall       Mobility Bed Mobility Overal bed mobility: Modified Independent             General bed mobility comments: able to transition from supine to sit and oob with increased time, no assistance required to bring b les oob  Transfers Overall transfer level: Needs assistance Equipment used: None Transfers: Sit to/from [facility] Sit to Stand: Supervision Stand pivot transfers: Supervision       General transfer comment: intermittent furniture walking and holding onto IV pole but no LOB noted    Balance                                   ADL Overall ADL's : Needs assistance/impaired     Grooming: Wash/dry hands;Supervision/safety;Standing                   Toilet Transfer: Min guard;Ambulation;Regular Toilet;Grab bars   Toileting- Clothing Manipulation and Hygiene: Supervision/safety;Sitting/lateral lean       Functional mobility during ADLs: Min guard        Vision                     Perception     Praxis      Cognition   Behavior During Therapy: [facility] for tasks assessed/performed                         Extremity/Trunk Assessment                Exercises     Shoulder Instructions       General Comments      Pertinent Vitals/ Pain       Pain Assessment: No/denies pain  Home Living                                          Prior Functioning/Environment              Frequency Min 2X/week     Progress Toward Goals  OT Goals(current goals can now be found in the care plan section)  Progress towards OT goals: Progressing toward goals     Plan Discharge plan remains appropriate    Co-evaluation                 End of Session Equipment Utilized During Treatment: Gait belt   Activity Tolerance Patient tolerated treatment well   Patient Left in bed;with call bell/phone within reach;with bed alarm set   Nurse Communication          Time:  0725-0740 OT Time Calculation (min): 15 min  Charges: OT General Charges $OT Visit: 1 Procedure OT Treatments $Self Care/Home Management : 8-22 mins  Janice Coffin, COTA/L 02/17/2015, 7:44 AM

## 2015-02-17 NOTE — Progress Notes (Signed)
Daily Rounding Note  02/17/2015, 12:19 PM  LOS: 4 days   SUBJECTIVE:       Capsule endoscopy in progress.  Wants to go home after this completes tonight.  Feels well.  No BM for 2 days  OBJECTIVE:         Vital signs in last 24 hours:    Temp:  [97.7 F (36.5 C)-98.4 F (36.9 C)] 97.7 F (36.5 C) (09/16 0652) Pulse Rate:  [72-88] 84 (09/16 0652) Resp:  [16-18] 16 (09/16 0652) BP: (136-147)/(55-74) 147/74 mmHg (09/16 0652) SpO2:  [99 %-100 %] 100 % (09/16 6269) Weight:  [130 lb (58.968 kg)-130 lb 1.1 oz (59 kg)] 130 lb (58.968 kg) (09/16 0807) Last BM Date: 02/15/15 Filed Weights   02/16/15 0626 02/17/15 0650 02/17/15 0807  Weight: 125 lb 3.5 oz (56.8 kg) 130 lb 1.1 oz (59 kg) 130 lb (58.968 kg)   General: pleasant, comfortable.  Not ill looking   Heart: RRR Chest: clear bil, no dyspnea or cough Abdomen: soft, NT, ND.  BS active  Extremities: pedal swelling, mild and not pitting Neuro/Psych:  Pleasant, cooperative, appropriate.   Intake/Output from previous day: 09/15 0701 - 09/16 0700 In: 2034.2 [P.O.:1440; I.V.:594.2] Out: 300 [Urine:300]  Intake/Output this shift:    Lab Results:  Recent Labs  02/15/15 0545 02/16/15 0606 02/17/15 0803  WBC 4.7 3.7* 3.8*  HGB 8.9* 7.5* 8.1*  HCT 27.9* 24.4* 26.0*  PLT 318 267 296   BMET  Recent Labs  02/15/15 0545  NA 140  K 3.5  CL 107  CO2 24  GLUCOSE 96  BUN 11  CREATININE 0.96  CALCIUM 8.2*   LFT No results for input(s): PROT, ALBUMIN, AST, ALT, ALKPHOS, BILITOT, BILIDIR, IBILI in the last 72 hours. PT/INR No results for input(s): LABPROT, INR in the last 72 hours. Hepatitis Panel No results for input(s): HEPBSAG, HCVAB, HEPAIGM, HEPBIGM in the last 72 hours.  Studies/Results: No results found.   Scheduled Meds: . sodium chloride   Intravenous Once  . feeding supplement  1 Container Oral BID PC  . polyethylene glycol  17 g Oral Daily  .  simvastatin  20 mg Oral q1800  . sodium chloride  3 mL Intravenous Q12H  . vitamin B-12  250 mcg Oral Daily   Continuous Infusions: . sodium chloride 50 mL/hr at 02/16/15 2313   PRN Meds:.acetaminophen **OR** acetaminophen, ondansetron **OR** ondansetron (ZOFRAN) IV   ASSESMENT:   * Upper abdominal pain, possible melana, symptomatic anemia, iron deficiency.  02/15/2015 colonoscopy: Sigmoid diverticulosis, otherwise normal to the ileum. 02/14/2015 EGD: Normal, no findings to explain anemia.  Hemoglobin improved today.    PLAN   *  Given microcytosis, should be started on iron.  I wrote for this to start tomorrow so that it doesn't interfere with today's capsule endoscopy.  *  Capsule endo finishes tonight, staff from endo will retrieve equipment tomorrow AM.  MD will try to read this over the weekend, but no need to delay discharge for the reading.     Azucena Freed  02/17/2015, 12:19 PM Pager: [phone]  Attending Addendum:  I agree with the Advanced Practitioner's note and impression and saw and examined the patient. EGD and colonoscopy unremarkable. Capsule endoscopy ongoing now, hope to have report by the weekend. CBC has been stable, no active bleeding, okay to discharge home on oral iron once capsule study is complete. She will be contacted with capsule results. Further recommendations  pending this result.   Knollwood Cellar, MD [facility] Pager [phone]

## 2015-02-17 NOTE — Progress Notes (Signed)
Pt removed capsule belt at some point this evening, RN and family is not sure when exactly it was removed. Belt will be labeled and kept at the nurses station. Pt is now discharging with family and has all belongings in her possession.

## 2015-02-17 NOTE — Progress Notes (Signed)
[facility] PROGRESS NOTE  Sheila West SWN:[id number] DOB: Oct 12, 1929 DOA: 02/13/2015 PCP: [name], MD  Assessment/Plan: Symptomatic anemia /Acute blood loss anemia/melana -Patient does have history of NSAID as well as aspirin use. Pt had complained of black stool. -Hemoglobin upon admission was 5.2 and pt was given 2 uPRBC -Anemia panel shows iron 11, ferritin 3 -Continue Protonix -Gastroenterology consultation appreciated -EGD done 9/13 and was Normal. Colonoscopy done 9/14 and was also unremarkable.  -Continue to monitor CBC, hgb 8.1 -Capsule study started, anticipate ending later tonight  Hypertension -HCTZ held -Continue to monitor  Hyperlipidemia -Continue statin  GERD -Continue PPI  History of meningioma -Stable, currently no headache or vision changes -CT head: No acute intracranial process  Lower extremity swelling -BNP was 85.7 -Echocardiogram pending -Cont to monitor daily weights, intake and output -Possibly complicated by hypoalbuminemia versus venous insufficiency  Dementia -TSH and B-12 levels checked at primary care physician's office  -CT head showed no acute abnormalities  -Patient may likely benefit from the use of Namenda and/or Aricept  Epigastric/lower chest pain -Troponins cycled and found to be negative 3  -Continue PPI   Code Status: Full Family Communication: Pt in room  Disposition Plan: Pending   Consultants:  GI  Procedures:  EGD 9/13  Colonoscopy 9/14  Antibiotics:    HPI/Subjective: Pt is eager to go home   Objective: Filed Vitals:   02/17/15 0650 02/17/15 0652 02/17/15 0807 02/17/15 1443  BP:  147/74  152/56  Pulse:  84  79  Temp:  97.7 F (36.5 C)  98.2 F (36.8 C)  TempSrc:  Oral  Oral  Resp:  16  14  Height:   5\' 2"  (1.575 m)   Weight: 59 kg (130 lb 1.1 oz)  58.968 kg (130 lb)   SpO2:  100%  100%    Intake/Output Summary (Last 24 hours) at 02/17/15 1727 Last data filed at  02/17/15 1500  Gross per 24 hour  Intake 1384.17 ml  Output      0 ml  Net 1384.17 ml   Filed Weights   02/16/15 0626 02/17/15 0650 02/17/15 0807  Weight: 56.8 kg (125 lb 3.5 oz) 59 kg (130 lb 1.1 oz) 58.968 kg (130 lb)    Exam:   General:  Awake, in nad  Cardiovascular: regular, s1, s2  Respiratory: normal resp effort, no wheezing  Abdomen: soft,nondistended, pos BS, capsule vest in place  Musculoskeletal: perfused, no clubbing, no cyanosis  Data Reviewed: Basic Metabolic Panel:  Recent Labs Lab 02/13/15 1829 02/15/15 0545  NA 137 140  K 3.9 3.5  CL 103 107  CO2 25 24  GLUCOSE 119* 96  BUN 19 11  CREATININE 1.09* 0.96  CALCIUM 8.8* 8.2*  MG 2.1  --   PHOS 4.0  --    Liver Function Tests:  Recent Labs Lab 02/13/15 1829  AST 17  ALT 10*  ALKPHOS 53  BILITOT 0.4  PROT 6.4*  ALBUMIN 3.3*   No results for input(s): LIPASE, AMYLASE in the last 168 hours. No results for input(s): AMMONIA in the last 168 hours. CBC:  Recent Labs Lab 02/14/15 0513 02/14/15 1631 02/15/15 0545 02/16/15 0606 02/17/15 0803  WBC 5.8 5.8 4.7 3.7* 3.8*  HGB 7.9* 9.3* 8.9* 7.5* 8.1*  HCT 25.3* 29.5* 27.9* 24.4* 26.0*  MCV 73.5* 73.0* 73.6* 73.9* 75.8*  PLT 308 343 318 267 296   Cardiac Enzymes:  Recent Labs Lab 02/13/15 1829 02/13/15 2308 02/14/15 0513  TROPONINI <0.03 <0.03 <  0.03   BNP (last 3 results)  Recent Labs  02/13/15 1829  BNP 85.7    ProBNP (last 3 results) No results for input(s): PROBNP in the last 8760 hours.  CBG: No results for input(s): GLUCAP in the last 168 hours.  No results found for this or any previous visit (from the past 240 hour(s)).   Studies: No results found.  Scheduled Meds: . sodium chloride   Intravenous Once  . feeding supplement  1 Container Oral BID PC  . [START ON 02/18/2015] ferrous fumarate  1 tablet Oral Daily  . polyethylene glycol  17 g Oral Daily  . simvastatin  20 mg Oral q1800  . sodium chloride  3 mL  Intravenous Q12H  . vitamin B-12  250 mcg Oral Daily   Continuous Infusions: . sodium chloride 50 mL/hr at 02/16/15 2313    Principal Problem:   Melena Active Problems:   Hypertension   Hyperlipidemia   GERD (gastroesophageal reflux disease)   Meningioma   Leg swelling   Dementia without behavioral disturbance   Iron deficiency anemia due to chronic blood loss   Anemia due to other cause    CHIU, [facility] Pager [phone]. If 7PM-7AM, please contact night-coverage at [email or web address], password [facility] 02/17/2015, 5:27 PM  LOS: 4 days

## 2015-02-17 NOTE — Progress Notes (Signed)
Updated Sheila West POA, on plan for [facility] to provide [facility] RN HHA PT OT and SW after discharge.

## 2015-02-19 ENCOUNTER — Telehealth: Payer: Self-pay | Admitting: Gastroenterology

## 2015-02-19 DIAGNOSIS — K639 Disease of intestine, unspecified: Secondary | ICD-10-CM

## 2015-02-19 DIAGNOSIS — D62 Acute posthemorrhagic anemia: Secondary | ICD-10-CM

## 2015-02-19 NOTE — Telephone Encounter (Signed)
I called the patient to relay Capsule study which was read over the weekend. Her EGD and colonsocopy were negative for workup for her anemia. The capsule shows multiple areas of ulceration and inflammation of the mid and distal small bowel, with 2 ? small bowel polyps vs. prominent folds. I suspect this may be due to NSAIDs she has been taking, hopefully not Crohns. I am recommending she stop all aspirin and NSAIDs and take oral iron that was recommended on her discharge, with repeat CBC tomorrow. I also think she will warrant an MR enterography to ensure no small bowel polyp/mass lesion. I would also like to obtain ESR/CRP.  I called her to relay these concerns and her family was not with her. We spoke for a while about this but she was confused as to these instructions, and stated she did not understand. She denies any overt GI bleeding. Rollene Fare can you try to get ahold of her Monday morning and discuss these recommendations with her? I would like to speak with her family who was with her during her hospitalization to clarify instructions. Thanks

## 2015-02-20 ENCOUNTER — Other Ambulatory Visit: Payer: Self-pay | Admitting: *Deleted

## 2015-02-20 ENCOUNTER — Encounter (HOSPITAL_COMMUNITY): Payer: Self-pay | Admitting: Gastroenterology

## 2015-02-20 DIAGNOSIS — K6389 Other specified diseases of intestine: Secondary | ICD-10-CM

## 2015-02-20 DIAGNOSIS — D509 Iron deficiency anemia, unspecified: Secondary | ICD-10-CM

## 2015-02-20 MED ORDER — FERROUS SULFATE 325 (65 FE) MG PO TABS: 325.0000 mg | ORAL_TABLET | Freq: Every day | ORAL | Status: DC

## 2015-02-20 NOTE — Telephone Encounter (Signed)
Labs in EPIC. Scheduled MR enterography at [facility] on 03/02/15 8:00 AM. NPO after midnight. Mrs. Stewart aware.

## 2015-02-20 NOTE — Telephone Encounter (Signed)
I spoke with Mrs. Nicole Kindred per request of the patient. I discussed results of capsule and my concerns. Recommend the patient avoid all NSAIDs and have blood work done in the next day or so to ensure stable. I wrote her for iron supplementation as she has not yet been taking it. Hopefull these changes are from NSAIDs and resolve however will recommend MR enterography to ensure no small bowel polyps in relation to findings on capsule study , unclear if polypoid lesion was noted or if this was a fold, especially in light of her history of melanoma. I will await blood work, if she has any overt bleeding in the interim, she will contact us. She will relay all of this to the patient and ensure she understands. Rollene Fare, can you help her schedule the MR enterography. I ordered it under MRI abdomen. Thanks

## 2015-02-20 NOTE — Telephone Encounter (Signed)
Spoke with patient and she states that we can call Sheila West to give her results and recommendations. Sheila West [phone] or [phone]. Spoke with Sheila West and she will see that patient has lab done today. She states [name] is patient's power of attorney and his number is [phone] if needed.

## 2015-02-21 ENCOUNTER — Other Ambulatory Visit (INDEPENDENT_AMBULATORY_CARE_PROVIDER_SITE_OTHER): Payer: Medicare Other

## 2015-02-21 DIAGNOSIS — D509 Iron deficiency anemia, unspecified: Secondary | ICD-10-CM

## 2015-02-21 DIAGNOSIS — D62 Acute posthemorrhagic anemia: Secondary | ICD-10-CM | POA: Diagnosis not present

## 2015-02-21 DIAGNOSIS — K6389 Other specified diseases of intestine: Secondary | ICD-10-CM

## 2015-02-21 LAB — CBC WITH DIFFERENTIAL/PLATELET
BASOS PCT: 0.7 % (ref 0.0–3.0)
Basophils Absolute: 0 10*3/uL (ref 0.0–0.1)
EOS ABS: 0.2 10*3/uL (ref 0.0–0.7)
EOS PCT: 3.2 % (ref 0.0–5.0)
HCT: 28.3 % — ABNORMAL LOW (ref 36.0–46.0)
HEMOGLOBIN: 9 g/dL — AB (ref 12.0–15.0)
LYMPHS ABS: 1.2 10*3/uL (ref 0.7–4.0)
Lymphocytes Relative: 20.2 % (ref 12.0–46.0)
MCHC: 31.9 g/dL (ref 30.0–36.0)
MCV: 73.4 fl — ABNORMAL LOW (ref 78.0–100.0)
MONO ABS: 0.6 10*3/uL (ref 0.1–1.0)
Monocytes Relative: 10.4 % (ref 3.0–12.0)
NEUTROS ABS: 4 10*3/uL (ref 1.4–7.7)
NEUTROS PCT: 65.5 % (ref 43.0–77.0)
PLATELETS: 350 10*3/uL (ref 150.0–400.0)
RBC: 3.86 Mil/uL — ABNORMAL LOW (ref 3.87–5.11)
RDW: 26.7 % — AB (ref 11.5–15.5)
WBC: 6.1 10*3/uL (ref 4.0–10.5)

## 2015-02-21 LAB — SEDIMENTATION RATE: SED RATE: 34 mm/h — AB (ref 0–22)

## 2015-02-21 LAB — C-REACTIVE PROTEIN: CRP: 0.1 mg/dL — ABNORMAL LOW (ref 0.5–20.0)

## 2015-03-02 ENCOUNTER — Other Ambulatory Visit: Payer: Self-pay | Admitting: *Deleted

## 2015-03-02 ENCOUNTER — Ambulatory Visit (HOSPITAL_COMMUNITY)
Admission: RE | Admit: 2015-03-02 | Discharge: 2015-03-02 | Disposition: A | Discharge status: Home / Self Care | Payer: Medicare Other | Source: Ambulatory Visit | Attending: Gastroenterology | Admitting: Gastroenterology

## 2015-03-02 ENCOUNTER — Ambulatory Visit (HOSPITAL_COMMUNITY): Admission: RE | Admit: 2015-03-02 | Payer: Medicare Other | Source: Ambulatory Visit

## 2015-03-02 DIAGNOSIS — D649 Anemia, unspecified: Secondary | ICD-10-CM

## 2015-03-02 DIAGNOSIS — K639 Disease of intestine, unspecified: Secondary | ICD-10-CM | POA: Diagnosis present

## 2015-03-02 DIAGNOSIS — K5730 Diverticulosis of large intestine without perforation or abscess without bleeding: Secondary | ICD-10-CM | POA: Insufficient documentation

## 2015-03-02 DIAGNOSIS — D62 Acute posthemorrhagic anemia: Secondary | ICD-10-CM | POA: Insufficient documentation

## 2015-03-02 MED ORDER — GADOBENATE DIMEGLUMINE 529 MG/ML IV SOLN
15.0000 mL | Freq: Once | INTRAVENOUS | Status: AC | PRN
  Administered 2015-03-02: 12 mL via INTRAVENOUS

## 2015-08-23 DIAGNOSIS — Z0000 Encounter for general adult medical examination without abnormal findings: Secondary | ICD-10-CM | POA: Diagnosis not present

## 2015-08-24 DIAGNOSIS — H524 Presbyopia: Secondary | ICD-10-CM | POA: Diagnosis not present

## 2015-08-24 DIAGNOSIS — H11159 Pinguecula, unspecified eye: Secondary | ICD-10-CM | POA: Diagnosis not present

## 2015-12-28 ENCOUNTER — Ambulatory Visit (HOSPITAL_COMMUNITY)
Admission: EM | Admit: 2015-12-28 | Discharge: 2015-12-28 | Disposition: A | Discharge status: Home / Self Care | Payer: Medicare Other | Attending: Family Medicine | Admitting: Family Medicine

## 2015-12-28 ENCOUNTER — Ambulatory Visit (INDEPENDENT_AMBULATORY_CARE_PROVIDER_SITE_OTHER): Payer: Medicare Other

## 2015-12-28 ENCOUNTER — Encounter (HOSPITAL_COMMUNITY): Payer: Self-pay | Admitting: Emergency Medicine

## 2015-12-28 DIAGNOSIS — M25512 Pain in left shoulder: Secondary | ICD-10-CM

## 2015-12-28 DIAGNOSIS — W19XXXA Unspecified fall, initial encounter: Secondary | ICD-10-CM

## 2015-12-28 DIAGNOSIS — S8002XA Contusion of left knee, initial encounter: Secondary | ICD-10-CM | POA: Diagnosis not present

## 2015-12-28 DIAGNOSIS — M25562 Pain in left knee: Secondary | ICD-10-CM

## 2015-12-28 DIAGNOSIS — S7002XA Contusion of left hip, initial encounter: Secondary | ICD-10-CM | POA: Diagnosis not present

## 2015-12-28 MED ORDER — KETOROLAC TROMETHAMINE 30 MG/ML IJ SOLN
INTRAMUSCULAR | Status: AC
  Filled 2015-12-28: qty 1

## 2015-12-28 MED ORDER — KETOROLAC TROMETHAMINE 30 MG/ML IJ SOLN: 30.0000 mg | Freq: Once | INTRAMUSCULAR | Status: DC

## 2015-12-28 NOTE — ED Provider Notes (Signed)
CSN: [id number]     Arrival date & time 12/28/15  1236 History   First MD Initiated Contact with Patient 12/28/15 1426     Chief Complaint  Patient presents with  . Fall  . Shoulder Injury  . Knee Injury   (Consider location/radiation/quality/duration/timing/severity/associated sxs/prior Treatment) Patient was working in garden and fell today injuring her left shoulder, left hip, and left knee.  She is having difficulty ambulating.   The history is provided by the patient.  Fall  This is a new problem. The current episode started 1 to 2 hours ago. The problem occurs constantly. The problem has not changed since onset.The symptoms are aggravated by walking. The symptoms are relieved by rest. She has tried nothing for the symptoms.  Shoulder Injury     Past Medical History:  Diagnosis Date  . Anemia   . Arthritis    "left shoulder; left hip" (02/13/2015)  . Asthma   . GERD (gastroesophageal reflux disease)   . Heart murmur dx'd 02/13/2015  . Hypercholesterolemia   . Hypertension   . Intracranial meningioma ([place])   . Melanoma ([place])    "I've had multile melanomas removed"   Past Surgical History:  Procedure Laterality Date  . APPENDECTOMY    . COLONOSCOPY N/A 02/15/2015   Procedure: COLONOSCOPY;  Surgeon: Manus Gunning, MD;  Location: [facility] ENDOSCOPY;  Service: Gastroenterology;  Laterality: N/A;  . ESOPHAGOGASTRODUODENOSCOPY N/A 02/14/2015   Procedure: ESOPHAGOGASTRODUODENOSCOPY (EGD);  Surgeon: Manus Gunning, MD;  Location: [place];  Service: Gastroenterology;  Laterality: N/A;  . GIVENS CAPSULE STUDY N/A 02/17/2015   Procedure: GIVENS CAPSULE STUDY;  Surgeon: Manus Gunning, MD;  Location: [place];  Service: Gastroenterology;  Laterality: N/A;  . JOINT REPLACEMENT    . REPLACEMENT TOTAL KNEE Right   . TOTAL ABDOMINAL HYSTERECTOMY     No family history on file. Social History  Substance Use Topics  . Smoking status: Never Smoker  .  Smokeless tobacco: Never Used  . Alcohol use No   OB History    No data available     Review of Systems  Constitutional: Negative.   HENT: Negative.   Eyes: Negative.   Respiratory: Negative.   Cardiovascular: Negative.   Gastrointestinal: Negative.   Endocrine: Negative.   Genitourinary: Negative.   Musculoskeletal: Positive for arthralgias and myalgias.  Skin: Negative.   Allergic/Immunologic: Negative.   Neurological: Negative.   Hematological: Negative.   Psychiatric/Behavioral: Negative.     Allergies  Review of patient's allergies indicates no known allergies.  Home Medications   Prior to Admission medications   Medication Sig Start Date End Date Taking? Authorizing Provider  donepezil (ARICEPT) 10 MG tablet Take 10 mg by mouth at bedtime.   Yes Historical Provider, MD  omeprazole (PRILOSEC) 20 MG capsule Take 20 mg by mouth daily.   Yes Historical Provider, MD  alendronate (FOSAMAX) 70 MG tablet Take 70 mg by mouth once a week. Take with a full glass of water on an empty stomach.    Historical Provider, MD  Calcium Carb-Cholecalciferol (CALCIUM 600 + D PO) Take 1 tablet by mouth daily.    Historical Provider, MD  Diclofenac Sodium CR 100 MG 24 hr tablet Take 100 mg by mouth daily.    Historical Provider, MD  ferrous fumarate (HEMOCYTE - 106 MG FE) 325 (106 FE) MG TABS tablet Take 1 tablet (106 mg of iron total) by mouth daily. 02/18/15   Donne Hazel, MD  ferrous sulfate (FERROUSUL) 325 (  65 FE) MG tablet Take 1 tablet (325 mg total) by mouth daily with breakfast. 02/20/15   Manus Gunning, MD  furosemide (LASIX) 20 MG tablet Take 20 mg by mouth daily.    Historical Provider, MD  polyethylene glycol (MIRALAX / GLYCOLAX) packet Take 17 g by mouth daily as needed for moderate constipation. 08/19/13   Kingsley Spittle, MD  simvastatin (ZOCOR) 20 MG tablet Take 20 mg by mouth daily at 6 PM.     Historical Provider, MD  vitamin B-12 (CYANOCOBALAMIN) 250 MCG tablet Take  250 mcg by mouth daily.    Historical Provider, MD  Vitamin D, Cholecalciferol, 1000 UNITS TABS Take 1,000 Units by mouth daily.    Historical Provider, MD   Meds Ordered and Administered this Visit  Medications - No data to display  BP 152/70 (BP Location: Right Arm)   Pulse 74   Temp 98.7 F (37.1 C) (Oral)   Resp 16   SpO2 98%  No data found.   Physical Exam  Constitutional: She is oriented to person, place, and time. She appears well-developed and well-nourished.  HENT:  Head: Normocephalic and atraumatic.  Right Ear: External ear normal.  Eyes: Conjunctivae and EOM are normal. Pupils are equal, round, and reactive to light.  Neck: Normal range of motion. Neck supple.  Cardiovascular: Normal rate, regular rhythm and normal heart sounds.   Pulmonary/Chest: Effort normal and breath sounds normal.  Abdominal: Soft. Bowel sounds are normal.  Musculoskeletal: She exhibits tenderness.  TTP left shoulder and decreased ROM left shoulder. TTP left hip and tenderness with flexion of hip.  Tenderness with inversion or eversion left hip. TTP left posterior knee.  No swelling, ecchymosis, or deformities noted.  Neurological: She is alert and oriented to person, place, and time. She has normal reflexes.  Skin: Skin is warm and dry.    Urgent Care Course   Clinical Course    Procedures (including critical care time)  Labs Review Labs Reviewed - No data to display  Imaging Review Dg Shoulder Left  Result Date: 12/28/2015 CLINICAL DATA:  [place] today injuring the left shoulder with limited range of motion EXAM: LEFT SHOULDER - 2+ VIEW COMPARISON:  Chest x-ray of 01/27/2014 FINDINGS: There is significant degenerative joint disease of the left shoulder with deformity of the left humeral head, sclerosis, and spurring present. The left [facility] joint is normally aligned. There is a nodular opacity overlying the anterior left second rib. However in correlation with chest x-ray from 2015, this  probably represents a calcified granuloma within the left upper lobe and not a bony sclerotic lesion. IMPRESSION: 1. Significant degenerative joint disease of the left shoulder with deformity of the left humeral head. 2. No fracture. 3. Probable granuloma in the left upper lobe Electronically Signed   By: Ivar Drape M.D.   On: 12/28/2015 14:26  Dg Knee Complete 4 Views Left  Result Date: 12/28/2015 CLINICAL DATA:  [place] earlier today.  Knee pain. EXAM: LEFT KNEE - COMPLETE 4+ VIEW COMPARISON:  None. FINDINGS: No evidence of fracture or dislocation. Lateral view at his very oblique, therefore can't evaluate accurately for effusion. Small marginal osteophytes affect the weight-bearing compartments. IMPRESSION: No traumatic finding. Small weight-bearing compartment marginal osteophytes. Lateral view not sufficient to evaluate for effusion. Electronically Signed   By: Nelson Chimes M.D.   On: 12/28/2015 14:47  Dg Hip Unilat With Pelvis 2-3 Views Left  Result Date: 12/28/2015 CLINICAL DATA:  [place] earlier today with pain. EXAM: DG HIP (  WITH OR WITHOUT PELVIS) 2-3V LEFT COMPARISON:  None. FINDINGS: There is no evidence of hip fracture or dislocation. There is no evidence of arthropathy or other focal bone abnormality. IMPRESSION: Negative. Electronically Signed   By: Nelson Chimes M.D.   On: 12/28/2015 14:37    Visual Acuity Review  Right Eye Distance:   Left Eye Distance:   Bilateral Distance:    Right Eye Near:   Left Eye Near:    Bilateral Near:         MDM   1. Fall    Left shoulder pain Left knee pain Left knee contusion Left hip contusion  Reassured Patient and son that xrays were negative for acute pathology. Toradol 30 mg IM given.  Take tylenol otc as directed for pain. Patient has been taken off NSAIDs by PCP per son so tx pain with tylenol. Use walker at home to ambulate. Follow up with  PCP    Lysbeth Penner, FNP 12/28/15 [phone]

## 2015-12-28 NOTE — ED Triage Notes (Signed)
Pt. Stated, I was cleaning in the garden and I fell and hurt my left shoulder and left knee. No LOC, takes NO blood thinners.

## 2016-05-16 ENCOUNTER — Other Ambulatory Visit: Payer: Self-pay | Admitting: Neurosurgery

## 2016-05-16 DIAGNOSIS — D329 Benign neoplasm of meninges, unspecified: Secondary | ICD-10-CM

## 2016-05-23 ENCOUNTER — Ambulatory Visit
Admission: RE | Admit: 2016-05-23 | Discharge: 2016-05-23 | Disposition: A | Discharge status: Home / Self Care | Payer: Medicare Other | Source: Ambulatory Visit | Attending: Neurosurgery | Admitting: Neurosurgery

## 2016-05-23 DIAGNOSIS — D329 Benign neoplasm of meninges, unspecified: Secondary | ICD-10-CM

## 2016-05-23 MED ORDER — GADOBENATE DIMEGLUMINE 529 MG/ML IV SOLN
12.0000 mL | Freq: Once | INTRAVENOUS | Status: AC | PRN
  Administered 2016-05-23: 12 mL via INTRAVENOUS

## 2016-11-18 ENCOUNTER — Ambulatory Visit (HOSPITAL_COMMUNITY)
Admission: EM | Admit: 2016-11-18 | Discharge: 2016-11-18 | Disposition: A | Discharge status: Home / Self Care | Payer: Medicare Other | Attending: Family Medicine | Admitting: Family Medicine

## 2016-11-18 ENCOUNTER — Encounter (HOSPITAL_COMMUNITY): Payer: Self-pay | Admitting: Emergency Medicine

## 2016-11-18 DIAGNOSIS — H1033 Unspecified acute conjunctivitis, bilateral: Secondary | ICD-10-CM | POA: Diagnosis not present

## 2016-11-18 MED ORDER — SULFACETAMIDE SODIUM 10 % OP SOLN: 1.0000 [drp] | OPHTHALMIC | 0 refills | Status: AC

## 2016-11-18 NOTE — Discharge Instructions (Signed)
You are being treated for bacterial conjunctivitis, start sulfacetamide eye drop 1-2 drops every 4 hours for 10 days. Can use ice compresses to help with the itching. Over the counter allergy eye drops and artifical tears can also help with the itching. Wait 5-10 mins in between different eye drops.

## 2016-11-18 NOTE — ED Provider Notes (Signed)
CSN: [id number]     Arrival date & time 11/18/16  1923 History     Chief Complaint  Patient presents with  . Eye Problem    83 yo female with PMH of dementia is brought in by her neighbor/caregiver complaining of bilateral eye irritation. Patient lives on her own, with her neighbor dropping in as often as possible to check up on her. Neighbor states that she is unsure how long the eye irritation has been given patient is with dementia. Patient is experiencing redness around the eye and significant pruritis. Has had discharge of the eye, with both eyes matted shut in the morning. Increased watering of both eyes during the day. Neighbor states that patient has been working around the yard the past few days. Patient denies eye pain, vision changes, photophobia. Denies foreign body sensation. Denies cough, rhinitis, sore throat. Denies sneezing, sinus pressure, fever. No know history of seasonal allergies.       Past Medical History:  Diagnosis Date  . Anemia   . Arthritis    "left shoulder; left hip" (02/13/2015)  . Asthma   . GERD (gastroesophageal reflux disease)   . Heart murmur dx'd 02/13/2015  . Hypercholesterolemia   . Hypertension   . Intracranial meningioma ([place])   . Melanoma ([place])    "I've had multile melanomas removed"   Past Surgical History:  Procedure Laterality Date  . APPENDECTOMY    . COLONOSCOPY N/A 02/15/2015   Procedure: COLONOSCOPY;  Surgeon: Manus Gunning, MD;  Location: [facility] ENDOSCOPY;  Service: Gastroenterology;  Laterality: N/A;  . ESOPHAGOGASTRODUODENOSCOPY N/A 02/14/2015   Procedure: ESOPHAGOGASTRODUODENOSCOPY (EGD);  Surgeon: Manus Gunning, MD;  Location: [place];  Service: Gastroenterology;  Laterality: N/A;  . GIVENS CAPSULE STUDY N/A 02/17/2015   Procedure: GIVENS CAPSULE STUDY;  Surgeon: Manus Gunning, MD;  Location: [place];  Service: Gastroenterology;  Laterality: N/A;  . JOINT REPLACEMENT    . REPLACEMENT TOTAL KNEE  Right   . TOTAL ABDOMINAL HYSTERECTOMY     History reviewed. No pertinent family history. Social History  Substance Use Topics  . Smoking status: Never Smoker  . Smokeless tobacco: Never Used  . Alcohol use No   OB History    No data available     Review of Systems  Constitutional: Negative for chills and fever.  HENT: Negative for congestion, ear pain, facial swelling, postnasal drip, rhinorrhea, sinus pain, sinus pressure, sneezing and sore throat.   Eyes: Positive for discharge, redness and itching. Negative for photophobia, pain and visual disturbance.  Respiratory: Negative for cough, chest tightness, shortness of breath and wheezing.   Cardiovascular: Negative for chest pain and palpitations.  Allergic/Immunologic: Negative for environmental allergies.    Allergies  Patient has no known allergies.  Home Medications   Prior to Admission medications   Medication Sig Start Date End Date Taking? Authorizing Provider  donepezil (ARICEPT) 10 MG tablet Take 10 mg by mouth at bedtime.   Yes [provider]  furosemide (LASIX) 20 MG tablet Take 20 mg by mouth daily.   Yes [provider]  omeprazole (PRILOSEC) 20 MG capsule Take 20 mg by mouth daily.   Yes [provider]  simvastatin (ZOCOR) 20 MG tablet Take 20 mg by mouth daily at 6 PM.    Yes [provider]  alendronate (FOSAMAX) 70 MG tablet Take 70 mg by mouth once a week. Take with a full glass of water on an empty stomach.    [provider]  Calcium Carb-Cholecalciferol (CALCIUM 600 + D PO) Take 1 tablet by mouth daily.    [provider]  Diclofenac Sodium CR 100 MG 24 hr tablet Take 100 mg by mouth daily.    [provider]  ferrous fumarate (HEMOCYTE - 106 MG FE) 325 (106 FE) MG TABS tablet Take 1 tablet (106 mg of iron total) by mouth daily. 02/18/15   Donne Hazel, MD  ferrous sulfate (FERROUSUL) 325 (65 FE) MG tablet Take 1 tablet (325 mg total) by  mouth daily with breakfast. 02/20/15   Armbruster, Renelda Loma, MD  polyethylene glycol (MIRALAX / Floria Raveling) packet Take 17 g by mouth daily as needed for moderate constipation. 08/19/13   Kingsley Spittle, MD  sulfacetamide (BLEPH-10) 10 % ophthalmic solution Place 1-2 drops into both eyes every 4 (four) hours. 11/18/16 11/28/16  Ok Edwards, PA-C  vitamin B-12 (CYANOCOBALAMIN) 250 MCG tablet Take 250 mcg by mouth daily.    [provider]  Vitamin D, Cholecalciferol, 1000 UNITS TABS Take 1,000 Units by mouth daily.    [provider]   Meds Ordered and Administered this Visit  Medications - No data to display  BP (!) 181/47 (BP Location: Right Arm)   Pulse 71   Temp 98 F (36.7 C) (Oral)   Resp 18   SpO2 99%  No data found.   Physical Exam  Constitutional: She is oriented to person, place, and time. She appears well-developed and well-nourished. No distress.  HENT:  Head: Normocephalic and atraumatic.  Nose: Nose normal. Right sinus exhibits no maxillary sinus tenderness and no frontal sinus tenderness. Left sinus exhibits no maxillary sinus tenderness and no frontal sinus tenderness.  Mouth/Throat: Oropharynx is clear and moist.  Eyes: EOM are normal. Pupils are equal, round, and reactive to light.  Erythema around eyes bilaterally. Patient continues to rub eye throughout exam. Mild injection bilaterally. Eyes are watery during examination.   Cardiovascular: Normal rate and regular rhythm.  Exam reveals no gallop and no friction rub.   Murmur heard. Pulmonary/Chest: Effort normal and breath sounds normal.  Neurological: She is alert and oriented to person, place, and time.  Skin: Skin is warm and dry.  Psychiatric: She has a normal mood and affect. Her behavior is normal. Judgment normal.    Urgent Care Course     Procedures (including critical care time)  Labs Review Labs Reviewed - No data to display  Imaging Review No results found.        MDM   1.  Acute bacterial conjunctivitis of both eyes    1. Discussed with patient and neighbor, history and exam most consistent with bacterial conjunctivitis, given matting of the eye. Start Sulfacetamide 1-2 drops Q4H x 10 days. Neighbor states she and some other neighbors will be able to apply drops for patient and to assure compliance. They will also monitor for worsening of symptoms, such as vision changes, photophobia, pain.  2. Ice compress to help with itching. Can also use otc allergy eye drops and artificial tears. Encouraged patient to refrain from scratching to prevent injury the eye.     Ok Edwards, PA-C 11/18/16 2048

## 2016-11-18 NOTE — ED Triage Notes (Signed)
Caregiver brings pt in for bilateral eye irritation  Pt w/hx of dementia... Caregiver reports pt has been working in her yard  Alert... NAD... Ambulatory

## 2016-11-30 ENCOUNTER — Emergency Department (HOSPITAL_COMMUNITY)
Admission: EM | Admit: 2016-11-30 | Discharge: 2016-11-30 | Disposition: A | Discharge status: Home / Self Care | Payer: Medicare Other | Attending: Emergency Medicine | Admitting: Emergency Medicine

## 2016-11-30 ENCOUNTER — Emergency Department (HOSPITAL_COMMUNITY): Payer: Medicare Other

## 2016-11-30 ENCOUNTER — Encounter (HOSPITAL_COMMUNITY): Payer: Self-pay | Admitting: *Deleted

## 2016-11-30 DIAGNOSIS — J45909 Unspecified asthma, uncomplicated: Secondary | ICD-10-CM | POA: Insufficient documentation

## 2016-11-30 DIAGNOSIS — D649 Anemia, unspecified: Secondary | ICD-10-CM | POA: Diagnosis not present

## 2016-11-30 DIAGNOSIS — K219 Gastro-esophageal reflux disease without esophagitis: Secondary | ICD-10-CM

## 2016-11-30 DIAGNOSIS — Z79899 Other long term (current) drug therapy: Secondary | ICD-10-CM | POA: Insufficient documentation

## 2016-11-30 DIAGNOSIS — K5732 Diverticulitis of large intestine without perforation or abscess without bleeding: Secondary | ICD-10-CM | POA: Diagnosis not present

## 2016-11-30 DIAGNOSIS — R1084 Generalized abdominal pain: Secondary | ICD-10-CM | POA: Diagnosis present

## 2016-11-30 DIAGNOSIS — K5792 Diverticulitis of intestine, part unspecified, without perforation or abscess without bleeding: Secondary | ICD-10-CM

## 2016-11-30 DIAGNOSIS — N179 Acute kidney failure, unspecified: Secondary | ICD-10-CM | POA: Diagnosis not present

## 2016-11-30 LAB — COMPREHENSIVE METABOLIC PANEL
ALT: 15 U/L (ref 14–54)
AST: 19 U/L (ref 15–41)
Albumin: 4 g/dL (ref 3.5–5.0)
Alkaline Phosphatase: 78 U/L (ref 38–126)
Anion gap: 10 (ref 5–15)
BILIRUBIN TOTAL: 0.6 mg/dL (ref 0.3–1.2)
BUN: 15 mg/dL (ref 6–20)
CALCIUM: 9.4 mg/dL (ref 8.9–10.3)
CO2: 25 mmol/L (ref 22–32)
CREATININE: 1.15 mg/dL — AB (ref 0.44–1.00)
Chloride: 102 mmol/L (ref 101–111)
GFR calc Af Amer: 49 mL/min — ABNORMAL LOW (ref >60)
GFR calc non Af Amer: 42 mL/min — ABNORMAL LOW (ref >60)
Glucose, Bld: 100 mg/dL — ABNORMAL HIGH (ref 65–99)
Potassium: 4 mmol/L (ref 3.5–5.1)
Sodium: 137 mmol/L (ref 135–145)
TOTAL PROTEIN: 7.3 g/dL (ref 6.5–8.1)

## 2016-11-30 LAB — URINALYSIS, ROUTINE W REFLEX MICROSCOPIC
Bilirubin Urine: NEGATIVE
GLUCOSE, UA: NEGATIVE mg/dL
Hgb urine dipstick: NEGATIVE
KETONES UR: NEGATIVE mg/dL
Leukocytes, UA: NEGATIVE
NITRITE: NEGATIVE
PROTEIN: NEGATIVE mg/dL
Specific Gravity, Urine: 1.006 (ref 1.005–1.030)
pH: 6 (ref 5.0–8.0)

## 2016-11-30 LAB — CBC
HCT: 40.8 % (ref 36.0–46.0)
Hemoglobin: 13.6 g/dL (ref 12.0–15.0)
MCH: 29.8 pg (ref 26.0–34.0)
MCHC: 33.3 g/dL (ref 30.0–36.0)
MCV: 89.3 fL (ref 78.0–100.0)
PLATELETS: 238 10*3/uL (ref 150–400)
RBC: 4.57 MIL/uL (ref 3.87–5.11)
RDW: 13.6 % (ref 11.5–15.5)
WBC: 7.1 10*3/uL (ref 4.0–10.5)

## 2016-11-30 LAB — LIPASE, BLOOD: Lipase: 36 U/L (ref 11–51)

## 2016-11-30 MED ORDER — DEXTROSE 5 % IV SOLN
2.0000 g | Freq: Once | INTRAVENOUS | Status: AC
  Administered 2016-11-30: 2 g via INTRAVENOUS
  Filled 2016-11-30: qty 2

## 2016-11-30 MED ORDER — IOPAMIDOL (ISOVUE-300) INJECTION 61%
INTRAVENOUS | Status: AC
  Administered 2016-11-30: 80 mL
  Filled 2016-11-30: qty 100

## 2016-11-30 MED ORDER — SODIUM CHLORIDE 0.9 % IV BOLUS (SEPSIS)
500.0000 mL | Freq: Once | INTRAVENOUS | Status: AC
  Administered 2016-11-30: 500 mL via INTRAVENOUS

## 2016-11-30 MED ORDER — CIPROFLOXACIN HCL 500 MG PO TABS: 500.0000 mg | ORAL_TABLET | Freq: Two times a day (BID) | ORAL | 0 refills | Status: AC

## 2016-11-30 MED ORDER — METRONIDAZOLE IN NACL 5-0.79 MG/ML-% IV SOLN
500.0000 mg | Freq: Once | INTRAVENOUS | Status: AC
  Administered 2016-11-30: 500 mg via INTRAVENOUS
  Filled 2016-11-30: qty 100

## 2016-11-30 MED ORDER — METRONIDAZOLE 500 MG PO TABS: 500.0000 mg | ORAL_TABLET | Freq: Three times a day (TID) | ORAL | 0 refills | Status: AC

## 2016-11-30 NOTE — ED Triage Notes (Signed)
Pt reports left side abd pain that started last night. Denies n/v/d or or urinary symptoms.

## 2016-11-30 NOTE — ED Notes (Signed)
Pt given Sprite 

## 2016-11-30 NOTE — Discharge Instructions (Signed)
As discussed, take your full course of antibiotics even if you feel better after a few days. Stay well-hydrated, drink plenty of fluids and keep your urine clear. Liquid diet for the next 24 hours, then start reintroducing foods slowly starting with broths and bland foods. Follow-up with your primary care provider. Return to the emergency department if you experience increased pain, fever, chills, nausea, vomiting, diarrhea or any other new concerning symptoms in the meantime.

## 2016-11-30 NOTE — ED Notes (Signed)
Pt verbalized understanding discharge instructions and denies any further needs or questions at this time. VS stable, ambulatory and steady gait.   

## 2016-11-30 NOTE — ED Provider Notes (Signed)
[facility] DEPT Provider Note   CSN: [id number] Arrival date & time: 11/30/16  1057     History   Chief Complaint Chief Complaint  Patient presents with  . Abdominal Pain    HPI DOMINIK LAURICELLA is a 83 y.o. female presenting with sudden onset sharp left-sided abdominal pain that started in the middle of the night. Alleviated by lying down and worse by sitting up. She denies any pain at this time. She reports normal bowel movement yesterday, denies, nausea, vomiting, diarrhea, melena, blood in stool, fever, chills, dysuria, hematuria or other symptoms.  Son is in the room helping with history and reports that she called them stating that her left side was hurting her. She was recently diagnosed with dementia started on Aricept. He also explains that she may be diminishing her symptoms as she observed her having difficulty getting into the car from the house due to discomfort.  HPI  Past Medical History:  Diagnosis Date  . Anemia   . Arthritis    "left shoulder; left hip" (02/13/2015)  . Asthma   . GERD (gastroesophageal reflux disease)   . Heart murmur dx'd 02/13/2015  . Hypercholesterolemia   . Hypertension   . Intracranial meningioma ([place])   . Melanoma ([place])    "I've had multile melanomas removed"    Patient Active Problem List   Diagnosis Date Noted  . Memory loss   . Anemia due to other cause   . GERD (gastroesophageal reflux disease) 02/13/2015  . Meningioma ([place]) 02/13/2015  . Leg swelling 02/13/2015  . Dementia without behavioral disturbance 02/13/2015  . Iron deficiency anemia due to chronic blood loss 02/13/2015  . Melena 02/13/2015  . Chest pain 01/27/2014  . Hyperlipidemia 01/27/2014  . Hypertension     Past Surgical History:  Procedure Laterality Date  . APPENDECTOMY    . COLONOSCOPY N/A 02/15/2015   Procedure: COLONOSCOPY;  Surgeon: Manus Gunning, MD;  Location: [facility] ENDOSCOPY;  Service: Gastroenterology;  Laterality: N/A;  .  ESOPHAGOGASTRODUODENOSCOPY N/A 02/14/2015   Procedure: ESOPHAGOGASTRODUODENOSCOPY (EGD);  Surgeon: Manus Gunning, MD;  Location: [facility];  Service: Gastroenterology;  Laterality: N/A;  . GIVENS CAPSULE STUDY N/A 02/17/2015   Procedure: GIVENS CAPSULE STUDY;  Surgeon: Manus Gunning, MD;  Location: [facility];  Service: Gastroenterology;  Laterality: N/A;  . JOINT REPLACEMENT    . REPLACEMENT TOTAL KNEE Right   . TOTAL ABDOMINAL HYSTERECTOMY      OB History    No data available       Home Medications    Prior to Admission medications   Medication Sig Start Date End Date Taking? Authorizing Provider  Calcium Carb-Cholecalciferol (CALCIUM 600 + D PO) Take 1 tablet by mouth daily.   Yes [provider]  Diclofenac Sodium CR 100 MG 24 hr tablet Take 100 mg by mouth daily.   Yes [provider]  donepezil (ARICEPT) 10 MG tablet Take 10 mg by mouth at bedtime.   Yes [provider]  furosemide (LASIX) 20 MG tablet Take 20 mg by mouth daily.   Yes [provider]  omeprazole (PRILOSEC) 20 MG capsule Take 20 mg by mouth daily.   Yes [provider]  simvastatin (ZOCOR) 20 MG tablet Take 20 mg by mouth daily at 6 PM.    Yes [provider]  alendronate (FOSAMAX) 70 MG tablet Take 70 mg by mouth once a week. Take with a full glass of water on an empty stomach.  [provider]  ciprofloxacin (CIPRO) 500 MG tablet Take 1 tablet (500 mg total) by mouth 2 (two) times daily. 11/30/16 12/07/16  Avie Echevaria B, PA-C  ferrous fumarate (HEMOCYTE - 106 MG FE) 325 (106 FE) MG TABS tablet Take 1 tablet (106 mg of iron total) by mouth daily. Patient not taking: Reported on 11/30/2016 02/18/15   Donne Hazel, MD  ferrous sulfate (FERROUSUL) 325 (65 FE) MG tablet Take 1 tablet (325 mg total) by mouth daily with breakfast. Patient not taking: Reported on 11/30/2016 02/20/15   Armbruster, Renelda Loma, MD  metroNIDAZOLE  (FLAGYL) 500 MG tablet Take 1 tablet (500 mg total) by mouth 3 (three) times daily. 11/30/16 12/07/16  Avie Echevaria B, PA-C  polyethylene glycol (MIRALAX / GLYCOLAX) packet Take 17 g by mouth daily as needed for moderate constipation. Patient not taking: Reported on 11/30/2016 08/19/13   Kingsley Spittle, MD    Family History History reviewed. No pertinent family history.  Social History Social History  Substance Use Topics  . Smoking status: Never Smoker  . Smokeless tobacco: Never Used  . Alcohol use No     Allergies   Patient has no known allergies.   Review of Systems Review of Systems  Constitutional: Negative for chills and fever.  HENT: Negative for congestion, ear pain, sore throat and trouble swallowing.   Eyes: Negative for visual disturbance.  Respiratory: Negative for cough, chest tightness, shortness of breath, wheezing and stridor.   Cardiovascular: Negative for chest pain and leg swelling.  Gastrointestinal: Positive for abdominal pain. Negative for abdominal distention, blood in stool, diarrhea, nausea and vomiting.  Genitourinary: Negative for difficulty urinating, dysuria, flank pain, hematuria, pelvic pain and urgency.  Musculoskeletal: Negative for gait problem, neck pain and neck stiffness.  Skin: Negative for color change and pallor.  Neurological: Negative for dizziness, weakness, light-headedness and headaches.  Psychiatric/Behavioral: Negative for behavioral problems.     Physical Exam Updated Vital Signs BP (!) 113/97   Pulse 90   Temp 98.4 F (36.9 C) (Oral)   Resp 16   Ht 5\' 3"  (1.6 m)   Wt 58.5 kg (129 lb)   SpO2 100%   BMI 22.85 kg/m   Physical Exam  Constitutional: She appears well-developed and well-nourished. No distress.  Patient is afebrile, nontoxic-appearing, sitting comfortably in bed in no acute distress.  HENT:  Head: Normocephalic and atraumatic.  Eyes: Conjunctivae are normal. Right eye exhibits no discharge. Left eye  exhibits no discharge.  Neck: Neck supple.  Cardiovascular: Normal rate, regular rhythm, normal heart sounds and intact distal pulses.   Pulmonary/Chest: Effort normal and breath sounds normal. No respiratory distress. She has no wheezes. She has no rales.  Abdominal: Soft. Bowel sounds are normal. She exhibits no distension and no mass. There is tenderness. There is no rebound and no guarding.  Patient with CVA tenderness on the left. Some tenderness to palpation of the left lower quadrant. Flat contour, active bowel sounds. abdomen is supple and non-tender to both light and deep palpation elsewhere and no rebound tenderness. No palpated masses.  Negative murphy's sign Negative McBurney's point tenderness   Musculoskeletal: Normal range of motion. She exhibits no edema.  Neurological: She is alert.  Skin: Skin is warm and dry. She is not diaphoretic. No erythema. No pallor.  Psychiatric: She has a normal mood and affect.  Nursing note and vitals reviewed.    ED Treatments / Results  Labs (all labs ordered are listed, but only abnormal results  are displayed) Labs Reviewed  COMPREHENSIVE METABOLIC PANEL - Abnormal; Notable for the following:       Result Value   Glucose, Bld 100 (*)    Creatinine, Ser 1.15 (*)    GFR calc non Af Amer 42 (*)    GFR calc Af Amer 49 (*)    All other components within normal limits  URINALYSIS, ROUTINE W REFLEX MICROSCOPIC - Abnormal; Notable for the following:    Color, Urine STRAW (*)    All other components within normal limits  LIPASE, BLOOD  CBC    EKG  EKG Interpretation None       Radiology Ct Abdomen Pelvis W Contrast  Result Date: 11/30/2016 CLINICAL DATA:  Left-sided abdominal pain. EXAM: CT ABDOMEN AND PELVIS WITH CONTRAST TECHNIQUE: Multidetector CT imaging of the abdomen and pelvis was performed using the standard protocol following bolus administration of intravenous contrast. CONTRAST:  10mL ISOVUE-300 IOPAMIDOL (ISOVUE-300)  INJECTION 61% COMPARISON:  None. FINDINGS: Lower chest: No acute abnormality. Hepatobiliary: A few mm right hepatic cyst. Normal appearance of the gallbladder. Pancreas: Unremarkable. No pancreatic ductal dilatation or surrounding inflammatory changes. Spleen: No splenic injury or perisplenic hematoma. Adrenals/Urinary Tract: Adrenal glands are unremarkable. Kidneys are normal, without renal calculi, focal lesion, or hydronephrosis. Bladder is unremarkable. Stomach/Bowel: Stomach is within normal limits. Appendix appears normal. No evidence of small bowel wall thickening, distention, or inflammatory changes. Diffuse left colonic diverticulosis. Mild mucosal thickening of the mid and distal sigmoid colon on the background of multiple diverticula, likely represents diverticulitis. No significant pericolonic mesenteric inflammatory changes. Vascular/Lymphatic: Aortic atherosclerosis. No enlarged abdominal or pelvic lymph nodes. Reproductive: Status post hysterectomy. No adnexal masses. Other: Prior anterior abdominal wall surgical repair. Musculoskeletal: Advanced multilevel osteoarthritic changes of the lumbosacral spine, with posterior facet arthropathy. IMPRESSION: Sigmoid colon diverticulitis, probably chronic, on the background of severe left colonic diverticulosis. No evidence of acute abnormality within the solid abdominal organs. Advanced osteoarthritic changes of the lumbosacral spine. Advanced calcific atherosclerotic disease of the aorta. Electronically Signed   By: Fidela Salisbury M.D.   On: 11/30/2016 14:42    Procedures Procedures (including critical care time)  Medications Ordered in ED Medications  cefTRIAXone (ROCEPHIN) 2 g in dextrose 5 % 50 mL IVPB (not administered)    And  metroNIDAZOLE (FLAGYL) IVPB 500 mg (not administered)  sodium chloride 0.9 % bolus 500 mL (not administered)  sodium chloride 0.9 % bolus 500 mL (0 mLs Intravenous Stopped 11/30/16 1400)  iopamidol (ISOVUE-300) 61  % injection (80 mLs  Contrast Given 11/30/16 1414)     Initial Impression / Assessment and Plan / ED Course  I have reviewed the triage vital signs and the nursing notes.  Pertinent labs & imaging results that were available during my care of the patient were reviewed by me and considered in my medical decision making (see chart for details).    Patient presenting with left lower quadrant pain, left-sided CVA tenderness.  Ordered CT abdomen pelvis with contrast and will give bolus and reassess.  Exam otherwise unremarkable, labs unremarkable other than slightly elevated creatinine 1.15 from 0.96 year ago. She is afebrile, nontoxic-appearing and ready go home. Patient was given IV fluids and declined pain medication at this time.  CT shows evidence of severe left colonic diverticulitis. Discussed results with patient and recommended inpatient treatment, patient eventually agreed.  Admitting physician evaluated patient and patient declined admission. Although patient is of advanced age with new diagnosis of early dementia and lives alone, she is  afebrile, nontoxic-appearing, normal white count, and really wants to go home. No prior hx of same. Pain is managed without narcotics. Her son will be staying with her and care for her during recovery.  Patient was discussed with Dr. Laverta Baltimore who has seen patient and agrees with assessment and plan. Will discharge home with metronidazole, Cipro and close follow-up with PCP.  Discussed strict return precautions and advised to return to the emergency department if experiencing any new or worsening symptoms. Instructions were understood and patient agreed with discharge plan.  Final Clinical Impressions(s) / ED Diagnoses   Final diagnoses:  Diverticulitis of large intestine without perforation or abscess, unspecified bleeding status    New Prescriptions New Prescriptions   CIPROFLOXACIN (CIPRO) 500 MG TABLET    Take 1 tablet (500 mg total) by mouth  2 (two) times daily.   METRONIDAZOLE (FLAGYL) 500 MG TABLET    Take 1 tablet (500 mg total) by mouth 3 (three) times daily.     Emeline General, PA-C 11/30/16 1556    Margette Fast, MD 11/30/16 [phone]

## 2016-11-30 NOTE — Consult Note (Signed)
Medical Consultation   THAYER INABINET  PIR:[id number]  DOB: [birth year]  DOA: 11/30/2016  PCP: Lavone Orn, MD    Requesting physician: EDP  Reason for consultation: Left lower quadrant pain     History of Present Illness: Sheila West is an 83 y.o. female presenting to the ED with sudden onset of sharp, left-sided abdominal pain, starting in the middle of the night, waking her. This was alleviated by lying down, worse by sitting up. As she was approaching her car, her son noted that she could only ambulate by bending forward. The patient report normal bowel movements, last one yesterday. She denies any nausea vomiting diarrhea, Melinda blood in the stools, fever chills, dysuria hematuria, or leg swelling. At the emergency department, a CT of the abdomen and pelvis  confirms severe left colonic diverticulosis, and sigmoid colon diverticulitis, probably chroni.. Vital signs are stable, she is afebrile. WBC 7.1 . Received IV fluids. The patient declined IV narcotics for treatment of her pain. She wishes to be discharged from the ER.   Review of Systems:  As per HPI otherwise all other systems reviewed and are negative    Past Medical History: Past Medical History:  Diagnosis Date  . Anemia   . Arthritis    "left shoulder; left hip" (02/13/2015)  . Asthma   . GERD (gastroesophageal reflux disease)   . Heart murmur dx'd 02/13/2015  . Hypercholesterolemia   . Hypertension   . Intracranial meningioma ([place])   . Melanoma ([place])    "I've had multile melanomas removed"    Past Surgical History: Past Surgical History:  Procedure Laterality Date  . APPENDECTOMY    . COLONOSCOPY N/A 02/15/2015   Procedure: COLONOSCOPY;  Surgeon: Manus Gunning, MD;  Location: [facility] ENDOSCOPY;  Service: Gastroenterology;  Laterality: N/A;  . ESOPHAGOGASTRODUODENOSCOPY N/A 02/14/2015   Procedure: ESOPHAGOGASTRODUODENOSCOPY (EGD);  Surgeon: Manus Gunning, MD;  Location: [facility];  Service: Gastroenterology;  Laterality: N/A;  . GIVENS CAPSULE STUDY N/A 02/17/2015   Procedure: GIVENS CAPSULE STUDY;  Surgeon: Manus Gunning, MD;  Location: [facility];  Service: Gastroenterology;  Laterality: N/A;  . JOINT REPLACEMENT    . REPLACEMENT TOTAL KNEE Right   . TOTAL ABDOMINAL HYSTERECTOMY       Allergies:  No Known Allergies   Social History: Social History   Social History  . Marital status: Widowed    Spouse name: N/A  . Number of children: N/A  . Years of education: N/A   Occupational History  . Not on file.   Social History Main Topics  . Smoking status: Never Smoker  . Smokeless tobacco: Never Used  . Alcohol use No  . Drug use: No  . Sexual activity: No   Other Topics Concern  . Not on file   Social History Narrative  . No narrative on file       Family History: History reviewed. No pertinent family history.  Family history reviewed and not pertinent    Physical Exam: Vitals:   11/30/16 1244 11/30/16 1435 11/30/16 1545 11/30/16 1739  BP: (!) 149/54 (!) 113/97 (!) 157/83 (!) 155/86  Pulse: 61 90 77 76  Resp: 16 16 16 16   Temp:      TempSrc:      SpO2: 99% 100% 97% 98%  Weight:      Height:        Constitutional: Appears calm,  alert and awake, oriented x3, not in any acute distress. Eyes: PERLA, EOMI, irises appear normal, anicteric sclera,  ENMT: external ears and nose appear normal, normal hearing or hard of hearing.  Lips appears normal, oropharynx mucosa, tongue  Neck: neck appears normal, no masses, normal ROM, no thyromegaly, no JVD  CVS: S1-S2 clear, no murmur rubs or gallops, no LE edema, normal pedal pulses  Respiratory: clear to auscultation bilaterally, no wheezing, rales or rhonchi. Respiratory effort normal. No accessory muscle use.  Abdomen: soft TTP on the LLQ  nondistended, normal bowel sounds, no hepatosplenomegaly, no hernias NO rebound tenderness, no fluid wave  Musculoskeletal: no cyanosis,  clubbing or edema noted bilaterally.   Joint/bones/muscle exam, strength, contractures or atrophy Neuro: Cranial nerves II-XII intact, strength, sensation, reflexes Psych: judgement and insight appear normal, stable mood and affect, mental status  Skin: no rashes or lesions or ulcers, no induration or nodules   Data reviewed:  I have personally reviewed following labs and imaging studies Labs:  CBC:  Recent Labs Lab 11/30/16 1114  WBC 7.1  HGB 13.6  HCT 40.8  MCV 89.3  PLT 786    Basic Metabolic Panel:  Recent Labs Lab 11/30/16 1114  NA 137  K 4.0  CL 102  CO2 25  GLUCOSE 100*  BUN 15  CREATININE 1.15*  CALCIUM 9.4   GFR Estimated Creatinine Clearance: 30.1 mL/min (A) (by C-G formula based on SCr of 1.15 mg/dL (H)). Liver Function Tests:  Recent Labs Lab 11/30/16 1114  AST 19  ALT 15  ALKPHOS 78  BILITOT 0.6  PROT 7.3  ALBUMIN 4.0    Recent Labs Lab 11/30/16 1114  LIPASE 36   No results for input(s): AMMONIA in the last 168 hours. Coagulation profile No results for input(s): INR, PROTIME in the last 168 hours.  Cardiac Enzymes: No results for input(s): CKTOTAL, CKMB, CKMBINDEX, TROPONINI in the last 168 hours. BNP: Invalid input(s): POCBNP CBG: No results for input(s): GLUCAP in the last 168 hours. D-Dimer No results for input(s): DDIMER in the last 72 hours. Hgb A1c No results for input(s): HGBA1C in the last 72 hours. Lipid Profile No results for input(s): CHOL, HDL, LDLCALC, TRIG, CHOLHDL, LDLDIRECT in the last 72 hours. Thyroid function studies No results for input(s): TSH, T4TOTAL, T3FREE, THYROIDAB in the last 72 hours.  Invalid input(s): FREET3 Anemia work up No results for input(s): VITAMINB12, FOLATE, FERRITIN, TIBC, IRON, RETICCTPCT in the last 72 hours. Urinalysis    Component Value Date/Time   COLORURINE STRAW (A) 11/30/2016 1242   APPEARANCEUR CLEAR 11/30/2016 1242   LABSPEC 1.006 11/30/2016 1242   PHURINE 6.0 11/30/2016  1242   GLUCOSEU NEGATIVE 11/30/2016 1242   HGBUR NEGATIVE 11/30/2016 1242   BILIRUBINUR NEGATIVE 11/30/2016 1242   KETONESUR NEGATIVE 11/30/2016 1242   PROTEINUR NEGATIVE 11/30/2016 1242   UROBILINOGEN 0.2 05/21/2010 1825   NITRITE NEGATIVE 11/30/2016 1242   LEUKOCYTESUR NEGATIVE 11/30/2016 1242     Sepsis Labs Invalid input(s): PROCALCITONIN,  WBC,  LACTICIDVEN Microbiology No results found for this or any previous visit (from the past 240 hour(s)).     Inpatient Medications:   Scheduled Meds: Continuous Infusions:   Radiological Exams on Admission: Ct Abdomen Pelvis W Contrast  Result Date: 11/30/2016 CLINICAL DATA:  Left-sided abdominal pain. EXAM: CT ABDOMEN AND PELVIS WITH CONTRAST TECHNIQUE: Multidetector CT imaging of the abdomen and pelvis was performed using the standard protocol following bolus administration of intravenous contrast. CONTRAST:  21mL ISOVUE-300 IOPAMIDOL (ISOVUE-300) INJECTION 61%  COMPARISON:  None. FINDINGS: Lower chest: No acute abnormality. Hepatobiliary: A few mm right hepatic cyst. Normal appearance of the gallbladder. Pancreas: Unremarkable. No pancreatic ductal dilatation or surrounding inflammatory changes. Spleen: No splenic injury or perisplenic hematoma. Adrenals/Urinary Tract: Adrenal glands are unremarkable. Kidneys are normal, without renal calculi, focal lesion, or hydronephrosis. Bladder is unremarkable. Stomach/Bowel: Stomach is within normal limits. Appendix appears normal. No evidence of small bowel wall thickening, distention, or inflammatory changes. Diffuse left colonic diverticulosis. Mild mucosal thickening of the mid and distal sigmoid colon on the background of multiple diverticula, likely represents diverticulitis. No significant pericolonic mesenteric inflammatory changes. Vascular/Lymphatic: Aortic atherosclerosis. No enlarged abdominal or pelvic lymph nodes. Reproductive: Status post hysterectomy. No adnexal masses. Other: Prior  anterior abdominal wall surgical repair. Musculoskeletal: Advanced multilevel osteoarthritic changes of the lumbosacral spine, with posterior facet arthropathy. IMPRESSION: Sigmoid colon diverticulitis, probably chronic, on the background of severe left colonic diverticulosis. No evidence of acute abnormality within the solid abdominal organs. Advanced osteoarthritic changes of the lumbosacral spine. Advanced calcific atherosclerotic disease of the aorta. Electronically Signed   By: Fidela Salisbury M.D.   On: 11/30/2016 14:42    Impression/Recommendations Active Problems:   GERD (gastroesophageal reflux disease)   Acute diverticulitis   AKI (acute kidney injury) ([place])   Acute diverticulitis, presenting with acute onset of left lower quadrant tenderness, the patient is afebrile. White count is normal. CT of the abdomen and pelvis confirms severe left colonic diverticulosis, and sigmoid colon diverticulitis, probably chronic. Her condition was explained by MD, and as her vital signs are stable, she is afebrile, it was agreed by patient and MD, that her condition can be treated in the outpatient basis as long as someone can take care of her while on treatment as she lives alone. Her son offered to take care of her during this time Gastric and bowel rest, advance as tolerated  Continue to ingest liquids Ciprofloxacin and Flagyl prescription to be written by EDP    Recommend PPI daily , Analgesics and antiemetics as needed. Consider  GI and/ or surgical evaluation as outpatient  if no improvement.  Acute Kidney Injury likely due to dehydration  Cr 1.15, normal 1   Lab Results  Component Value Date   CREATININE 1.15 (H) 11/30/2016   CREATININE 0.96 02/15/2015   CREATININE 1.09 (H) 02/13/2015   Push fluids as OP    Other medical issues, including dementia, GERD  as per EDP.  Thank you for this consultation.      Time Spent:   Elease Hashimoto [facility] 11/30/2016, 5:44  PM

## 2016-11-30 NOTE — ED Notes (Signed)
Patient transported to CT 

## 2016-12-28 ENCOUNTER — Emergency Department (HOSPITAL_COMMUNITY)
Admission: EM | Admit: 2016-12-28 | Discharge: 2016-12-28 | Disposition: A | Discharge status: Home / Self Care | Payer: Medicare Other | Attending: Emergency Medicine | Admitting: Emergency Medicine

## 2016-12-28 ENCOUNTER — Encounter (HOSPITAL_COMMUNITY): Payer: Self-pay

## 2016-12-28 ENCOUNTER — Emergency Department (HOSPITAL_COMMUNITY): Payer: Medicare Other

## 2016-12-28 DIAGNOSIS — Z79899 Other long term (current) drug therapy: Secondary | ICD-10-CM | POA: Insufficient documentation

## 2016-12-28 DIAGNOSIS — D649 Anemia, unspecified: Secondary | ICD-10-CM | POA: Insufficient documentation

## 2016-12-28 DIAGNOSIS — I10 Essential (primary) hypertension: Secondary | ICD-10-CM | POA: Diagnosis not present

## 2016-12-28 DIAGNOSIS — R42 Dizziness and giddiness: Secondary | ICD-10-CM | POA: Diagnosis present

## 2016-12-28 DIAGNOSIS — J45909 Unspecified asthma, uncomplicated: Secondary | ICD-10-CM | POA: Diagnosis not present

## 2016-12-28 DIAGNOSIS — R0600 Dyspnea, unspecified: Secondary | ICD-10-CM | POA: Diagnosis not present

## 2016-12-28 LAB — COMPREHENSIVE METABOLIC PANEL
ALK PHOS: 55 U/L (ref 38–126)
ALT: 13 U/L — ABNORMAL LOW (ref 14–54)
ANION GAP: 9 (ref 5–15)
AST: 24 U/L (ref 15–41)
Albumin: 3.7 g/dL (ref 3.5–5.0)
BUN: 20 mg/dL (ref 6–20)
CALCIUM: 8.9 mg/dL (ref 8.9–10.3)
CO2: 26 mmol/L (ref 22–32)
Chloride: 101 mmol/L (ref 101–111)
Creatinine, Ser: 1.27 mg/dL — ABNORMAL HIGH (ref 0.44–1.00)
GFR calc Af Amer: 44 mL/min — ABNORMAL LOW (ref >60)
GFR calc non Af Amer: 38 mL/min — ABNORMAL LOW (ref >60)
Glucose, Bld: 122 mg/dL — ABNORMAL HIGH (ref 65–99)
Potassium: 3.6 mmol/L (ref 3.5–5.1)
SODIUM: 136 mmol/L (ref 135–145)
TOTAL PROTEIN: 6.6 g/dL (ref 6.5–8.1)
Total Bilirubin: 0.9 mg/dL (ref 0.3–1.2)

## 2016-12-28 LAB — URINALYSIS, ROUTINE W REFLEX MICROSCOPIC
Bilirubin Urine: NEGATIVE
Glucose, UA: NEGATIVE mg/dL
HGB URINE DIPSTICK: NEGATIVE
KETONES UR: NEGATIVE mg/dL
LEUKOCYTES UA: NEGATIVE
Nitrite: NEGATIVE
PROTEIN: NEGATIVE mg/dL
Specific Gravity, Urine: 1.006 (ref 1.005–1.030)
pH: 6 (ref 5.0–8.0)

## 2016-12-28 LAB — CBC
HCT: 37.8 % (ref 36.0–46.0)
HEMOGLOBIN: 12.6 g/dL (ref 12.0–15.0)
MCH: 29.6 pg (ref 26.0–34.0)
MCHC: 33.3 g/dL (ref 30.0–36.0)
MCV: 88.9 fL (ref 78.0–100.0)
Platelets: 233 10*3/uL (ref 150–400)
RBC: 4.25 MIL/uL (ref 3.87–5.11)
RDW: 13.6 % (ref 11.5–15.5)
WBC: 5.3 10*3/uL (ref 4.0–10.5)

## 2016-12-28 LAB — D-DIMER, QUANTITATIVE (NOT AT ARMC): D DIMER QUANT: 0.83 ug{FEU}/mL — AB (ref 0.00–0.50)

## 2016-12-28 LAB — I-STAT TROPONIN, ED: TROPONIN I, POC: 0 ng/mL (ref 0.00–0.08)

## 2016-12-28 MED ORDER — ALBUTEROL SULFATE (2.5 MG/3ML) 0.083% IN NEBU
5.0000 mg | INHALATION_SOLUTION | Freq: Once | RESPIRATORY_TRACT | Status: AC
  Administered 2016-12-28: 5 mg via RESPIRATORY_TRACT
  Filled 2016-12-28: qty 6

## 2016-12-28 NOTE — ED Provider Notes (Signed)
[facility] DEPT Provider Note   CSN: [id number] Arrival date & time: 12/28/16  4098     History   Chief Complaint Chief Complaint  Patient presents with  . Dizziness    HPI Sheila West is a 83 y.o. female. Level 5 caveat secondary to dementia HPI Patient with dementia lives alone but does not  Past Medical History:  Diagnosis Date  . Anemia   . Arthritis    "left shoulder; left hip" (02/13/2015)  . Asthma   . GERD (gastroesophageal reflux disease)   . Heart murmur dx'd 02/13/2015  . Hypercholesterolemia   . Hypertension   . Intracranial meningioma ([place])   . Melanoma ([place])    "I've had multile melanomas removed"    Patient Active Problem List   Diagnosis Date Noted  . Acute diverticulitis 11/30/2016  . AKI (acute kidney injury) ([place]) 11/30/2016  . Memory loss   . Anemia due to other cause   . GERD (gastroesophageal reflux disease) 02/13/2015  . Meningioma ([place]) 02/13/2015  . Leg swelling 02/13/2015  . Dementia without behavioral disturbance 02/13/2015  . Iron deficiency anemia due to chronic blood loss 02/13/2015  . Melena 02/13/2015  . Chest pain 01/27/2014  . Hyperlipidemia 01/27/2014  . Hypertension     Past Surgical History:  Procedure Laterality Date  . APPENDECTOMY    . COLONOSCOPY N/A 02/15/2015   Procedure: COLONOSCOPY;  Surgeon: Manus Gunning, MD;  Location: [facility] ENDOSCOPY;  Service: Gastroenterology;  Laterality: N/A;  . ESOPHAGOGASTRODUODENOSCOPY N/A 02/14/2015   Procedure: ESOPHAGOGASTRODUODENOSCOPY (EGD);  Surgeon: Manus Gunning, MD;  Location: [place];  Service: Gastroenterology;  Laterality: N/A;  . GIVENS CAPSULE STUDY N/A 02/17/2015   Procedure: GIVENS CAPSULE STUDY;  Surgeon: Manus Gunning, MD;  Location: [place];  Service: Gastroenterology;  Laterality: N/A;  . JOINT REPLACEMENT    . REPLACEMENT TOTAL KNEE Right   . TOTAL ABDOMINAL HYSTERECTOMY      OB History    No data available        Home Medications    Prior to Admission medications   Medication Sig Start Date End Date Taking? Authorizing Provider  alendronate (FOSAMAX) 70 MG tablet Take 70 mg by mouth once a week. Take with a full glass of water on an empty stomach.    [provider]  Calcium Carb-Cholecalciferol (CALCIUM 600 + D PO) Take 1 tablet by mouth daily.    [provider]  Diclofenac Sodium CR 100 MG 24 hr tablet Take 100 mg by mouth daily.    [provider]  donepezil (ARICEPT) 10 MG tablet Take 10 mg by mouth at bedtime.    [provider]  ferrous fumarate (HEMOCYTE - 106 MG FE) 325 (106 FE) MG TABS tablet Take 1 tablet (106 mg of iron total) by mouth daily. Patient not taking: Reported on 11/30/2016 02/18/15   Donne Hazel, MD  ferrous sulfate (FERROUSUL) 325 (65 FE) MG tablet Take 1 tablet (325 mg total) by mouth daily with breakfast. Patient not taking: Reported on 11/30/2016 02/20/15   Armbruster, Renelda Loma, MD  furosemide (LASIX) 20 MG tablet Take 20 mg by mouth daily.    [provider]  omeprazole (PRILOSEC) 20 MG capsule Take 20 mg by mouth daily.    [provider]  polyethylene glycol (MIRALAX / GLYCOLAX) packet Take 17 g by mouth daily as needed for moderate constipation. Patient not taking: Reported on 11/30/2016 08/19/13   Kingsley Spittle, MD  simvastatin (ZOCOR) 20  MG tablet Take 20 mg by mouth daily at 6 PM.     [provider]    Family History No family history on file.  Social History Social History  Substance Use Topics  . Smoking status: Never Smoker  . Smokeless tobacco: Never Used  . Alcohol use No     Allergies   Patient has no known allergies.   Review of Systems Review of Systems   Physical Exam Updated Vital Signs BP (!) 131/54 (BP Location: Right Arm)   Pulse (!) 59   Temp 98.1 F (36.7 C) (Oral)   Resp 15   SpO2 99%   Physical Exam  Constitutional: She is oriented to person, place, and  time. She appears well-developed and well-nourished. No distress.  HENT:  Head: Normocephalic and atraumatic.  Right Ear: External ear normal.  Left Ear: External ear normal.  Nose: Nose normal.  Eyes: Pupils are equal, round, and reactive to light. Conjunctivae and EOM are normal.  Neck: Normal range of motion. Neck supple.  Cardiovascular: Normal rate, regular rhythm and normal heart sounds.   Pulmonary/Chest: Effort normal and breath sounds normal. No respiratory distress. She has no wheezes.  Abdominal: Soft. Bowel sounds are normal.  Musculoskeletal: Normal range of motion.  Neurological: She is alert and oriented to person, place, and time. She exhibits normal muscle tone. Coordination normal.  Skin: Skin is warm and dry.  Psychiatric: She has a normal mood and affect. Her behavior is normal. Thought content normal.  Nursing note and vitals reviewed.    ED Treatments / Results  Labs (all labs ordered are listed, but only abnormal results are displayed) Labs Reviewed - No data to display  EKG  EKG Interpretation  Date/Time:  Saturday December 28 2016 09:35:06 EDT Ventricular Rate:  59 PR Interval:    QRS Duration: 90 QT Interval:  447 QTC Calculation: 443 R Axis:   42 Text Interpretation:  Normal sinus rhythm HEART RATE DECREASED SINCE last tracing 02/13/15 Confirmed by Pattricia Boss [phone]) on 12/28/2016 10:39:19 AM       Radiology Dg Chest 2 View  Result Date: 12/28/2016 CLINICAL DATA:  Shortness of breath, dizziness EXAM: CHEST  2 VIEW COMPARISON:  01/27/2014 FINDINGS: There is hyperinflation of the lungs compatible with COPD. Heart and mediastinal contours are within normal limits. No focal opacities or effusions. No acute bony abnormality. Degenerative changes in the thoracic spine and shoulders. IMPRESSION: COPD.  No active disease. Electronically Signed   By: Rolm Baptise M.D.   On: 12/28/2016 10:17    Procedures Procedures (including critical care  time)  Medications Ordered in ED Medications - No data to display   Initial Impression / Assessment and Plan / ED Course  I have reviewed the triage vital signs and the nursing notes.  Pertinent labs & imaging results that were available during my care of the patient were reviewed by me and considered in my medical decision making (see chart for details).   83 year old female with cognitive impairment who presents today stating that he is feeling dyspneic. Her neighbors to see her on a regular basis also notes that she took her medications on her own while normally receiving doses. No evidence here that patient has overdosed. Workup here is negative for any acute causes of dyspnea such as cardiac ischemia, pulmonary infiltrates, or COPD exacerbation. D-dimer obtained is elevated but within normal limits with the age adjustment. She is not tachycardic and feels improved after 1 albuterol. Lower extremities reveal  no signs of DVT and she has no history of PE. Patient is discharged in improved condition. I have had discussion with patient and her neighbors regarding return precautions and need for follow-up and they voice understanding.    Final Clinical Impressions(s) / ED Diagnoses   Final diagnoses:  Dyspnea, unspecified type    New Prescriptions New Prescriptions   No medications on file     Pattricia Boss, MD 12/28/16 1344

## 2016-12-28 NOTE — ED Triage Notes (Signed)
Pt presents for evaluation of dizziness starting this AM. Pt with hx of dementia, lives alone. Family helps manage patient medications and typically keeps them out of reach. Reports medications were correct Thursday, but on arrival this AM it appeared that patient had been messing with medications and potentially taking more or less than necessary. Pt interactive, alert and oriented to baseline.

## 2017-03-26 ENCOUNTER — Other Ambulatory Visit: Payer: Self-pay | Admitting: Neurosurgery

## 2017-03-26 DIAGNOSIS — D329 Benign neoplasm of meninges, unspecified: Secondary | ICD-10-CM

## 2017-04-09 ENCOUNTER — Ambulatory Visit
Admission: RE | Admit: 2017-04-09 | Discharge: 2017-04-09 | Disposition: A | Discharge status: Home / Self Care | Payer: Medicare Other | Source: Ambulatory Visit | Attending: Neurosurgery | Admitting: Neurosurgery

## 2017-04-09 ENCOUNTER — Other Ambulatory Visit: Payer: Medicare Other

## 2017-04-09 DIAGNOSIS — D329 Benign neoplasm of meninges, unspecified: Secondary | ICD-10-CM

## 2017-04-09 MED ORDER — GADOBENATE DIMEGLUMINE 529 MG/ML IV SOLN
6.0000 mL | Freq: Once | INTRAVENOUS | Status: AC | PRN
  Administered 2017-04-09: 6 mL via INTRAVENOUS

## 2017-06-19 ENCOUNTER — Ambulatory Visit: Payer: Medicare Other | Admitting: Neurology

## 2017-08-12 ENCOUNTER — Encounter: Payer: Self-pay | Admitting: Neurology

## 2017-08-12 ENCOUNTER — Ambulatory Visit: Payer: Medicare Other | Admitting: Neurology

## 2017-08-12 ENCOUNTER — Encounter (INDEPENDENT_AMBULATORY_CARE_PROVIDER_SITE_OTHER): Payer: Self-pay

## 2017-08-12 VITALS — BP 145/64 | HR 70 | Ht 62.0 in | Wt 136.0 lb

## 2017-08-12 DIAGNOSIS — G309 Alzheimer's disease, unspecified: Secondary | ICD-10-CM | POA: Diagnosis not present

## 2017-08-12 DIAGNOSIS — F0280 Dementia in other diseases classified elsewhere without behavioral disturbance: Secondary | ICD-10-CM

## 2017-08-12 MED ORDER — MEMANTINE HCL 10 MG PO TABS: 10.0000 mg | ORAL_TABLET | Freq: Two times a day (BID) | ORAL | 11 refills | Status: DC

## 2017-08-12 NOTE — Progress Notes (Addendum)
PATIENT: Sheila West DOB: 1929-08-21  Chief Complaint  Patient presents with  . Dementia    MMSE 19/30 - 3 animals. She is here with her longtime family friend and POA, [name].  They would like to have her dementia further evaluated.  She is currently taking donepezil 10mg  daily.  . Neurosurgery    Ashok Pall, MD - referring MD  . PCP    Lavone Orn, MD     HISTORICAL  Sheila West is a 84 years old female, seen in refer by  Ashok Pall for evaluation of dementia, his primary care is Dr. Laurann Montana, Jenny Reichmann, initial evaluation was on August 12, 2017.  He is accompanied by her power of attorney, longtime friend [name].  I have reviewed and summarized the referring note, she has a history of stable right frontal meningeal meningioma, I personally reviewed MRI of the brain in November 2018, 23 x 23 x 20 mm meningioma at right lateral frontal lobe, small amount of vasogenic edema in the surrounding white matter, no midline shift.  she currently lives alone at her house, [name] has known her for over 37 years, who check on her frequently  She is a retired Psychologist, sport and exercise, also did some daycare work, her sister suffer dementia, she was noted to have gradual onset memory loss for over few years, gradually getting worse, quick driving since summer 2018, she can attend her personal needs.  She also suffered significant shoulder pain, limited range of motion, but not a surgical candidate.  Her sister suffer dementia  REVIEW OF SYSTEMS: Full 14 system review of systems performed and notable only for fatigue, spinning sensation, shortness of breath, joint pain, memory loss, confusion, headaches, dizziness  ALLERGIES: No Known Allergies  HOME MEDICATIONS: Current Outpatient Medications  Medication Sig Dispense Refill  . Acetaminophen (TYLENOL PO) Take by mouth as needed.    . donepezil (ARICEPT) 10 MG tablet Take 10 mg by mouth at bedtime.    Marland Kitchen omeprazole (PRILOSEC) 20 MG  capsule Take 20 mg by mouth daily.    . simvastatin (ZOCOR) 20 MG tablet Take 20 mg by mouth daily at 6 PM.      No current facility-administered medications for this visit.     PAST MEDICAL HISTORY: Past Medical History:  Diagnosis Date  . Anemia   . Arthritis    "left shoulder; left hip" (02/13/2015)  . Asthma   . GERD (gastroesophageal reflux disease)   . Heart murmur dx'd 02/13/2015  . Hypercholesterolemia   . Hypertension   . Intracranial meningioma ([place])   . Melanoma ([place])    "I've had multile melanomas removed"  . Memory loss     PAST SURGICAL HISTORY: Past Surgical History:  Procedure Laterality Date  . APPENDECTOMY    . COLONOSCOPY N/A 02/15/2015   Procedure: COLONOSCOPY;  Surgeon: Manus Gunning, MD;  Location: [facility] ENDOSCOPY;  Service: Gastroenterology;  Laterality: N/A;  . ESOPHAGOGASTRODUODENOSCOPY N/A 02/14/2015   Procedure: ESOPHAGOGASTRODUODENOSCOPY (EGD);  Surgeon: Manus Gunning, MD;  Location: [place];  Service: Gastroenterology;  Laterality: N/A;  . GIVENS CAPSULE STUDY N/A 02/17/2015   Procedure: GIVENS CAPSULE STUDY;  Surgeon: Manus Gunning, MD;  Location: [place];  Service: Gastroenterology;  Laterality: N/A;  . JOINT REPLACEMENT    . REPLACEMENT TOTAL KNEE Right   . TOTAL ABDOMINAL HYSTERECTOMY      FAMILY HISTORY: History reviewed. No pertinent family history.  SOCIAL HISTORY:  Social History   Socioeconomic History  .  Marital status: Widowed    Spouse name: Not on file  . Number of children: 0  . Years of education: 59  . Highest education level: High school graduate  Social Needs  . Financial resource strain: Not on file  . Food insecurity - worry: Not on file  . Food insecurity - inability: Not on file  . Transportation needs - medical: Not on file  . Transportation needs - non-medical: Not on file  Occupational History  . Occupation: Retired  Tobacco Use  . Smoking status: Never Smoker  . Smokeless  tobacco: Never Used  Substance and Sexual Activity  . Alcohol use: No  . Drug use: No  . Sexual activity: No  Other Topics Concern  . Not on file  Social History Narrative   Lives at home alone.   Right-handed.   2-3 cups caffeine daily.   She is cared for by her close family friends and POA, [name] and [name].  They live within 2-3 miles and check on her daily.     PHYSICAL EXAM   Vitals:   08/12/17 1110  BP: (!) 145/64  Pulse: 70  Weight: 136 lb (61.7 kg)  Height: 5\' 2"  (1.575 m)    Not recorded      Body mass index is 24.87 kg/m.  PHYSICAL EXAMNIATION:  Gen: NAD, conversant, well nourised, obese, well groomed                     Cardiovascular: Regular rate rhythm, no peripheral edema, warm, nontender. Eyes: Conjunctivae clear without exudates or hemorrhage Neck: Supple, no carotid bruits. Pulmonary: Clear to auscultation bilaterally   NEUROLOGICAL EXAM:  MMSE - Mini Mental State Exam 08/12/2017  Orientation to time 1  Orientation to Place 2  Registration 3  Attention/ Calculation 4  Recall 0  Language- name 2 objects 2  Language- repeat 1  Language- follow 3 step command 3  Language- read & follow direction 1  Write a sentence 1  Copy design 1  Total score 19  Animal naming 3   CRANIAL NERVES: CN II: Visual fields are full to confrontation. Fundoscopic exam is normal with sharp discs and no vascular changes. Pupils are round equal and briskly reactive to light. CN III, IV, VI: extraocular movement are normal. No ptosis. CN V: Facial sensation is intact to pinprick in all 3 divisions bilaterally. Corneal responses are intact.  CN VII: Face is symmetric with normal eye closure and smile. CN VIII: Hearing is normal to rubbing fingers CN IX, X: Palate elevates symmetrically. Phonation is normal. CN XI: Head turning and shoulder shrug are intact CN XII: Tongue is midline with normal movements and no atrophy.  MOTOR: Left shoulder examination  his left shoulder pain  REFLEXES: Reflexes are hypoactive and symmetric at the biceps, triceps, knees, and ankles. Plantar responses are flexor.  SENSORY: Intact to light touch, pinprick, positional sensation and vibratory sensation are intact in fingers and toes.  COORDINATION: Rapid alternating movements and fine finger movements are intact. There is no dysmetria on finger-to-nose and heel-knee-shin.    GAIT/STANCE: She needs pushed up to get up from seated position, cautious, unsteady gait  DIAGNOSTIC DATA (LABS, IMAGING, TESTING) - I reviewed patient records, labs, notes, testing and imaging myself where available.   ASSESSMENT AND PLAN  BRYNLEIGH SEQUEIRA is a 84 y.o. female   Dementia  Strong family history of dementia, most consistent with central nervous system degenerative disorder.  Such as Alzheimer's  disease.  The relative small stable size of the right frontotemporal meningioma less likely account for her current complaints of progressive worse sending memory loss   Laboratory evaluation to rule out treatable etiology  She is already taking Aricept 10 mg daily  Start Namenda 10 mg twice a day    Marcial Pacas, M.D. Ph.D.  [facility] [address], [place], [place] [address] Ph: [phone] Fax: [phone]  CC: Lavone Orn, MD, Ashok Pall, MD

## 2017-08-13 LAB — TSH: TSH: 2.03 u[IU]/mL (ref 0.450–4.500)

## 2017-08-13 LAB — RPR: RPR: NONREACTIVE

## 2017-08-13 LAB — VITAMIN B12: Vitamin B-12: 497 pg/mL (ref 232–1245)

## 2017-08-14 ENCOUNTER — Telehealth: Payer: Self-pay | Admitting: *Deleted

## 2017-08-14 NOTE — Telephone Encounter (Signed)
Spoke to her POA, [name] on [employer] - he is aware of lab results.

## 2017-08-14 NOTE — Telephone Encounter (Signed)
-----   Message from Marcial Pacas, MD sent at 08/14/2017  9:19 AM EDT ----- Please call patient for normal laboratory result

## 2017-10-25 ENCOUNTER — Ambulatory Visit (HOSPITAL_COMMUNITY)
Admission: EM | Admit: 2017-10-25 | Discharge: 2017-10-25 | Disposition: A | Discharge status: Home / Self Care | Payer: Medicare Other | Attending: Family Medicine | Admitting: Family Medicine

## 2017-10-25 ENCOUNTER — Encounter (HOSPITAL_COMMUNITY): Payer: Self-pay | Admitting: *Deleted

## 2017-10-25 DIAGNOSIS — H10501 Unspecified blepharoconjunctivitis, right eye: Secondary | ICD-10-CM | POA: Diagnosis not present

## 2017-10-25 MED ORDER — TOBRAMYCIN 0.3 % OP OINT: 1.0000 "application " | TOPICAL_OINTMENT | Freq: Three times a day (TID) | OPHTHALMIC | 0 refills | Status: DC

## 2017-10-25 NOTE — ED Triage Notes (Signed)
C/O right eye irritation and pruritix x 10 days.  C/O some pain in right eye.  Family states had same problem 1 yr ago & had some left over sulfacetaminde eye gtts; has been taking them since onset of sxs, but no longer having any improvement.  Denies vision changes.

## 2017-10-25 NOTE — ED Provider Notes (Signed)
[place]   [id number] 10/25/17 Arrival Time: 1227   SUBJECTIVE:  Sheila West is a 84 y.o. female who presents to the urgent care with complaint of right eye irritation and itching x 10 days.     Family states had same problem 1 yr ago & had some left over sulfacetaminde eye gtts; has been taking them since onset of sxs, but no longer having any improvement.  Denies vision changes.  Past Medical History:  Diagnosis Date  . Anemia   . Arthritis    "left shoulder; left hip" (02/13/2015)  . Asthma   . GERD (gastroesophageal reflux disease)   . Heart murmur dx'd 02/13/2015  . Hypercholesterolemia   . Hypertension   . Intracranial meningioma ([place])   . Melanoma ([place])    "I've had multile melanomas removed"  . Memory loss    Family History  Problem Relation Age of Onset  . Cancer Sister   . Cancer Sister   . Dementia Sister    Social History   Socioeconomic History  . Marital status: Widowed    Spouse name: Not on file  . Number of children: 0  . Years of education: 55  . Highest education level: High school graduate  Occupational History  . Occupation: Retired  Scientific laboratory technician  . Financial resource strain: Not on file  . Food insecurity:    Worry: Not on file    Inability: Not on file  . Transportation needs:    Medical: Not on file    Non-medical: Not on file  Tobacco Use  . Smoking status: Never Smoker  . Smokeless tobacco: Never Used  Substance and Sexual Activity  . Alcohol use: No  . Drug use: No  . Sexual activity: Never  Lifestyle  . Physical activity:    Days per week: Not on file    Minutes per session: Not on file  . Stress: Not on file  Relationships  . Social connections:    Talks on phone: Not on file    Gets together: Not on file    Attends religious service: Not on file    Active member of club or organization: Not on file    Attends meetings of clubs or organizations: Not on file    Relationship status: Not on file  .  Intimate partner violence:    Fear of current or ex partner: Not on file    Emotionally abused: Not on file    Physically abused: Not on file    Forced sexual activity: Not on file  Other Topics Concern  . Not on file  Social History Narrative   Lives at home alone.   Right-handed.   2-3 cups caffeine daily.   She is cared for by her close family friends and POA, [name] and [name].  They live within 2-3 miles and check on her daily.   Current Meds  Medication Sig  . donepezil (ARICEPT) 10 MG tablet Take 10 mg by mouth at bedtime.  . sulfacetamide (BLEPH-10) 10 % ophthalmic solution Place 1 drop into the right eye every 3 (three) hours. Old Rx from last year; began taking w/ sxs over past 10 days   No Known Allergies    ROS: As per HPI, remainder of ROS negative.   OBJECTIVE:   Vitals:   10/25/17 1325  BP: (!) 147/84  Pulse: (!) 57  Resp: 20  Temp: 98.5 F (36.9 C)  TempSrc: Oral  SpO2: 97%  General appearance: alert; no distress Eyes: PERRL; EOMI; conjunctiva reddened on the right with mild eversion of the lower lid HENT: normocephalic; atraumatic; oral mucosa normal Neck: supple Back: no CVA tenderness Extremities: no cyanosis or edema; symmetrical with no gross deformities Skin: warm and dry Neurologic: normal gait; grossly normal Psychological: alert and cooperative; normal mood and affect      Labs:  Results for orders placed or performed in visit on 08/12/17  TSH  Result Value Ref Range   TSH 2.030 0.450 - 4.500 uIU/mL  Vitamin B12  Result Value Ref Range   Vitamin B-12 497 232 - 1,245 pg/mL  RPR  Result Value Ref Range   RPR Ser Ql Non Reactive Non Reactive    Labs Reviewed - No data to display  No results found.     ASSESSMENT & PLAN:  1. Blepharoconjunctivitis of right eye, unspecified blepharoconjunctivitis type     Meds ordered this encounter  Medications  . tobramycin (TOBREX) 0.3 % ophthalmic ointment    Sig:  Place 1 application into the right eye 3 (three) times daily.    Dispense:  3.5 g    Refill:  0    Reviewed expectations re: course of current medical issues. Questions answered. Outlined signs and symptoms indicating need for more acute intervention. Patient verbalized understanding. After Visit Summary given.    Procedures:      Robyn Haber, MD 10/25/17 1347

## 2018-02-12 ENCOUNTER — Ambulatory Visit: Payer: Medicare Other | Admitting: Nurse Practitioner

## 2018-02-17 ENCOUNTER — Other Ambulatory Visit: Payer: Self-pay | Admitting: Neurology

## 2018-02-25 NOTE — Progress Notes (Signed)
[facility]  PATIENT: Sheila West DOB: 06-06-1929   REASON FOR VISIT: Follow-up for dementia HISTORY FROM: Patient and POA [name]    HISTORY OF PRESENT ILLNESS: 08/12/17 Sheila West is a 84 years old female, seen in refer by  Ashok Pall for evaluation of dementia, his primary care is Dr. Laurann Montana, Jenny Reichmann, initial evaluation was on August 12, 2017.  He is accompanied by her power of attorney, longtime friend [name]. I have reviewed and summarized the referring note, she has a history of stable right frontal meningeal meningioma, I personally reviewed MRI of the brain in November 2018, 23 x 23 x 20 mm meningioma at right lateral frontal lobe, small amount of vasogenic edema in the surrounding white matter, no midline shift. she currently lives alone at her house, [name] has known her for over 87 years, who check on her frequently She is a retired Psychologist, sport and exercise, also did some daycare work, her sister suffer dementia, she was noted to have gradual onset memory loss for over few years, gradually getting worse, quick driving since summer 2018, she can attend her personal needs. She also suffered significant shoulder pain, limited range of motion, but not a surgical candidate. Her sister suffer dementia UPDATE 9/30/2019CM  Sheila West, 84 year old female returns for follow-up with dementia.  She is accompanied by her power of attorney.  She continues to live alone in her home and [name]  checks on her several times a day.  Her neighbors also check on her.  She needs assistance with bathing, can  dress herself.  [name] gives her her medications she no longer cooks but can heat  things up in the microwave.  She stays outside in the yard with her flowers trimming things etc.  Long-term plan is to keep her home as long as possible her sister suffers from dementia.  Namenda 10 mg twice a day was added to her Aricept at her last visit by Dr. Krista Blue however she started  having dizzy spells and her primary care asked her to cut down to just once daily she still has a rare dizzy spell.  Her water intake is limited and she was encouraged to increase her water intake.  She returns for reevaluation  REVIEW OF SYSTEMS: Full 14 system review of systems performed and notable only for those listed, all others are neg:  Constitutional: neg  Cardiovascular: neg Ear/Nose/Throat: neg  Skin: neg Eyes: neg Respiratory: neg Gastroitestinal: neg  Hematology/Lymphatic: neg  Endocrine: neg Musculoskeletal:neg Allergy/Immunology: neg Neurological: Memory loss, dizziness Psychiatric: neg Sleep : neg   ALLERGIES: No Known Allergies  HOME MEDICATIONS: Outpatient Medications Prior to Visit  Medication Sig Dispense Refill  . donepezil (ARICEPT) 10 MG tablet Take 10 mg by mouth at bedtime.    . memantine (NAMENDA) 10 MG tablet TAKE 1 TABLET BY MOUTH TWICE A DAY 180 tablet 1  . Acetaminophen (TYLENOL PO) Take by mouth as needed.    Marland Kitchen omeprazole (PRILOSEC) 20 MG capsule Take 20 mg by mouth daily.    . simvastatin (ZOCOR) 20 MG tablet Take 20 mg by mouth daily at 6 PM.     . sulfacetamide (BLEPH-10) 10 % ophthalmic solution Place 1 drop into the right eye every 3 (three) hours. Old Rx from last year; began taking w/ sxs over past 10 days    . tobramycin (TOBREX) 0.3 % ophthalmic ointment Place 1 application into the right eye 3 (three) times daily. 3.5 g 0  No facility-administered medications prior to visit.     PAST MEDICAL HISTORY: Past Medical History:  Diagnosis Date  . Anemia   . Arthritis    "left shoulder; left hip" (02/13/2015)  . Asthma   . GERD (gastroesophageal reflux disease)   . Heart murmur dx'd 02/13/2015  . Hypercholesterolemia   . Hypertension   . Intracranial meningioma ([place])   . Melanoma ([place])    "I've had multile melanomas removed"  . Memory loss     PAST SURGICAL HISTORY: Past Surgical History:  Procedure Laterality Date  .  APPENDECTOMY    . COLONOSCOPY N/A 02/15/2015   Procedure: COLONOSCOPY;  Surgeon: Manus Gunning, MD;  Location: [facility] ENDOSCOPY;  Service: Gastroenterology;  Laterality: N/A;  . ESOPHAGOGASTRODUODENOSCOPY N/A 02/14/2015   Procedure: ESOPHAGOGASTRODUODENOSCOPY (EGD);  Surgeon: Manus Gunning, MD;  Location: [place];  Service: Gastroenterology;  Laterality: N/A;  . GIVENS CAPSULE STUDY N/A 02/17/2015   Procedure: GIVENS CAPSULE STUDY;  Surgeon: Manus Gunning, MD;  Location: [place];  Service: Gastroenterology;  Laterality: N/A;  . JOINT REPLACEMENT    . REPLACEMENT TOTAL KNEE Right   . TOTAL ABDOMINAL HYSTERECTOMY      FAMILY HISTORY: Family History  Problem Relation Age of Onset  . Cancer Sister   . Cancer Sister   . Dementia Sister     SOCIAL HISTORY: Social History   Socioeconomic History  . Marital status: Widowed    Spouse name: Not on file  . Number of children: 0  . Years of education: 58  . Highest education level: High school graduate  Occupational History  . Occupation: Retired  Scientific laboratory technician  . Financial resource strain: Not on file  . Food insecurity:    Worry: Not on file    Inability: Not on file  . Transportation needs:    Medical: Not on file    Non-medical: Not on file  Tobacco Use  . Smoking status: Never Smoker  . Smokeless tobacco: Never Used  Substance and Sexual Activity  . Alcohol use: No  . Drug use: No  . Sexual activity: Never  Lifestyle  . Physical activity:    Days per week: Not on file    Minutes per session: Not on file  . Stress: Not on file  Relationships  . Social connections:    Talks on phone: Not on file    Gets together: Not on file    Attends religious service: Not on file    Active member of club or organization: Not on file    Attends meetings of clubs or organizations: Not on file    Relationship status: Not on file  . Intimate partner violence:    Fear of current or ex partner: Not on file      Emotionally abused: Not on file    Physically abused: Not on file    Forced sexual activity: Not on file  Other Topics Concern  . Not on file  Social History Narrative   Lives at home alone.   Right-handed.   2-3 cups caffeine daily.   She is cared for by her close family friends and POA, [name] and [name].  They live within 2-3 miles and check on her daily.     PHYSICAL EXAM  Vitals:   03/02/18 1439  BP: (!) 151/78  Pulse: 66  Weight: 133 lb 12.8 oz (60.7 kg)  Height: 5\' 2"  (1.575 m)   Body mass index is 24.47 kg/m.  Generalized: Well  developed, in no acute distress , well-groomed Head: normocephalic and atraumatic,. Oropharynx benign  Neck: Supple,  Musculoskeletal: No deformity   Neurological examination   Mentation: Alert  MMSE - Mini Mental State Exam 03/02/2018 08/12/2017  Orientation to time 0 1  Orientation to Place 1 2  Registration 3 3  Attention/ Calculation 0 4  Recall 0 0  Language- name 2 objects 2 2  Language- repeat 1 1  Language- follow 3 step command 2 3  Language- read & follow direction 1 1  Write a sentence 1 1  Copy design 0 1  Total score 11 19   Follows all commands speech and language fluent.   Cranial nerve II-XII: Pupils were equal round reactive to light extraocular movements were full, visual field were full on confrontational test. Facial sensation and strength were normal. hearing was intact to finger rubbing bilaterally. Uvula tongue midline. head turning and shoulder shrug were normal and symmetric.Tongue protrusion into cheek strength was normal. Motor: normal bulk and tone, full strength in the BUE, BLE, except weakness in left shoulder. Sensory: normal and symmetric to light touch,  Coordination: finger-nose-finger, heel-to-shin bilaterally, some apraxia with the use of upper and lower extremities Reflexes: Hypoactive and symmetric plantar responses were flexor bilaterally. Gait and Station: Rising up from seated  position with push off , cautious unsteady gait with single-point cane  DIAGNOSTIC DATA (LABS, IMAGING, TESTING) - I reviewed patient records, labs, notes, testing and imaging myself where available.  Lab Results  Component Value Date   WBC 5.3 12/28/2016   HGB 12.6 12/28/2016   HCT 37.8 12/28/2016   MCV 88.9 12/28/2016   PLT 233 12/28/2016      Component Value Date/Time   NA 136 12/28/2016 0945   K 3.6 12/28/2016 0945   CL 101 12/28/2016 0945   CO2 26 12/28/2016 0945   GLUCOSE 122 (H) 12/28/2016 0945   BUN 20 12/28/2016 0945   CREATININE 1.27 (H) 12/28/2016 0945   CALCIUM 8.9 12/28/2016 0945   PROT 6.6 12/28/2016 0945   ALBUMIN 3.7 12/28/2016 0945   AST 24 12/28/2016 0945   ALT 13 (L) 12/28/2016 0945   ALKPHOS 55 12/28/2016 0945   BILITOT 0.9 12/28/2016 0945   GFRNONAA 38 (L) 12/28/2016 0945   GFRAA 44 (L) 12/28/2016 0945    Lab Results  Component Value Date   OTRRNHAF79 038 08/12/2017   Lab Results  Component Value Date   TSH 2.030 08/12/2017      ASSESSMENT AND PLAN Sheila West is a 84 y.o. female  here to follow-up for dementia.  There is a strong family history of dementia most consistent with central nervous system degenerative disorder such as Alzheimer's disease.  Labs for treatable causes of dementia are returned normal    PLAN:  Continue Aricept 10 mg daily filled by primary care Encourage water intake to prevent dehydration Continue Namenda daily Continue to be active Use cane for safe ambulation Follow-up 6 to 8 months Dennie Bible, [facility], [facility], APRN  [facility] [address] [place], [place] [address] [phone]

## 2018-03-02 ENCOUNTER — Encounter: Payer: Self-pay | Admitting: Nurse Practitioner

## 2018-03-02 ENCOUNTER — Ambulatory Visit: Payer: Medicare Other | Admitting: Nurse Practitioner

## 2018-03-02 VITALS — BP 151/78 | HR 66 | Ht 62.0 in | Wt 133.8 lb

## 2018-03-02 DIAGNOSIS — F0390 Unspecified dementia without behavioral disturbance: Secondary | ICD-10-CM | POA: Diagnosis not present

## 2018-03-02 DIAGNOSIS — R413 Other amnesia: Secondary | ICD-10-CM

## 2018-03-02 NOTE — Patient Instructions (Signed)
Continue Aricept 10 mg daily filled by primary care Encourage water intake to prevent dehydration Continue Namenda daily Continue to be active Use cane for safe ambulation Follow-up 6 to 8 months

## 2018-03-02 NOTE — Progress Notes (Signed)
I have reviewed and agreed above plan. 

## 2018-06-25 ENCOUNTER — Encounter (HOSPITAL_COMMUNITY): Payer: Self-pay

## 2018-06-25 ENCOUNTER — Ambulatory Visit (HOSPITAL_COMMUNITY)
Admission: EM | Admit: 2018-06-25 | Discharge: 2018-06-25 | Disposition: A | Discharge status: Home / Self Care | Payer: Medicare HMO | Attending: Family Medicine | Admitting: Family Medicine

## 2018-06-25 ENCOUNTER — Ambulatory Visit (INDEPENDENT_AMBULATORY_CARE_PROVIDER_SITE_OTHER): Payer: Medicare HMO

## 2018-06-25 DIAGNOSIS — M25561 Pain in right knee: Secondary | ICD-10-CM | POA: Diagnosis not present

## 2018-06-25 DIAGNOSIS — M79604 Pain in right leg: Secondary | ICD-10-CM | POA: Insufficient documentation

## 2018-06-25 MED ORDER — ACETAMINOPHEN 500 MG PO TABS: 500.0000 mg | ORAL_TABLET | Freq: Four times a day (QID) | ORAL | 0 refills | Status: DC | PRN

## 2018-06-25 NOTE — Discharge Instructions (Addendum)
The x ray did not show any abnormalities I am not seeing anything concerning for blood clot in the leg today There is no signs of infection She can try tylenol for the pain to see if this helps. Most likely muscle pain or arthritis If her symptoms continue she needs to follow up with her doctor.  If she develops more pain, redness or swelling she needs to go to the ER.

## 2018-06-25 NOTE — ED Triage Notes (Signed)
Pt C/O right leg pain. Symptoms started 2 days ago. Pt sons states his mom does not recall if she had a injury to her leg. In the past she had knee surgery on her right knee.

## 2018-06-26 NOTE — ED Provider Notes (Signed)
[facility]    CSN: [id number] Arrival date & time: 06/25/18  9528     History   Chief Complaint Chief Complaint  Patient presents with  . Leg Pain    Right     HPI Sheila West is a 85 y.o. female.   Patient is a 85 year old female with past medical history of right knee replacement.  She presents with right leg pain that started approximate 2 days ago.  Her son is here with her.  Patient has a history of Alzheimer's.  She is unable to truly describe where her pain is.  She just keeps pointing to her entire right leg.  They are unsure of any injury to the leg or falls.  There is been no bruising, swelling or redness.  She has had similar pain to this in the past.  Personal and she has taken 1 Tylenol a day for her pain.  No calf swelling, calf pain.  No recent long distance traveling.  No history of blood clots.  ROS per HPI      Past Medical History:  Diagnosis Date  . Anemia   . Arthritis    "left shoulder; left hip" (02/13/2015)  . Asthma   . GERD (gastroesophageal reflux disease)   . Heart murmur dx'd 02/13/2015  . Hypercholesterolemia   . Hypertension   . Intracranial meningioma ([place])   . Melanoma ([place])    "I've had multile melanomas removed"  . Memory loss     Patient Active Problem List   Diagnosis Date Noted  . Acute diverticulitis 11/30/2016  . AKI (acute kidney injury) ([place]) 11/30/2016  . Memory loss   . Anemia due to other cause   . GERD (gastroesophageal reflux disease) 02/13/2015  . Meningioma ([place]) 02/13/2015  . Leg swelling 02/13/2015  . Dementia without behavioral disturbance ([place]) 02/13/2015  . Iron deficiency anemia due to chronic blood loss 02/13/2015  . Melena 02/13/2015  . Chest pain 01/27/2014  . Hyperlipidemia 01/27/2014  . Hypertension     Past Surgical History:  Procedure Laterality Date  . APPENDECTOMY    . COLONOSCOPY N/A 02/15/2015   Procedure: COLONOSCOPY;  Surgeon: Manus Gunning, MD;  Location: [facility]  ENDOSCOPY;  Service: Gastroenterology;  Laterality: N/A;  . ESOPHAGOGASTRODUODENOSCOPY N/A 02/14/2015   Procedure: ESOPHAGOGASTRODUODENOSCOPY (EGD);  Surgeon: Manus Gunning, MD;  Location: [place];  Service: Gastroenterology;  Laterality: N/A;  . GIVENS CAPSULE STUDY N/A 02/17/2015   Procedure: GIVENS CAPSULE STUDY;  Surgeon: Manus Gunning, MD;  Location: [place];  Service: Gastroenterology;  Laterality: N/A;  . JOINT REPLACEMENT    . REPLACEMENT TOTAL KNEE Right   . TOTAL ABDOMINAL HYSTERECTOMY      OB History   No obstetric history on file.      Home Medications    Prior to Admission medications   Medication Sig Start Date End Date Taking? Authorizing Provider  acetaminophen (TYLENOL) 500 MG tablet Take 1 tablet (500 mg total) by mouth every 6 (six) hours as needed. 06/25/18   Zulay Corrie, Tressia Miners A, NP  donepezil (ARICEPT) 10 MG tablet Take 10 mg by mouth at bedtime.    [provider]    Family History Family History  Problem Relation Age of Onset  . Cancer Sister   . Cancer Sister   . Dementia Sister     Social History Social History   Tobacco Use  . Smoking status: Never Smoker  . Smokeless tobacco: Never Used  Substance Use Topics  .  Alcohol use: No  . Drug use: No     Allergies   Patient has no known allergies.   Review of Systems Review of Systems   Physical Exam Triage Vital Signs ED Triage Vitals  Enc Vitals Group     BP 06/25/18 1042 135/65     Pulse Rate 06/25/18 1042 75     Resp 06/25/18 1042 16     Temp 06/25/18 1042 99.2 F (37.3 C)     Temp Source 06/25/18 1042 Oral     SpO2 06/25/18 1042 100 %     Weight --      Height --      Head Circumference --      [place] --      Pain Score 06/25/18 1043 6     Pain Loc --      Pain Edu? --      Excl. in [place]? --    No data found.  Updated Vital Signs BP 135/65 (BP Location: Right Arm)   Pulse 75   Temp 99.2 F (37.3 C) (Oral)   Resp 16   SpO2 100%    Visual Acuity Right Eye Distance:   Left Eye Distance:   Bilateral Distance:    Right Eye Near:   Left Eye Near:    Bilateral Near:     Physical Exam Vitals signs and nursing note reviewed.  Constitutional:      General: She is not in acute distress.    Appearance: Normal appearance. She is well-developed and normal weight. She is not ill-appearing.  HENT:     Head: Normocephalic and atraumatic.  Eyes:     Conjunctiva/sclera: Conjunctivae normal.  Neck:     Musculoskeletal: Neck supple.  Cardiovascular:     Rate and Rhythm: Normal rate and regular rhythm.     Heart sounds: No murmur.  Pulmonary:     Effort: Pulmonary effort is normal. No respiratory distress.     Breath sounds: Normal breath sounds.  Musculoskeletal:        General: No swelling, tenderness, deformity or signs of injury.     Right lower leg: No edema.     Left lower leg: No edema.     Comments: Range of motion is normal to the right leg.  There is no hip tenderness upon palpation.  Patient is walking on the leg with no issues.  She does use a cane. Scar from previous knee replacement on the right knee.  No bruising, redness or swelling around the knee. No lower extremity edema, calf swelling, erythema. No deformities.  Sensation and pedal pulse intact.  Skin:    General: Skin is warm and dry.  Neurological:     Mental Status: She is alert.      UC Treatments / Results  Labs (all labs ordered are listed, but only abnormal results are displayed) Labs Reviewed - No data to display  EKG None  Radiology Dg Knee Complete 4 Views Right  Result Date: 06/25/2018 CLINICAL DATA:  Right knee pain for several days. No known injury. EXAM: RIGHT KNEE - COMPLETE 4+ VIEW COMPARISON:  None. FINDINGS: Right total knee prosthesis in normal position and alignment. No evidence of prosthetic loosening. No effusion. IMPRESSION: No acute abnormality. Electronically Signed   By: Claudie Revering M.D.   On: 06/25/2018 12:00     Procedures Procedures (including critical care time)  Medications Ordered in UC Medications - No data to display  Initial Impression / Assessment and  Plan / UC Course  I have reviewed the triage vital signs and the nursing notes.  Pertinent labs & imaging results that were available during my care of the patient were reviewed by me and considered in my medical decision making (see chart for details).     Patient is an 85 year old female with a history of dementia.  She is not able to describe exactly where her pain is.  She does point to the right knee when asking where the pain is. Exam otherwise normal. X-ray of the knee where she had a previous knee replacement is negative for any acute abnormalities There is no signs of hip displacement, hip fracture, DVT. We will have her son give her 2 extra strength Tylenol twice a day for pain This is most likely due to some arthritis If she develops any worsening symptoms or her pain is not get better she will need to follow-up with her primary care Son is understanding of plan and agrees Final Clinical Impressions(s) / UC Diagnoses   Final diagnoses:  Pain of right lower extremity     Discharge Instructions     The x ray did not show any abnormalities I am not seeing anything concerning for blood clot in the leg today There is no signs of infection She can try tylenol for the pain to see if this helps. Most likely muscle pain or arthritis If her symptoms continue she needs to follow up with her doctor.  If she develops more pain, redness or swelling she needs to go to the ER.     ED Prescriptions    Medication Sig Dispense Auth. Provider   acetaminophen (TYLENOL) 500 MG tablet Take 1 tablet (500 mg total) by mouth every 6 (six) hours as needed. 30 tablet Orvan July, NP     Controlled Substance Prescriptions [place] Controlled Substance Registry consulted? no   Orvan July, NP 06/26/18 1451

## 2018-07-08 DIAGNOSIS — Z08 Encounter for follow-up examination after completed treatment for malignant neoplasm: Secondary | ICD-10-CM | POA: Diagnosis not present

## 2018-07-08 DIAGNOSIS — Z1283 Encounter for screening for malignant neoplasm of skin: Secondary | ICD-10-CM | POA: Diagnosis not present

## 2018-07-08 DIAGNOSIS — L570 Actinic keratosis: Secondary | ICD-10-CM | POA: Diagnosis not present

## 2018-07-08 DIAGNOSIS — X32XXXD Exposure to sunlight, subsequent encounter: Secondary | ICD-10-CM | POA: Diagnosis not present

## 2018-07-08 DIAGNOSIS — Z85820 Personal history of malignant melanoma of skin: Secondary | ICD-10-CM | POA: Diagnosis not present

## 2018-08-31 ENCOUNTER — Ambulatory Visit: Payer: Medicare Other | Admitting: Nurse Practitioner

## 2018-08-31 ENCOUNTER — Ambulatory Visit: Payer: Medicare Other | Admitting: Neurology

## 2018-09-17 ENCOUNTER — Encounter: Payer: Self-pay | Admitting: Neurology

## 2018-09-17 ENCOUNTER — Ambulatory Visit (INDEPENDENT_AMBULATORY_CARE_PROVIDER_SITE_OTHER): Payer: Medicare HMO | Admitting: Neurology

## 2018-09-17 ENCOUNTER — Other Ambulatory Visit: Payer: Self-pay

## 2018-09-17 DIAGNOSIS — D329 Benign neoplasm of meninges, unspecified: Secondary | ICD-10-CM

## 2018-09-17 DIAGNOSIS — F0390 Unspecified dementia without behavioral disturbance: Secondary | ICD-10-CM

## 2018-09-17 MED ORDER — MEMANTINE HCL 10 MG PO TABS
10.0000 mg | ORAL_TABLET | Freq: Two times a day (BID) | ORAL | 4 refills | Status: DC
Start: 2018-09-17 — End: 2019-07-17

## 2018-09-17 MED ORDER — DONEPEZIL HCL 10 MG PO TABS: 10.0000 mg | ORAL_TABLET | Freq: Every day | ORAL | 4 refills | Status: DC

## 2018-09-17 NOTE — Progress Notes (Signed)
[facility]  PATIENT: Sheila West DOB: [birth year]    HISTORY OF PRESENT ILLNESS: 08/12/17 Sheila West is a 85 years old female, seen in refer by  Sheila West for evaluation of dementia, his primary care is Dr. Laurann West, Sheila West, initial evaluation was on August 12, 2017.  He is accompanied by her power of attorney, longtime friend Sheila West.  I have reviewed and summarized the referring note, she has a history of stable right frontal meningeal meningioma, I personally reviewed MRI of the brain in November 2018, 23 x 23 x 20 mm meningioma at right lateral frontal lobe, small amount of vasogenic edema in the surrounding white matter, no midline shift. 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 She also suffered significant shoulder pain, limited range of motion, but not a surgical candidate.  UPDATE 9/30/2019CM  Sheila West, 85 year old female returns for follow-up with dementia.  She is accompanied by her power of attorney.  She continues to live alone in her home and Sheila West  checks on her several times a day.  Her neighbors also check on her.  She needs assistance with bathing, can  dress herself.  Sheila West gives her her medications she no longer cooks but can heat  things up in the microwave.  She stays outside in the yard with her flowers trimming things etc.  Long-term plan is to keep her home as long as possible her sister suffers from dementia.  Namenda 10 mg twice a day was added to her Aricept at her last visit by Sheila West however she started having dizzy spells and her primary care asked her to cut down to just once daily she still has a rare dizzy spell.  Her  water intake is limited and she was encouraged to increase her water intake.  She returns for reevaluation  Virtual Visit via Video  I connected with TIFFIANY West on 09/17/18 at  by video and verified that I am speaking with the correct person using two identifiers.   I discussed the limitations, risks, security and privacy concerns of performing an evaluation and management service by video and the availability of in person appointments. I also discussed with the patient that there may be a patient responsible charge related to this service. The patient expressed understanding and agreed to proceed.  History of Present Illness: Her power of attorney Sheila West check on her regularly. She still lives at home, ambulate with a cane, has good appetite, sleeps well most of the time, increased confusion, has difficulty understanding current situations, complains of frequent dizziness, is taking Aricept 10 mg daily, Namenda 10 mg twice a day   Observations/Objective: I have reviewed problem lists, medications, allergies. Follow simple commands, hard of hearing, does not know her age, or the name of Sheila West  Assessment and Plan: Dementia  Progressive worsening  Keep Aricept 10 mg daily,  Namenda 10 mg twice a day  Follow Up Instructions:     I discussed the assessment and treatment plan with the patient. The patient was provided an opportunity to ask questions and all were answered. The patient agreed with the plan and demonstrated an understanding of the  instructions.   The patient was advised to call back or seek an in-person evaluation if the symptoms worsen or if the condition fails to improve as anticipated.  I provided 25 minutes of non-face-to-face time during this encounter.   Marcial Pacas, MD

## 2018-10-12 DIAGNOSIS — F0390 Unspecified dementia without behavioral disturbance: Secondary | ICD-10-CM | POA: Diagnosis not present

## 2018-10-14 ENCOUNTER — Telehealth: Payer: Self-pay | Admitting: Neurology

## 2018-10-14 NOTE — Telephone Encounter (Signed)
Patient will followup as needed

## 2018-10-14 NOTE — Telephone Encounter (Signed)
-----   Message from Marcial Pacas, MD sent at 09/17/2018 11:31 AM EDT ----- prn

## 2018-10-20 DIAGNOSIS — S0081XA Abrasion of other part of head, initial encounter: Secondary | ICD-10-CM | POA: Diagnosis not present

## 2018-10-21 ENCOUNTER — Emergency Department: Payer: Medicare HMO

## 2018-10-21 ENCOUNTER — Emergency Department
Admission: EM | Admit: 2018-10-21 | Discharge: 2018-10-21 | Disposition: A | Discharge status: Home / Self Care | Payer: Medicare HMO | Attending: Emergency Medicine | Admitting: Emergency Medicine

## 2018-10-21 ENCOUNTER — Encounter: Payer: Self-pay | Admitting: *Deleted

## 2018-10-21 ENCOUNTER — Other Ambulatory Visit: Payer: Self-pay

## 2018-10-21 DIAGNOSIS — M25512 Pain in left shoulder: Secondary | ICD-10-CM | POA: Diagnosis not present

## 2018-10-21 DIAGNOSIS — Y92129 Unspecified place in nursing home as the place of occurrence of the external cause: Secondary | ICD-10-CM | POA: Insufficient documentation

## 2018-10-21 DIAGNOSIS — W19XXXA Unspecified fall, initial encounter: Secondary | ICD-10-CM | POA: Insufficient documentation

## 2018-10-21 DIAGNOSIS — S0031XA Abrasion of nose, initial encounter: Secondary | ICD-10-CM | POA: Diagnosis not present

## 2018-10-21 DIAGNOSIS — Z79899 Other long term (current) drug therapy: Secondary | ICD-10-CM | POA: Insufficient documentation

## 2018-10-21 DIAGNOSIS — R279 Unspecified lack of coordination: Secondary | ICD-10-CM | POA: Diagnosis not present

## 2018-10-21 DIAGNOSIS — S51811A Laceration without foreign body of right forearm, initial encounter: Secondary | ICD-10-CM | POA: Diagnosis not present

## 2018-10-21 DIAGNOSIS — J45909 Unspecified asthma, uncomplicated: Secondary | ICD-10-CM | POA: Insufficient documentation

## 2018-10-21 DIAGNOSIS — Z96659 Presence of unspecified artificial knee joint: Secondary | ICD-10-CM | POA: Insufficient documentation

## 2018-10-21 DIAGNOSIS — Y939 Activity, unspecified: Secondary | ICD-10-CM | POA: Diagnosis not present

## 2018-10-21 DIAGNOSIS — F0390 Unspecified dementia without behavioral disturbance: Secondary | ICD-10-CM | POA: Diagnosis not present

## 2018-10-21 DIAGNOSIS — S0081XA Abrasion of other part of head, initial encounter: Secondary | ICD-10-CM | POA: Diagnosis not present

## 2018-10-21 DIAGNOSIS — R404 Transient alteration of awareness: Secondary | ICD-10-CM | POA: Diagnosis not present

## 2018-10-21 DIAGNOSIS — S4992XA Unspecified injury of left shoulder and upper arm, initial encounter: Secondary | ICD-10-CM | POA: Diagnosis not present

## 2018-10-21 DIAGNOSIS — Y999 Unspecified external cause status: Secondary | ICD-10-CM | POA: Diagnosis not present

## 2018-10-21 DIAGNOSIS — M8949 Other hypertrophic osteoarthropathy, multiple sites: Secondary | ICD-10-CM | POA: Diagnosis not present

## 2018-10-21 DIAGNOSIS — K219 Gastro-esophageal reflux disease without esophagitis: Secondary | ICD-10-CM | POA: Diagnosis not present

## 2018-10-21 DIAGNOSIS — I509 Heart failure, unspecified: Secondary | ICD-10-CM | POA: Diagnosis not present

## 2018-10-21 DIAGNOSIS — I10 Essential (primary) hypertension: Secondary | ICD-10-CM | POA: Diagnosis not present

## 2018-10-21 DIAGNOSIS — Z743 Need for continuous supervision: Secondary | ICD-10-CM | POA: Diagnosis not present

## 2018-10-21 DIAGNOSIS — S0993XA Unspecified injury of face, initial encounter: Secondary | ICD-10-CM | POA: Diagnosis present

## 2018-10-21 DIAGNOSIS — G309 Alzheimer's disease, unspecified: Secondary | ICD-10-CM | POA: Diagnosis not present

## 2018-10-21 NOTE — ED Provider Notes (Signed)
[facility] Emergency Department Provider Note    First MD Initiated Contact with Patient 10/21/18 0018     (approximate)  I have reviewed the triage vital signs and the nursing notes.   HISTORY  Chief Complaint Fall    HPI Sheila West is a 85 y.o. female with history of dementia presents to the emergency department from [place] via EMS secondary to witnessed fall with a subcentimeter abrasion to the nasal bridge.  Per EMS staff states that the patient was combative.  EMS however states that the patient has been pleasant and noncombative during their entire encounter.  Patient presents to the emergency department much the same very pleasant and non-combative. patient denies any complaints        Past Medical History:  Diagnosis Date  . Anemia   . Arthritis    "left shoulder; left hip" (02/13/2015)  . Asthma   . GERD (gastroesophageal reflux disease)   . Heart murmur dx'd 02/13/2015  . Hypercholesterolemia   . Hypertension   . Intracranial meningioma ([place])   . Melanoma ([place])    "I've had multile melanomas removed"  . Memory loss     Patient Active Problem List   Diagnosis Date Noted  . Acute diverticulitis 11/30/2016  . AKI (acute kidney injury) ([place]) 11/30/2016  . Memory loss   . Anemia due to other cause   . GERD (gastroesophageal reflux disease) 02/13/2015  . Meningioma ([place]) 02/13/2015  . Leg swelling 02/13/2015  . Dementia without behavioral disturbance ([place]) 02/13/2015  . Iron deficiency anemia due to chronic blood loss 02/13/2015  . Melena 02/13/2015  . Chest pain 01/27/2014  . Hyperlipidemia 01/27/2014  . Hypertension     Past Surgical History:  Procedure Laterality Date  . APPENDECTOMY    . COLONOSCOPY N/A 02/15/2015   Procedure: COLONOSCOPY;  Surgeon: Manus Gunning, MD;  Location: [facility] ENDOSCOPY;  Service: Gastroenterology;  Laterality: N/A;  . ESOPHAGOGASTRODUODENOSCOPY N/A 02/14/2015   Procedure:  ESOPHAGOGASTRODUODENOSCOPY (EGD);  Surgeon: Manus Gunning, MD;  Location: [place];  Service: Gastroenterology;  Laterality: N/A;  . GIVENS CAPSULE STUDY N/A 02/17/2015   Procedure: GIVENS CAPSULE STUDY;  Surgeon: Manus Gunning, MD;  Location: [place];  Service: Gastroenterology;  Laterality: N/A;  . JOINT REPLACEMENT    . REPLACEMENT TOTAL KNEE Right   . TOTAL ABDOMINAL HYSTERECTOMY      Prior to Admission medications   Medication Sig Start Date End Date Taking? Authorizing Provider  acetaminophen (TYLENOL) 500 MG tablet Take 1 tablet (500 mg total) by mouth every 6 (six) hours as needed. 06/25/18   Loura Halt A, NP  donepezil (ARICEPT) 10 MG tablet Take 1 tablet (10 mg total) by mouth at bedtime. 09/17/18   Marcial Pacas, MD  memantine (NAMENDA) 10 MG tablet Take 1 tablet (10 mg total) by mouth 2 (two) times daily. 09/17/18   Marcial Pacas, MD    Allergies Patient has no known allergies.  Family History  Problem Relation Age of Onset  . Cancer Sister   . Cancer Sister   . Dementia Sister     Social History Social History   Tobacco Use  . Smoking status: Never Smoker  . Smokeless tobacco: Never Used  Substance Use Topics  . Alcohol use: No  . Drug use: No    Review of Systems Constitutional: No fever/chills Eyes: No visual changes. ENT: No sore throat. Cardiovascular: Denies chest pain. Respiratory: Denies shortness of breath. Gastrointestinal: No abdominal pain.  No nausea, no vomiting.  No diarrhea.  No constipation. Genitourinary: Negative for dysuria. Musculoskeletal: Negative for neck pain.  Negative for back pain. Integumentary: Negative for rash. Neurological: Negative for headaches, focal weakness or numbness.  ____________________________________________   PHYSICAL EXAM:  VITAL SIGNS: ED Triage Vitals  Enc Vitals Group     BP 10/21/18 0016 (!) 155/78     Pulse Rate 10/21/18 0016 88     Resp 10/21/18 0016 14     Temp 10/21/18 0016  98.3 F (36.8 C)     Temp Source 10/21/18 0016 Oral     SpO2 10/21/18 0016 99 %     Weight 10/21/18 0017 63.5 kg (140 lb)     Height 10/21/18 0017 1.6 m (5\' 3" )     Head Circumference --      Peak Flow --      Pain Score 10/21/18 0016 0     Pain Loc --      Pain Edu? --      Excl. in [place]? --     Constitutional: Alert and oriented. Well appearing and in no acute distress. Eyes: Conjunctivae are normal. PERRL. EOMI. Head: Subcentimeter abrasion to the nasal bridge Ears:  Healthy appearing ear canals and TMs bilaterally Nose: No congestion/rhinnorhea.  Subcentimeter abrasion to the nasal bridge Mouth/Throat: Mucous membranes are moist.  Oropharynx non-erythematous. Neck: No stridor.  No cervical spine tenderness to palpation. Cardiovascular: Normal rate, regular rhythm. Good peripheral circulation. Grossly normal heart sounds. Respiratory: Normal respiratory effort.  No retractions. No audible wheezing. Gastrointestinal: Soft and nontender. No distention.  Musculoskeletal: Grinding sensation felt with movement of the left shoulder no apparent discomfort to the patient Neurologic:  Normal speech and language. No gross focal neurologic deficits are appreciated.  Skin: Subcentimeter abrasion to the nasal bridge no rash noted.    ___________________________________  Procedures   ____________________________________________   INITIAL IMPRESSION / MDM / ASSESSMENT AND PLAN / ED COURSE  As part of my medical decision making, I reviewed the following data within the [place] was evaluated in Emergency Department on 10/21/2018 for the symptoms described in the history of present illness. She was evaluated in the context of the global COVID-19 pandemic, which necessitated consideration that the patient might be at risk for infection with the SARS-CoV-2 virus that causes COVID-19. Institutional protocols and algorithms that pertain to the evaluation  of patients at risk for COVID-19 are in a state of rapid change based on information released by regulatory bodies including the CDC and federal and state organizations. These policies and algorithms were followed during the patient's care in the ED.  Some ED evaluations and interventions may be delayed as a result of limited staffing during the pandemic.*    ____________________________________________  FINAL CLINICAL IMPRESSION(S) / ED DIAGNOSES  Final diagnoses:  Fall, initial encounter  Abrasion  Facial abrasion, initial encounter     MEDICATIONS GIVEN DURING THIS VISIT:  Medications - No data to display   ED Discharge Orders    None       Note:  This document was prepared using Dragon voice recognition software and may include unintentional dictation errors.   Gregor Hams, MD 10/21/18 [phone]

## 2018-10-21 NOTE — ED Triage Notes (Signed)
Per EMS pt had a witnessed fall at [facility]

## 2018-10-21 NOTE — ED Notes (Addendum)
EMS waiting for xray results. Xray done and EMS to transport back to [facility]

## 2018-10-25 DIAGNOSIS — I509 Heart failure, unspecified: Secondary | ICD-10-CM | POA: Diagnosis not present

## 2018-10-28 DIAGNOSIS — G309 Alzheimer's disease, unspecified: Secondary | ICD-10-CM | POA: Diagnosis not present

## 2018-10-28 DIAGNOSIS — I10 Essential (primary) hypertension: Secondary | ICD-10-CM | POA: Diagnosis not present

## 2018-10-28 DIAGNOSIS — M8949 Other hypertrophic osteoarthropathy, multiple sites: Secondary | ICD-10-CM | POA: Diagnosis not present

## 2018-10-29 DIAGNOSIS — Z79899 Other long term (current) drug therapy: Secondary | ICD-10-CM | POA: Diagnosis not present

## 2018-11-03 DIAGNOSIS — F0391 Unspecified dementia with behavioral disturbance: Secondary | ICD-10-CM | POA: Diagnosis not present

## 2018-11-04 DIAGNOSIS — G309 Alzheimer's disease, unspecified: Secondary | ICD-10-CM | POA: Diagnosis not present

## 2018-11-04 DIAGNOSIS — M8949 Other hypertrophic osteoarthropathy, multiple sites: Secondary | ICD-10-CM | POA: Diagnosis not present

## 2018-11-04 DIAGNOSIS — I10 Essential (primary) hypertension: Secondary | ICD-10-CM | POA: Diagnosis not present

## 2018-11-06 DIAGNOSIS — I509 Heart failure, unspecified: Secondary | ICD-10-CM | POA: Diagnosis not present

## 2018-11-12 DIAGNOSIS — M1711 Unilateral primary osteoarthritis, right knee: Secondary | ICD-10-CM | POA: Diagnosis not present

## 2018-11-12 DIAGNOSIS — I10 Essential (primary) hypertension: Secondary | ICD-10-CM | POA: Diagnosis not present

## 2018-11-12 DIAGNOSIS — G309 Alzheimer's disease, unspecified: Secondary | ICD-10-CM | POA: Diagnosis not present

## 2018-11-12 DIAGNOSIS — M25561 Pain in right knee: Secondary | ICD-10-CM | POA: Diagnosis not present

## 2018-11-12 DIAGNOSIS — R269 Unspecified abnormalities of gait and mobility: Secondary | ICD-10-CM | POA: Diagnosis not present

## 2018-11-12 DIAGNOSIS — F0281 Dementia in other diseases classified elsewhere with behavioral disturbance: Secondary | ICD-10-CM | POA: Diagnosis not present

## 2018-11-12 DIAGNOSIS — M8949 Other hypertrophic osteoarthropathy, multiple sites: Secondary | ICD-10-CM | POA: Diagnosis not present

## 2018-11-13 DIAGNOSIS — M25561 Pain in right knee: Secondary | ICD-10-CM | POA: Diagnosis not present

## 2018-11-13 DIAGNOSIS — Z96651 Presence of right artificial knee joint: Secondary | ICD-10-CM | POA: Diagnosis not present

## 2018-11-15 DIAGNOSIS — I10 Essential (primary) hypertension: Secondary | ICD-10-CM | POA: Diagnosis not present

## 2018-11-16 DIAGNOSIS — G309 Alzheimer's disease, unspecified: Secondary | ICD-10-CM | POA: Diagnosis not present

## 2018-11-16 DIAGNOSIS — M8949 Other hypertrophic osteoarthropathy, multiple sites: Secondary | ICD-10-CM | POA: Diagnosis not present

## 2018-11-16 DIAGNOSIS — I10 Essential (primary) hypertension: Secondary | ICD-10-CM | POA: Diagnosis not present

## 2018-12-10 DIAGNOSIS — H52223 Regular astigmatism, bilateral: Secondary | ICD-10-CM | POA: Diagnosis not present

## 2018-12-15 DIAGNOSIS — F0391 Unspecified dementia with behavioral disturbance: Secondary | ICD-10-CM | POA: Diagnosis not present

## 2019-01-19 DIAGNOSIS — F0391 Unspecified dementia with behavioral disturbance: Secondary | ICD-10-CM | POA: Diagnosis not present

## 2019-03-02 DIAGNOSIS — F0391 Unspecified dementia with behavioral disturbance: Secondary | ICD-10-CM | POA: Diagnosis not present

## 2019-03-08 DIAGNOSIS — I10 Essential (primary) hypertension: Secondary | ICD-10-CM | POA: Diagnosis not present

## 2019-03-08 DIAGNOSIS — Z79899 Other long term (current) drug therapy: Secondary | ICD-10-CM | POA: Diagnosis not present

## 2019-03-08 DIAGNOSIS — F419 Anxiety disorder, unspecified: Secondary | ICD-10-CM | POA: Diagnosis not present

## 2019-03-08 DIAGNOSIS — F0391 Unspecified dementia with behavioral disturbance: Secondary | ICD-10-CM | POA: Diagnosis not present

## 2019-03-08 DIAGNOSIS — K219 Gastro-esophageal reflux disease without esophagitis: Secondary | ICD-10-CM | POA: Diagnosis not present

## 2019-03-08 DIAGNOSIS — M8949 Other hypertrophic osteoarthropathy, multiple sites: Secondary | ICD-10-CM | POA: Diagnosis not present

## 2019-03-18 DIAGNOSIS — I509 Heart failure, unspecified: Secondary | ICD-10-CM | POA: Diagnosis not present

## 2019-03-18 DIAGNOSIS — Z79899 Other long term (current) drug therapy: Secondary | ICD-10-CM | POA: Diagnosis not present

## 2019-03-24 DIAGNOSIS — M8949 Other hypertrophic osteoarthropathy, multiple sites: Secondary | ICD-10-CM | POA: Diagnosis not present

## 2019-03-24 DIAGNOSIS — I10 Essential (primary) hypertension: Secondary | ICD-10-CM | POA: Diagnosis not present

## 2019-03-24 DIAGNOSIS — G309 Alzheimer's disease, unspecified: Secondary | ICD-10-CM | POA: Diagnosis not present

## 2019-03-24 DIAGNOSIS — D508 Other iron deficiency anemias: Secondary | ICD-10-CM | POA: Diagnosis not present

## 2019-03-24 DIAGNOSIS — E441 Mild protein-calorie malnutrition: Secondary | ICD-10-CM | POA: Diagnosis not present

## 2019-04-06 DIAGNOSIS — F0391 Unspecified dementia with behavioral disturbance: Secondary | ICD-10-CM | POA: Diagnosis not present

## 2019-04-21 DIAGNOSIS — G309 Alzheimer's disease, unspecified: Secondary | ICD-10-CM | POA: Diagnosis not present

## 2019-04-21 DIAGNOSIS — I10 Essential (primary) hypertension: Secondary | ICD-10-CM | POA: Diagnosis not present

## 2019-04-21 DIAGNOSIS — M8949 Other hypertrophic osteoarthropathy, multiple sites: Secondary | ICD-10-CM | POA: Diagnosis not present

## 2019-05-10 DIAGNOSIS — S0091XA Abrasion of unspecified part of head, initial encounter: Secondary | ICD-10-CM | POA: Diagnosis not present

## 2019-05-10 DIAGNOSIS — M8949 Other hypertrophic osteoarthropathy, multiple sites: Secondary | ICD-10-CM | POA: Diagnosis not present

## 2019-05-10 DIAGNOSIS — I10 Essential (primary) hypertension: Secondary | ICD-10-CM | POA: Diagnosis not present

## 2019-05-10 DIAGNOSIS — F0391 Unspecified dementia with behavioral disturbance: Secondary | ICD-10-CM | POA: Diagnosis not present

## 2019-05-10 DIAGNOSIS — R238 Other skin changes: Secondary | ICD-10-CM | POA: Diagnosis not present

## 2019-05-11 DIAGNOSIS — F0391 Unspecified dementia with behavioral disturbance: Secondary | ICD-10-CM | POA: Diagnosis not present

## 2019-06-07 DIAGNOSIS — I10 Essential (primary) hypertension: Secondary | ICD-10-CM | POA: Diagnosis not present

## 2019-06-07 DIAGNOSIS — F0391 Unspecified dementia with behavioral disturbance: Secondary | ICD-10-CM | POA: Diagnosis not present

## 2019-06-07 DIAGNOSIS — R269 Unspecified abnormalities of gait and mobility: Secondary | ICD-10-CM | POA: Diagnosis not present

## 2019-06-07 DIAGNOSIS — M8949 Other hypertrophic osteoarthropathy, multiple sites: Secondary | ICD-10-CM | POA: Diagnosis not present

## 2019-06-10 DIAGNOSIS — R451 Restlessness and agitation: Secondary | ICD-10-CM | POA: Diagnosis not present

## 2019-06-10 DIAGNOSIS — G4700 Insomnia, unspecified: Secondary | ICD-10-CM | POA: Diagnosis not present

## 2019-06-10 DIAGNOSIS — F0391 Unspecified dementia with behavioral disturbance: Secondary | ICD-10-CM | POA: Diagnosis not present

## 2019-06-24 DIAGNOSIS — I739 Peripheral vascular disease, unspecified: Secondary | ICD-10-CM | POA: Diagnosis not present

## 2019-06-24 DIAGNOSIS — B351 Tinea unguium: Secondary | ICD-10-CM | POA: Diagnosis not present

## 2019-06-24 DIAGNOSIS — L84 Corns and callosities: Secondary | ICD-10-CM | POA: Diagnosis not present

## 2019-06-24 DIAGNOSIS — I509 Heart failure, unspecified: Secondary | ICD-10-CM | POA: Diagnosis not present

## 2019-06-28 DIAGNOSIS — D500 Iron deficiency anemia secondary to blood loss (chronic): Secondary | ICD-10-CM | POA: Diagnosis not present

## 2019-06-28 DIAGNOSIS — F0390 Unspecified dementia without behavioral disturbance: Secondary | ICD-10-CM | POA: Diagnosis not present

## 2019-06-28 DIAGNOSIS — M4800 Spinal stenosis, site unspecified: Secondary | ICD-10-CM | POA: Diagnosis not present

## 2019-06-28 DIAGNOSIS — M8580 Other specified disorders of bone density and structure, unspecified site: Secondary | ICD-10-CM | POA: Diagnosis not present

## 2019-06-28 DIAGNOSIS — K5792 Diverticulitis of intestine, part unspecified, without perforation or abscess without bleeding: Secondary | ICD-10-CM | POA: Diagnosis not present

## 2019-06-28 DIAGNOSIS — I129 Hypertensive chronic kidney disease with stage 1 through stage 4 chronic kidney disease, or unspecified chronic kidney disease: Secondary | ICD-10-CM | POA: Diagnosis not present

## 2019-06-28 DIAGNOSIS — D329 Benign neoplasm of meninges, unspecified: Secondary | ICD-10-CM | POA: Diagnosis not present

## 2019-06-28 DIAGNOSIS — N189 Chronic kidney disease, unspecified: Secondary | ICD-10-CM | POA: Diagnosis not present

## 2019-06-28 DIAGNOSIS — E785 Hyperlipidemia, unspecified: Secondary | ICD-10-CM | POA: Diagnosis not present

## 2019-06-29 DIAGNOSIS — D329 Benign neoplasm of meninges, unspecified: Secondary | ICD-10-CM | POA: Diagnosis not present

## 2019-06-29 DIAGNOSIS — F0390 Unspecified dementia without behavioral disturbance: Secondary | ICD-10-CM | POA: Diagnosis not present

## 2019-06-29 DIAGNOSIS — M8580 Other specified disorders of bone density and structure, unspecified site: Secondary | ICD-10-CM | POA: Diagnosis not present

## 2019-06-29 DIAGNOSIS — D500 Iron deficiency anemia secondary to blood loss (chronic): Secondary | ICD-10-CM | POA: Diagnosis not present

## 2019-06-29 DIAGNOSIS — N189 Chronic kidney disease, unspecified: Secondary | ICD-10-CM | POA: Diagnosis not present

## 2019-06-29 DIAGNOSIS — E785 Hyperlipidemia, unspecified: Secondary | ICD-10-CM | POA: Diagnosis not present

## 2019-06-29 DIAGNOSIS — K5792 Diverticulitis of intestine, part unspecified, without perforation or abscess without bleeding: Secondary | ICD-10-CM | POA: Diagnosis not present

## 2019-06-29 DIAGNOSIS — I129 Hypertensive chronic kidney disease with stage 1 through stage 4 chronic kidney disease, or unspecified chronic kidney disease: Secondary | ICD-10-CM | POA: Diagnosis not present

## 2019-06-29 DIAGNOSIS — M4800 Spinal stenosis, site unspecified: Secondary | ICD-10-CM | POA: Diagnosis not present

## 2019-07-02 ENCOUNTER — Emergency Department
Admission: EM | Admit: 2019-07-02 | Discharge: 2019-07-02 | Disposition: A | Discharge status: Home / Self Care | Payer: Medicare HMO | Attending: Emergency Medicine | Admitting: Emergency Medicine

## 2019-07-02 ENCOUNTER — Other Ambulatory Visit: Payer: Self-pay

## 2019-07-02 ENCOUNTER — Emergency Department: Payer: Medicare HMO

## 2019-07-02 ENCOUNTER — Encounter: Payer: Self-pay | Admitting: Emergency Medicine

## 2019-07-02 DIAGNOSIS — W19XXXA Unspecified fall, initial encounter: Secondary | ICD-10-CM | POA: Diagnosis not present

## 2019-07-02 DIAGNOSIS — S299XXA Unspecified injury of thorax, initial encounter: Secondary | ICD-10-CM | POA: Diagnosis not present

## 2019-07-02 DIAGNOSIS — I10 Essential (primary) hypertension: Secondary | ICD-10-CM | POA: Diagnosis not present

## 2019-07-02 DIAGNOSIS — S199XXA Unspecified injury of neck, initial encounter: Secondary | ICD-10-CM | POA: Diagnosis not present

## 2019-07-02 DIAGNOSIS — Z79899 Other long term (current) drug therapy: Secondary | ICD-10-CM | POA: Diagnosis not present

## 2019-07-02 DIAGNOSIS — R102 Pelvic and perineal pain: Secondary | ICD-10-CM | POA: Diagnosis present

## 2019-07-02 DIAGNOSIS — R5381 Other malaise: Secondary | ICD-10-CM | POA: Diagnosis not present

## 2019-07-02 DIAGNOSIS — R112 Nausea with vomiting, unspecified: Secondary | ICD-10-CM | POA: Diagnosis not present

## 2019-07-02 DIAGNOSIS — R9389 Abnormal findings on diagnostic imaging of other specified body structures: Secondary | ICD-10-CM

## 2019-07-02 DIAGNOSIS — F0390 Unspecified dementia without behavioral disturbance: Secondary | ICD-10-CM | POA: Diagnosis not present

## 2019-07-02 DIAGNOSIS — Z96651 Presence of right artificial knee joint: Secondary | ICD-10-CM | POA: Diagnosis not present

## 2019-07-02 DIAGNOSIS — R1110 Vomiting, unspecified: Secondary | ICD-10-CM | POA: Diagnosis not present

## 2019-07-02 DIAGNOSIS — N3000 Acute cystitis without hematuria: Secondary | ICD-10-CM | POA: Insufficient documentation

## 2019-07-02 DIAGNOSIS — J45909 Unspecified asthma, uncomplicated: Secondary | ICD-10-CM | POA: Diagnosis not present

## 2019-07-02 DIAGNOSIS — T1490XA Injury, unspecified, initial encounter: Secondary | ICD-10-CM | POA: Diagnosis not present

## 2019-07-02 DIAGNOSIS — S0990XA Unspecified injury of head, initial encounter: Secondary | ICD-10-CM | POA: Diagnosis not present

## 2019-07-02 DIAGNOSIS — R279 Unspecified lack of coordination: Secondary | ICD-10-CM | POA: Diagnosis not present

## 2019-07-02 DIAGNOSIS — Z743 Need for continuous supervision: Secondary | ICD-10-CM | POA: Diagnosis not present

## 2019-07-02 LAB — CBC WITH DIFFERENTIAL/PLATELET
Abs Immature Granulocytes: 0.03 10*3/uL (ref 0.00–0.07)
Basophils Absolute: 0 10*3/uL (ref 0.0–0.1)
Basophils Relative: 1 %
Eosinophils Absolute: 0.1 10*3/uL (ref 0.0–0.5)
Eosinophils Relative: 2 %
HCT: 38.6 % (ref 36.0–46.0)
Hemoglobin: 12.5 g/dL (ref 12.0–15.0)
Immature Granulocytes: 0 %
Lymphocytes Relative: 11 %
Lymphs Abs: 0.8 10*3/uL (ref 0.7–4.0)
MCH: 29.5 pg (ref 26.0–34.0)
MCHC: 32.4 g/dL (ref 30.0–36.0)
MCV: 91 fL (ref 80.0–100.0)
Monocytes Absolute: 0.6 10*3/uL (ref 0.1–1.0)
Monocytes Relative: 8 %
Neutro Abs: 5.6 10*3/uL (ref 1.7–7.7)
Neutrophils Relative %: 78 %
Platelets: 222 10*3/uL (ref 150–400)
RBC: 4.24 MIL/uL (ref 3.87–5.11)
RDW: 12.8 % (ref 11.5–15.5)
WBC: 7.2 10*3/uL (ref 4.0–10.5)
nRBC: 0 % (ref 0.0–0.2)

## 2019-07-02 LAB — COMPREHENSIVE METABOLIC PANEL
ALT: 11 U/L (ref 0–44)
AST: 15 U/L (ref 15–41)
Albumin: 3.6 g/dL (ref 3.5–5.0)
Alkaline Phosphatase: 76 U/L (ref 38–126)
Anion gap: 8 (ref 5–15)
BUN: 30 mg/dL — ABNORMAL HIGH (ref 8–23)
CO2: 27 mmol/L (ref 22–32)
Calcium: 9 mg/dL (ref 8.9–10.3)
Chloride: 105 mmol/L (ref 98–111)
Creatinine, Ser: 1.09 mg/dL — ABNORMAL HIGH (ref 0.44–1.00)
GFR calc Af Amer: 53 mL/min — ABNORMAL LOW (ref >60)
GFR calc non Af Amer: 46 mL/min — ABNORMAL LOW (ref >60)
Glucose, Bld: 114 mg/dL — ABNORMAL HIGH (ref 70–99)
Potassium: 4.2 mmol/L (ref 3.5–5.1)
Sodium: 140 mmol/L (ref 135–145)
Total Bilirubin: 0.6 mg/dL (ref 0.3–1.2)
Total Protein: 7.2 g/dL (ref 6.5–8.1)

## 2019-07-02 LAB — URINALYSIS, COMPLETE (UACMP) WITH MICROSCOPIC
Bilirubin Urine: NEGATIVE
Glucose, UA: NEGATIVE mg/dL
Hgb urine dipstick: NEGATIVE
Ketones, ur: NEGATIVE mg/dL
Nitrite: NEGATIVE
Protein, ur: NEGATIVE mg/dL
Specific Gravity, Urine: 1.014 (ref 1.005–1.030)
WBC, UA: >50 WBC/hpf — ABNORMAL HIGH (ref 0–5)
pH: 6 (ref 5.0–8.0)

## 2019-07-02 LAB — TROPONIN I (HIGH SENSITIVITY): Troponin I (High Sensitivity): 6 ng/L (ref <18)

## 2019-07-02 LAB — LIPASE, BLOOD: Lipase: 37 U/L (ref 11–51)

## 2019-07-02 MED ORDER — CEPHALEXIN 500 MG PO CAPS: 500.0000 mg | ORAL_CAPSULE | Freq: Three times a day (TID) | ORAL | 0 refills | Status: DC

## 2019-07-02 MED ORDER — SODIUM CHLORIDE 0.9 % IV BOLUS
1000.0000 mL | Freq: Once | INTRAVENOUS | Status: AC
  Administered 2019-07-02: 17:00:00 1000 mL via INTRAVENOUS

## 2019-07-02 MED ORDER — SODIUM CHLORIDE 0.9 % IV SOLN
2.0000 g | Freq: Once | INTRAVENOUS | Status: AC
  Administered 2019-07-02: 2 g via INTRAVENOUS
  Filled 2019-07-02: qty 20

## 2019-07-02 MED ORDER — CEPHALEXIN 500 MG PO CAPS: 500.0000 mg | ORAL_CAPSULE | Freq: Three times a day (TID) | ORAL | 0 refills | Status: AC

## 2019-07-02 MED ORDER — IOHEXOL 300 MG/ML  SOLN
75.0000 mL | Freq: Once | INTRAMUSCULAR | Status: AC | PRN
  Administered 2019-07-02: 75 mL via INTRAVENOUS

## 2019-07-02 NOTE — Discharge Instructions (Addendum)
Ms. Austell imaging and labs showed +UTI, for which we have prescribed Keflex. Take the full course.  Otherwise, CT imaging did show two small nodules in her parotid gland (on the side of the face, a salivary gland). These are likely benign and given comorbidities, would not require intervention. However, please mention this to your PCP at the next follow-up.

## 2019-07-02 NOTE — ED Notes (Signed)
Pt resting in bed, pt awoke when administering fluids and antibiotics. Also pt stirs when bed gets adjusted.

## 2019-07-02 NOTE — ED Triage Notes (Signed)
Pt arrives via [facility] fololowing an unwitnessed fall. Pt was found by staff on floor. Staff stated. Pt had red marks on back. Staff reported pt was c/o chest pain and nausea. VS stable for EMS during transport.

## 2019-07-02 NOTE — ED Notes (Signed)
[facility]  CALLED  FOR  TRANSPORT  BACK TO  [facility]  HOUSE 

## 2019-07-02 NOTE — ED Notes (Signed)
Called pt's son and provided update on his mother. Informed son that pt was up for discharge and asked if he could provide transportation and he said he could not due to contact restrictions.

## 2019-07-02 NOTE — ED Notes (Signed)
Pt transported to CT ?

## 2019-07-02 NOTE — ED Provider Notes (Signed)
[facility] Emergency Department Provider Note  ____________________________________________   First MD Initiated Contact with Patient 07/02/19 1402     (approximate)  I have reviewed the triage vital signs and the nursing notes.   HISTORY  Chief Complaint Fall    HPI Sheila West is a 87 y.o. female  With PMHx below here with fall. Pt unable to provide hx due to dementia. Per staff, pt was found down after an unwitnessed fall today. She complained of nausea and Cp to staff at that time. She had some mild abrasions/redness to her back at that time as well. No other complaints. Unable to provide history on my assessment.  Level 5 caveat invoked as remainder of history, ROS, and physical exam limited due to patient's dementia.    Past Medical History:  Diagnosis Date  . Anemia   . Arthritis    "left shoulder; left hip" (02/13/2015)  . Asthma   . GERD (gastroesophageal reflux disease)   . Heart murmur dx'd 02/13/2015  . Hypercholesterolemia   . Hypertension   . Intracranial meningioma ([place])   . Melanoma ([place])    "I've had multile melanomas removed"  . Memory loss     Patient Active Problem List   Diagnosis Date Noted  . Acute diverticulitis 11/30/2016  . AKI (acute kidney injury) ([place]) 11/30/2016  . Memory loss   . Anemia due to other cause   . GERD (gastroesophageal reflux disease) 02/13/2015  . Meningioma ([place]) 02/13/2015  . Leg swelling 02/13/2015  . Dementia without behavioral disturbance ([place]) 02/13/2015  . Iron deficiency anemia due to chronic blood loss 02/13/2015  . Melena 02/13/2015  . Chest pain 01/27/2014  . Hyperlipidemia 01/27/2014  . Hypertension     Past Surgical History:  Procedure Laterality Date  . APPENDECTOMY    . COLONOSCOPY N/A 02/15/2015   Procedure: COLONOSCOPY;  Surgeon: Manus Gunning, MD;  Location: [facility] ENDOSCOPY;  Service: Gastroenterology;  Laterality: N/A;  . ESOPHAGOGASTRODUODENOSCOPY N/A  02/14/2015   Procedure: ESOPHAGOGASTRODUODENOSCOPY (EGD);  Surgeon: Manus Gunning, MD;  Location: [place];  Service: Gastroenterology;  Laterality: N/A;  . GIVENS CAPSULE STUDY N/A 02/17/2015   Procedure: GIVENS CAPSULE STUDY;  Surgeon: Manus Gunning, MD;  Location: [place];  Service: Gastroenterology;  Laterality: N/A;  . JOINT REPLACEMENT    . REPLACEMENT TOTAL KNEE Right   . TOTAL ABDOMINAL HYSTERECTOMY      Prior to Admission medications   Medication Sig Start Date End Date Taking? Authorizing Provider  acetaminophen (TYLENOL) 500 MG tablet Take 1 tablet (500 mg total) by mouth every 6 (six) hours as needed. 06/25/18   Loura Halt A, NP  cephALEXin (KEFLEX) 500 MG capsule Take 1 capsule (500 mg total) by mouth 3 (three) times daily for 7 days. 07/02/19 07/09/19  Duffy Bruce, MD  donepezil (ARICEPT) 10 MG tablet Take 1 tablet (10 mg total) by mouth at bedtime. 09/17/18   Marcial Pacas, MD  memantine (NAMENDA) 10 MG tablet Take 1 tablet (10 mg total) by mouth 2 (two) times daily. 09/17/18   Marcial Pacas, MD    Allergies Patient has no known allergies.  Family History  Problem Relation Age of Onset  . Cancer Sister   . Cancer Sister   . Dementia Sister     Social History Social History   Tobacco Use  . Smoking status: Never Smoker  . Smokeless tobacco: Never Used  Substance Use Topics  . Alcohol use: No  . Drug  use: No    Review of Systems  Review of Systems  Unable to perform ROS: Dementia     ____________________________________________  PHYSICAL EXAM:      VITAL SIGNS: ED Triage Vitals [07/02/19 1409]  Enc Vitals Group     BP (!) 174/63     Pulse Rate 81     Resp 17     Temp (!) 96.1 F (35.6 C)     Temp Source Axillary     SpO2 98 %     Weight      Height      Head Circumference      Peak Flow      Pain Score      Pain Loc      Pain Edu?      Excl. in [place]?      Physical Exam Vitals and nursing note reviewed.  Constitutional:       General: She is not in acute distress.    Appearance: She is well-developed.  HENT:     Head: Normocephalic and atraumatic.     Comments: No apparent head or neck trauma. Midline non-tender. Eyes:     Conjunctiva/sclera: Conjunctivae normal.  Cardiovascular:     Rate and Rhythm: Normal rate and regular rhythm.     Heart sounds: Normal heart sounds.  Pulmonary:     Effort: Pulmonary effort is normal. No respiratory distress.     Breath sounds: No wheezing.  Chest:     Comments: Mild TTP over anterior chest wall. No bruising or deformity. Normal WOB. Abdominal:     General: There is no distension.     Tenderness: There is abdominal tenderness in the right lower quadrant, suprapubic area and left lower quadrant.     Comments: Mild diffuse lower abd TTP.  Musculoskeletal:     Cervical back: Neck supple.  Skin:    General: Skin is warm.     Capillary Refill: Capillary refill takes less than 2 seconds.     Findings: No rash.  Neurological:     Mental Status: She is alert. She is disoriented.     Motor: No abnormal muscle tone.       ____________________________________________   LABS (all labs ordered are listed, but only abnormal results are displayed)  Labs Reviewed  COMPREHENSIVE METABOLIC PANEL - Abnormal; Notable for the following components:      Result Value   Glucose, Bld 114 (*)    BUN 30 (*)    Creatinine, Ser 1.09 (*)    GFR calc non Af Amer 46 (*)    GFR calc Af Amer 53 (*)    All other components within normal limits  URINALYSIS, COMPLETE (UACMP) WITH MICROSCOPIC - Abnormal; Notable for the following components:   Color, Urine YELLOW (*)    APPearance CLOUDY (*)    Leukocytes,Ua MODERATE (*)    WBC, UA >50 (*)    Bacteria, UA RARE (*)    All other components within normal limits  CBC WITH DIFFERENTIAL/PLATELET  LIPASE, BLOOD  TROPONIN I (HIGH SENSITIVITY)    ____________________________________________  EKG:  None ________________________________________  RADIOLOGY All imaging, including plain films, CT scans, and ultrasounds, independently reviewed by me, and interpretations confirmed via formal radiology reads.  ED MD interpretation:   CT A/P: No acute findings CT Head: NAICA CT C-Spine: Two small left parotid gland masses, likely benign - needs f/u, advanced degen disease CXR: Clear, no PNA  Official radiology report(s): CT Head Wo Contrast  Result Date: 07/02/2019 CLINICAL DATA:  86 year old female found down. Unwitnessed fall. EXAM: CT HEAD WITHOUT CONTRAST TECHNIQUE: Contiguous axial images were obtained from the base of the skull through the vertex without intravenous contrast. COMPARISON:  Head CT 02/13/2015. Brain MRI 04/09/2017. FINDINGS: Brain: Chronic right anterior operculum meningioma measuring up to 24 millimeters diameter is stable since 2016. Chronic mild regional mass effect and trace operculum edema, stable. No midline shift or ventriculomegaly. Basilar cisterns remain normal. Chronic dystrophic calcifications at the basal ganglia. Patchy bilateral white matter hypodensity is stable. Mild dystrophic calcifications in the deep cerebellar nuclei. No acute intracranial hemorrhage identified. No cortically based acute infarct identified. Vascular: Calcified atherosclerosis at the skull base. No suspicious intracranial vascular hyperdensity. Skull: Stable osteopenia and hyperostosis. No acute osseous abnormality identified. Sinuses/Orbits: Visualized paranasal sinuses and mastoids are stable and well pneumatized. Other: No acute orbit or scalp soft tissue findings. IMPRESSION: 1. No acute intracranial abnormality or acute traumatic injury identified. 2. Chronic right anterior operculum meningioma is stable since 2016. Electronically Signed   By: Genevie Ann M.D.   On: 07/02/2019 15:10   CT Cervical Spine Wo Contrast  Result Date: 07/02/2019 CLINICAL DATA:  86 year old female found down.  Unwitnessed fall. EXAM: CT CERVICAL SPINE WITHOUT CONTRAST TECHNIQUE: Multidetector CT imaging of the cervical spine was performed without intravenous contrast. Multiplanar CT image reconstructions were also generated. COMPARISON:  Brain MRI 04/09/2017. FINDINGS: Alignment: Mild degenerative appearing anterolisthesis of C7 on T1, associated chronic endplate and facet degeneration. Relatively preserved cervical lordosis otherwise. Skull base and vertebrae: Osteopenia. Visualized skull base is intact. No atlanto-occipital dissociation. No acute osseous abnormality identified. Soft tissues and spinal canal: No prevertebral fluid or swelling. No visible canal hematoma. Bulky right side calcified cervical carotid atherosclerosis. There are at least 2 left parotid gland soft tissue nodules which were not apparent on the 2018 head exam, measuring 15-17 millimeters diameter as seen on series 3, images 12 and 16. No regional lymphadenopathy. The left stylomastoid foramen seems to remain normal. Otherwise negative visible noncontrast neck soft tissues. Disc levels: Widespread advanced cervical disc, endplate, and facet degeneration with multilevel mild degenerative cervical spinal stenosis suspected. Upper chest: Negative visible lung apices. Visible upper thoracic levels appear intact. Other: Negative visible posterior fossa. IMPRESSION: 1.  No acute traumatic injury identified in the cervical spine. 2. Partially visible two small left parotid gland soft tissue masses measuring 16-18 mm diameter. Such parotid lesions are most commonly benign, although these were not apparent on a 2018 head MRI. Recommend ENT referral and follow-up CT Neck (IV contrast preferred). 3. Advanced cervical spine degeneration with multilevel mild spinal stenosis. 4. Bulky right carotid calcified atherosclerosis. Electronically Signed   By: Genevie Ann M.D.   On: 07/02/2019 15:17   CT ABDOMEN PELVIS W CONTRAST  Result Date: 07/02/2019 CLINICAL DATA:   Abdominal pain and nausea and vomiting. Recent fall. Suspected diverticulitis. EXAM: CT ABDOMEN AND PELVIS WITH CONTRAST TECHNIQUE: Multidetector CT imaging of the abdomen and pelvis was performed using the standard protocol following bolus administration of intravenous contrast. CONTRAST:  15mL OMNIPAQUE IOHEXOL 300 MG/ML  SOLN COMPARISON:  11/30/2016 FINDINGS: Lower Chest: No acute findings. Hepatobiliary: No hepatic masses identified. Stable tiny sub-cm cyst in right hepatic lobe. Pancreas:  No mass or inflammatory changes. Spleen: Within normal limits in size and appearance. Adrenals/Urinary Tract: No masses identified. No evidence of hydronephrosis. Stomach/Bowel: No evidence of obstruction, inflammatory process or abnormal fluid collections. Diverticulosis is seen mainly involving the sigmoid colon, however there  is no evidence of diverticulitis. Vascular/Lymphatic: No pathologically enlarged lymph nodes. No abdominal aortic aneurysm. Aortic atherosclerosis incidentally noted. Reproductive: Prior hysterectomy noted. Adnexal regions are unremarkable in appearance. Other:  No evidence of internal organ injury or hemoperitoneum. Musculoskeletal: No suspicious bone lesions identified. No fractures identified. IMPRESSION: Colonic diverticulosis. No radiographic evidence of diverticulitis or other acute findings. Electronically Signed   By: Marlaine Hind M.D.   On: 07/02/2019 17:08   DG Chest Portable 1 View  Result Date: 07/02/2019 CLINICAL DATA:  Patient status post fall. EXAM: PORTABLE CHEST 1 VIEW COMPARISON:  PA and lateral chest 12/28/2016. FINDINGS: Small calcified granuloma left upper lobe noted. Lungs otherwise clear. Heart size normal. Atherosclerosis is seen. No acute bony abnormality. Severe degenerative change of both shoulders noted. IMPRESSION: No acute disease. Electronically Signed   By: Inge Rise M.D.   On: 07/02/2019 15:19     ____________________________________________  PROCEDURES   Procedure(s) performed (including Critical Care):  Procedures  ____________________________________________  INITIAL IMPRESSION / MDM / [place] / ED COURSE  As part of my medical decision making, I reviewed the following data within the [place] notes reviewed and incorporated, Old chart reviewed, Notes from prior ED visits, and [place] Controlled Substance Database       *Sheila West was evaluated in Emergency Department on 07/02/2019 for the symptoms described in the history of present illness. She was evaluated in the context of the global COVID-19 pandemic, which necessitated consideration that the patient might be at risk for infection with the SARS-CoV-2 virus that causes COVID-19. Institutional protocols and algorithms that pertain to the evaluation of patients at risk for COVID-19 are in a state of rapid change based on information released by regulatory bodies including the CDC and federal and state organizations. These policies and algorithms were followed during the patient's care in the ED.  Some ED evaluations and interventions may be delayed as a result of limited staffing during the pandemic.*     Medical Decision Making:  86 yo F here with fall, reported transient nausea. Imaging show no traumatic abnormality. Labs show likely mild dehydration based on BUN:Cr ratio, +UTI which could explain her mild lower abd TTP on exam. Will treat with fluids, rocephin and d/c on Keflex.  ____________________________________________  FINAL CLINICAL IMPRESSION(S) / ED DIAGNOSES  Final diagnoses:  Fall, initial encounter  Acute cystitis without hematuria  Abnormal CT scan     MEDICATIONS GIVEN DURING THIS VISIT:  Medications  cefTRIAXone (ROCEPHIN) 2 g in sodium chloride 0.9 % 100 mL IVPB (0 g Intravenous Stopped 07/02/19 1729)  sodium chloride 0.9 % bolus 1,000 mL (1,000 mLs  Intravenous New Bag/Given 07/02/19 1651)  iohexol (OMNIPAQUE) 300 MG/ML solution 75 mL (75 mLs Intravenous Contrast Given 07/02/19 1625)     ED Discharge Orders         Ordered    cephALEXin (KEFLEX) 500 MG capsule  3 times daily     07/02/19 1736           Note:  This document was prepared using Dragon voice recognition software and may include unintentional dictation errors.   Duffy Bruce, MD 07/02/19 1740

## 2019-07-06 DIAGNOSIS — M8949 Other hypertrophic osteoarthropathy, multiple sites: Secondary | ICD-10-CM | POA: Diagnosis not present

## 2019-07-06 DIAGNOSIS — G309 Alzheimer's disease, unspecified: Secondary | ICD-10-CM | POA: Diagnosis not present

## 2019-07-06 DIAGNOSIS — I10 Essential (primary) hypertension: Secondary | ICD-10-CM | POA: Diagnosis not present

## 2019-07-06 DIAGNOSIS — R269 Unspecified abnormalities of gait and mobility: Secondary | ICD-10-CM | POA: Diagnosis not present

## 2019-07-07 DIAGNOSIS — D329 Benign neoplasm of meninges, unspecified: Secondary | ICD-10-CM | POA: Diagnosis not present

## 2019-07-07 DIAGNOSIS — D500 Iron deficiency anemia secondary to blood loss (chronic): Secondary | ICD-10-CM | POA: Diagnosis not present

## 2019-07-07 DIAGNOSIS — I129 Hypertensive chronic kidney disease with stage 1 through stage 4 chronic kidney disease, or unspecified chronic kidney disease: Secondary | ICD-10-CM | POA: Diagnosis not present

## 2019-07-07 DIAGNOSIS — K5792 Diverticulitis of intestine, part unspecified, without perforation or abscess without bleeding: Secondary | ICD-10-CM | POA: Diagnosis not present

## 2019-07-07 DIAGNOSIS — F0390 Unspecified dementia without behavioral disturbance: Secondary | ICD-10-CM | POA: Diagnosis not present

## 2019-07-07 DIAGNOSIS — N189 Chronic kidney disease, unspecified: Secondary | ICD-10-CM | POA: Diagnosis not present

## 2019-07-07 DIAGNOSIS — E785 Hyperlipidemia, unspecified: Secondary | ICD-10-CM | POA: Diagnosis not present

## 2019-07-07 DIAGNOSIS — M4800 Spinal stenosis, site unspecified: Secondary | ICD-10-CM | POA: Diagnosis not present

## 2019-07-07 DIAGNOSIS — Z20828 Contact with and (suspected) exposure to other viral communicable diseases: Secondary | ICD-10-CM | POA: Diagnosis not present

## 2019-07-07 DIAGNOSIS — M8580 Other specified disorders of bone density and structure, unspecified site: Secondary | ICD-10-CM | POA: Diagnosis not present

## 2019-07-08 DIAGNOSIS — F329 Major depressive disorder, single episode, unspecified: Secondary | ICD-10-CM | POA: Diagnosis not present

## 2019-07-08 DIAGNOSIS — F0391 Unspecified dementia with behavioral disturbance: Secondary | ICD-10-CM | POA: Diagnosis not present

## 2019-07-08 DIAGNOSIS — R451 Restlessness and agitation: Secondary | ICD-10-CM | POA: Diagnosis not present

## 2019-07-09 DIAGNOSIS — F0390 Unspecified dementia without behavioral disturbance: Secondary | ICD-10-CM | POA: Diagnosis not present

## 2019-07-09 DIAGNOSIS — Z79899 Other long term (current) drug therapy: Secondary | ICD-10-CM | POA: Diagnosis not present

## 2019-07-09 DIAGNOSIS — I129 Hypertensive chronic kidney disease with stage 1 through stage 4 chronic kidney disease, or unspecified chronic kidney disease: Secondary | ICD-10-CM | POA: Diagnosis not present

## 2019-07-09 DIAGNOSIS — M4800 Spinal stenosis, site unspecified: Secondary | ICD-10-CM | POA: Diagnosis not present

## 2019-07-09 DIAGNOSIS — Z20828 Contact with and (suspected) exposure to other viral communicable diseases: Secondary | ICD-10-CM | POA: Diagnosis not present

## 2019-07-09 DIAGNOSIS — N189 Chronic kidney disease, unspecified: Secondary | ICD-10-CM | POA: Diagnosis not present

## 2019-07-09 DIAGNOSIS — M8580 Other specified disorders of bone density and structure, unspecified site: Secondary | ICD-10-CM | POA: Diagnosis not present

## 2019-07-09 DIAGNOSIS — D500 Iron deficiency anemia secondary to blood loss (chronic): Secondary | ICD-10-CM | POA: Diagnosis not present

## 2019-07-09 DIAGNOSIS — K5792 Diverticulitis of intestine, part unspecified, without perforation or abscess without bleeding: Secondary | ICD-10-CM | POA: Diagnosis not present

## 2019-07-09 DIAGNOSIS — R269 Unspecified abnormalities of gait and mobility: Secondary | ICD-10-CM | POA: Diagnosis not present

## 2019-07-09 DIAGNOSIS — I10 Essential (primary) hypertension: Secondary | ICD-10-CM | POA: Diagnosis not present

## 2019-07-09 DIAGNOSIS — E785 Hyperlipidemia, unspecified: Secondary | ICD-10-CM | POA: Diagnosis not present

## 2019-07-09 DIAGNOSIS — I509 Heart failure, unspecified: Secondary | ICD-10-CM | POA: Diagnosis not present

## 2019-07-09 DIAGNOSIS — D329 Benign neoplasm of meninges, unspecified: Secondary | ICD-10-CM | POA: Diagnosis not present

## 2019-07-12 DIAGNOSIS — K5792 Diverticulitis of intestine, part unspecified, without perforation or abscess without bleeding: Secondary | ICD-10-CM | POA: Diagnosis not present

## 2019-07-12 DIAGNOSIS — M8580 Other specified disorders of bone density and structure, unspecified site: Secondary | ICD-10-CM | POA: Diagnosis not present

## 2019-07-12 DIAGNOSIS — N189 Chronic kidney disease, unspecified: Secondary | ICD-10-CM | POA: Diagnosis not present

## 2019-07-12 DIAGNOSIS — D500 Iron deficiency anemia secondary to blood loss (chronic): Secondary | ICD-10-CM | POA: Diagnosis not present

## 2019-07-12 DIAGNOSIS — M4800 Spinal stenosis, site unspecified: Secondary | ICD-10-CM | POA: Diagnosis not present

## 2019-07-12 DIAGNOSIS — E785 Hyperlipidemia, unspecified: Secondary | ICD-10-CM | POA: Diagnosis not present

## 2019-07-12 DIAGNOSIS — F0390 Unspecified dementia without behavioral disturbance: Secondary | ICD-10-CM | POA: Diagnosis not present

## 2019-07-12 DIAGNOSIS — D329 Benign neoplasm of meninges, unspecified: Secondary | ICD-10-CM | POA: Diagnosis not present

## 2019-07-12 DIAGNOSIS — I129 Hypertensive chronic kidney disease with stage 1 through stage 4 chronic kidney disease, or unspecified chronic kidney disease: Secondary | ICD-10-CM | POA: Diagnosis not present

## 2019-07-13 DIAGNOSIS — Z20828 Contact with and (suspected) exposure to other viral communicable diseases: Secondary | ICD-10-CM | POA: Diagnosis not present

## 2019-07-16 DIAGNOSIS — M8580 Other specified disorders of bone density and structure, unspecified site: Secondary | ICD-10-CM | POA: Diagnosis not present

## 2019-07-16 DIAGNOSIS — D329 Benign neoplasm of meninges, unspecified: Secondary | ICD-10-CM | POA: Diagnosis not present

## 2019-07-16 DIAGNOSIS — N189 Chronic kidney disease, unspecified: Secondary | ICD-10-CM | POA: Diagnosis not present

## 2019-07-16 DIAGNOSIS — F0390 Unspecified dementia without behavioral disturbance: Secondary | ICD-10-CM | POA: Diagnosis not present

## 2019-07-16 DIAGNOSIS — M4800 Spinal stenosis, site unspecified: Secondary | ICD-10-CM | POA: Diagnosis not present

## 2019-07-16 DIAGNOSIS — K5792 Diverticulitis of intestine, part unspecified, without perforation or abscess without bleeding: Secondary | ICD-10-CM | POA: Diagnosis not present

## 2019-07-16 DIAGNOSIS — E785 Hyperlipidemia, unspecified: Secondary | ICD-10-CM | POA: Diagnosis not present

## 2019-07-16 DIAGNOSIS — I129 Hypertensive chronic kidney disease with stage 1 through stage 4 chronic kidney disease, or unspecified chronic kidney disease: Secondary | ICD-10-CM | POA: Diagnosis not present

## 2019-07-16 DIAGNOSIS — D500 Iron deficiency anemia secondary to blood loss (chronic): Secondary | ICD-10-CM | POA: Diagnosis not present

## 2019-07-17 ENCOUNTER — Other Ambulatory Visit: Payer: Self-pay

## 2019-07-17 ENCOUNTER — Observation Stay
Admission: EM | Admit: 2019-07-17 | Discharge: 2019-07-18 | Payer: Medicare HMO | Attending: Internal Medicine | Admitting: Internal Medicine

## 2019-07-17 ENCOUNTER — Emergency Department: Payer: Medicare HMO

## 2019-07-17 ENCOUNTER — Encounter: Payer: Self-pay | Admitting: Emergency Medicine

## 2019-07-17 DIAGNOSIS — Z85820 Personal history of malignant melanoma of skin: Secondary | ICD-10-CM | POA: Insufficient documentation

## 2019-07-17 DIAGNOSIS — F03918 Unspecified dementia, unspecified severity, with other behavioral disturbance: Secondary | ICD-10-CM | POA: Diagnosis present

## 2019-07-17 DIAGNOSIS — I129 Hypertensive chronic kidney disease with stage 1 through stage 4 chronic kidney disease, or unspecified chronic kidney disease: Secondary | ICD-10-CM | POA: Insufficient documentation

## 2019-07-17 DIAGNOSIS — R519 Headache, unspecified: Secondary | ICD-10-CM | POA: Diagnosis not present

## 2019-07-17 DIAGNOSIS — G9341 Metabolic encephalopathy: Principal | ICD-10-CM | POA: Diagnosis present

## 2019-07-17 DIAGNOSIS — F0390 Unspecified dementia without behavioral disturbance: Secondary | ICD-10-CM | POA: Diagnosis not present

## 2019-07-17 DIAGNOSIS — K219 Gastro-esophageal reflux disease without esophagitis: Secondary | ICD-10-CM | POA: Insufficient documentation

## 2019-07-17 DIAGNOSIS — N1831 Chronic kidney disease, stage 3a: Secondary | ICD-10-CM | POA: Insufficient documentation

## 2019-07-17 DIAGNOSIS — K118 Other diseases of salivary glands: Secondary | ICD-10-CM | POA: Insufficient documentation

## 2019-07-17 DIAGNOSIS — N390 Urinary tract infection, site not specified: Secondary | ICD-10-CM | POA: Diagnosis not present

## 2019-07-17 DIAGNOSIS — R918 Other nonspecific abnormal finding of lung field: Secondary | ICD-10-CM | POA: Diagnosis not present

## 2019-07-17 DIAGNOSIS — E860 Dehydration: Secondary | ICD-10-CM | POA: Insufficient documentation

## 2019-07-17 DIAGNOSIS — Z66 Do not resuscitate: Secondary | ICD-10-CM | POA: Diagnosis not present

## 2019-07-17 DIAGNOSIS — Z79899 Other long term (current) drug therapy: Secondary | ICD-10-CM | POA: Diagnosis not present

## 2019-07-17 DIAGNOSIS — J45909 Unspecified asthma, uncomplicated: Secondary | ICD-10-CM | POA: Diagnosis not present

## 2019-07-17 DIAGNOSIS — N183 Chronic kidney disease, stage 3 unspecified: Secondary | ICD-10-CM | POA: Diagnosis present

## 2019-07-17 DIAGNOSIS — S3993XA Unspecified injury of pelvis, initial encounter: Secondary | ICD-10-CM | POA: Diagnosis not present

## 2019-07-17 DIAGNOSIS — J4520 Mild intermittent asthma, uncomplicated: Secondary | ICD-10-CM | POA: Diagnosis not present

## 2019-07-17 DIAGNOSIS — R4182 Altered mental status, unspecified: Secondary | ICD-10-CM

## 2019-07-17 DIAGNOSIS — D649 Anemia, unspecified: Secondary | ICD-10-CM | POA: Insufficient documentation

## 2019-07-17 DIAGNOSIS — Z20822 Contact with and (suspected) exposure to covid-19: Secondary | ICD-10-CM | POA: Insufficient documentation

## 2019-07-17 DIAGNOSIS — I10 Essential (primary) hypertension: Secondary | ICD-10-CM | POA: Diagnosis present

## 2019-07-17 DIAGNOSIS — F0391 Unspecified dementia with behavioral disturbance: Secondary | ICD-10-CM | POA: Diagnosis present

## 2019-07-17 DIAGNOSIS — R531 Weakness: Secondary | ICD-10-CM | POA: Diagnosis not present

## 2019-07-17 DIAGNOSIS — N3000 Acute cystitis without hematuria: Secondary | ICD-10-CM | POA: Diagnosis not present

## 2019-07-17 LAB — COMPREHENSIVE METABOLIC PANEL
ALT: 10 U/L (ref 0–44)
AST: 18 U/L (ref 15–41)
Albumin: 3.6 g/dL (ref 3.5–5.0)
Alkaline Phosphatase: 77 U/L (ref 38–126)
Anion gap: 9 (ref 5–15)
BUN: 28 mg/dL — ABNORMAL HIGH (ref 8–23)
CO2: 23 mmol/L (ref 22–32)
Calcium: 8.6 mg/dL — ABNORMAL LOW (ref 8.9–10.3)
Chloride: 104 mmol/L (ref 98–111)
Creatinine, Ser: 1.12 mg/dL — ABNORMAL HIGH (ref 0.44–1.00)
GFR calc Af Amer: 51 mL/min — ABNORMAL LOW (ref >60)
GFR calc non Af Amer: 44 mL/min — ABNORMAL LOW (ref >60)
Glucose, Bld: 159 mg/dL — ABNORMAL HIGH (ref 70–99)
Potassium: 3.7 mmol/L (ref 3.5–5.1)
Sodium: 136 mmol/L (ref 135–145)
Total Bilirubin: 0.6 mg/dL (ref 0.3–1.2)
Total Protein: 7.1 g/dL (ref 6.5–8.1)

## 2019-07-17 LAB — TSH: TSH: 3.343 u[IU]/mL (ref 0.350–4.500)

## 2019-07-17 LAB — CBC WITH DIFFERENTIAL/PLATELET
Abs Immature Granulocytes: 0.03 10*3/uL (ref 0.00–0.07)
Basophils Absolute: 0 10*3/uL (ref 0.0–0.1)
Basophils Relative: 0 %
Eosinophils Absolute: 0 10*3/uL (ref 0.0–0.5)
Eosinophils Relative: 0 %
HCT: 36 % (ref 36.0–46.0)
Hemoglobin: 11.9 g/dL — ABNORMAL LOW (ref 12.0–15.0)
Immature Granulocytes: 0 %
Lymphocytes Relative: 3 %
Lymphs Abs: 0.2 10*3/uL — ABNORMAL LOW (ref 0.7–4.0)
MCH: 30.2 pg (ref 26.0–34.0)
MCHC: 33.1 g/dL (ref 30.0–36.0)
MCV: 91.4 fL (ref 80.0–100.0)
Monocytes Absolute: 0.5 10*3/uL (ref 0.1–1.0)
Monocytes Relative: 7 %
Neutro Abs: 6.7 10*3/uL (ref 1.7–7.7)
Neutrophils Relative %: 90 %
Platelets: 231 10*3/uL (ref 150–400)
RBC: 3.94 MIL/uL (ref 3.87–5.11)
RDW: 12.8 % (ref 11.5–15.5)
WBC: 7.5 10*3/uL (ref 4.0–10.5)
nRBC: 0 % (ref 0.0–0.2)

## 2019-07-17 LAB — URINALYSIS, ROUTINE W REFLEX MICROSCOPIC
Bilirubin Urine: NEGATIVE
Glucose, UA: NEGATIVE mg/dL
Hgb urine dipstick: NEGATIVE
Ketones, ur: NEGATIVE mg/dL
Leukocytes,Ua: NEGATIVE
Nitrite: NEGATIVE
Protein, ur: 30 mg/dL — AB
Specific Gravity, Urine: 1.017 (ref 1.005–1.030)
Squamous Epithelial / HPF: NONE SEEN (ref 0–5)
pH: 6 (ref 5.0–8.0)

## 2019-07-17 LAB — RESPIRATORY PANEL BY RT PCR (FLU A&B, COVID)
Influenza A by PCR: NEGATIVE
Influenza B by PCR: NEGATIVE
SARS Coronavirus 2 by RT PCR: NEGATIVE

## 2019-07-17 LAB — GLUCOSE, CAPILLARY: Glucose-Capillary: 166 mg/dL — ABNORMAL HIGH (ref 70–99)

## 2019-07-17 LAB — MAGNESIUM: Magnesium: 1.9 mg/dL (ref 1.7–2.4)

## 2019-07-17 LAB — PROCALCITONIN: Procalcitonin: <0.1 ng/mL

## 2019-07-17 LAB — T4, FREE: Free T4: 0.84 ng/dL (ref 0.61–1.12)

## 2019-07-17 LAB — TROPONIN I (HIGH SENSITIVITY): Troponin I (High Sensitivity): 11 ng/L (ref <18)

## 2019-07-17 MED ORDER — ACETAMINOPHEN 325 MG PO TABS: 650.0000 mg | ORAL_TABLET | Freq: Four times a day (QID) | ORAL | Status: DC | PRN

## 2019-07-17 MED ORDER — ALUM & MAG HYDROXIDE-SIMETH 200-200-20 MG/5ML PO SUSP: 30.0000 mL | Freq: Four times a day (QID) | ORAL | Status: DC | PRN

## 2019-07-17 MED ORDER — DONEPEZIL HCL 5 MG PO TABS: 10.0000 mg | ORAL_TABLET | Freq: Every day | ORAL | Status: DC

## 2019-07-17 MED ORDER — DICLOFENAC SODIUM 1 % EX GEL
2.0000 g | Freq: Two times a day (BID) | CUTANEOUS | Status: DC
  Filled 2019-07-17: qty 100

## 2019-07-17 MED ORDER — ENOXAPARIN SODIUM 30 MG/0.3ML ~~LOC~~ SOLN
30.0000 mg | SUBCUTANEOUS | Status: DC
  Administered 2019-07-17: 30 mg via SUBCUTANEOUS
  Filled 2019-07-17: qty 0.3

## 2019-07-17 MED ORDER — HYDRALAZINE HCL 25 MG PO TABS: 25.0000 mg | ORAL_TABLET | Freq: Three times a day (TID) | ORAL | Status: DC | PRN

## 2019-07-17 MED ORDER — LOPERAMIDE HCL 2 MG PO CAPS
2.0000 mg | ORAL_CAPSULE | ORAL | Status: DC | PRN
  Filled 2019-07-17: qty 1

## 2019-07-17 MED ORDER — DM-GUAIFENESIN ER 30-600 MG PO TB12
1.0000 | ORAL_TABLET | Freq: Two times a day (BID) | ORAL | Status: DC
  Administered 2019-07-18: 1 via ORAL
  Filled 2019-07-17 (×2): qty 1

## 2019-07-17 MED ORDER — SODIUM CHLORIDE 0.9 % IV SOLN
1.0000 g | Freq: Once | INTRAVENOUS | Status: AC
  Administered 2019-07-17: 13:00:00 1 g via INTRAVENOUS
  Filled 2019-07-17: qty 10

## 2019-07-17 MED ORDER — OLANZAPINE 5 MG PO TABS: 7.5000 mg | ORAL_TABLET | Freq: Every day | ORAL | Status: DC

## 2019-07-17 MED ORDER — MAGNESIUM HYDROXIDE 400 MG/5ML PO SUSP
30.0000 mL | Freq: Every evening | ORAL | Status: DC | PRN
Start: 2019-07-17 — End: 2019-07-18

## 2019-07-17 MED ORDER — SODIUM CHLORIDE 0.9 % IV SOLN
1.0000 g | INTRAVENOUS | Status: DC
  Filled 2019-07-17: qty 10

## 2019-07-17 MED ORDER — ALBUTEROL SULFATE (2.5 MG/3ML) 0.083% IN NEBU: 2.5000 mg | INHALATION_SOLUTION | RESPIRATORY_TRACT | Status: DC | PRN

## 2019-07-17 MED ORDER — BACITRACIN-NEOMYCIN-POLYMYXIN 400-5-5000 EX OINT
1.0000 "application " | TOPICAL_OINTMENT | CUTANEOUS | Status: DC | PRN
  Filled 2019-07-17: qty 1

## 2019-07-17 MED ORDER — ONDANSETRON HCL 4 MG/2ML IJ SOLN: 4.0000 mg | Freq: Three times a day (TID) | INTRAMUSCULAR | Status: DC | PRN

## 2019-07-17 MED ORDER — PANTOPRAZOLE SODIUM 40 MG PO TBEC
40.0000 mg | DELAYED_RELEASE_TABLET | Freq: Every day | ORAL | Status: DC
  Administered 2019-07-18: 40 mg via ORAL
  Filled 2019-07-17: qty 1

## 2019-07-17 NOTE — ED Notes (Signed)
Pt transported to MRI 

## 2019-07-17 NOTE — Progress Notes (Signed)
Anticoagulation monitoring(Lovenox):  86 yo female ordered Lovenox 40 mg Q24h  Filed Weights   07/17/19 1058  Weight: 140 lb (63.5 kg)   BMI    Lab Results  Component Value Date   CREATININE 1.12 (H) 07/17/2019   CREATININE 1.09 (H) 07/02/2019   CREATININE 1.27 (H) 12/28/2016   Estimated Creatinine Clearance: 29.4 mL/min (A) (by C-G formula based on SCr of 1.12 mg/dL (H)). Hemoglobin & Hematocrit     Component Value Date/Time   HGB 11.9 (L) 07/17/2019 1106   HCT 36.0 07/17/2019 1106     Per Protocol for Patient with estCrcl < 30 ml/min and BMI < 40, will transition to Lovenox 30 mg Q24rh.

## 2019-07-17 NOTE — ED Provider Notes (Signed)
[facility] Emergency Department Provider Note  ____________________________________________   First MD Initiated Contact with Patient 07/17/19 1053     (approximate)  I have reviewed the triage vital signs and the nursing notes.   HISTORY  Chief Complaint Altered Mental Status    HPI JEMYA VIDRIO is a 86 y.o. female who has a history of intracranial meningioma, hypertension, hypercholesterol who comes in with altered mental status.  Patient reportedly had her second Covid vaccine yesterday.  Patient is normally alert coherent and ambulatory however over the last 1 hour they stated that she is not been acting the same. Pt has had some low grade temps and seems to be wobbly on her feet.    Discussed with staff at 730  She was walking around normal so this was possible last normal?  Then she noted she was pale and just was not acting her normal self.  Per the facility there has been a Covid outbreak at the facility but patient was testing 1 week ago was negative.She just got her covid vaccine.  Patient had a fall previously.  No report of a fall this time.   Unable to get full HPI due to patient's altered mental status.           Past Medical History:  Diagnosis Date  . Anemia   . Arthritis    "left shoulder; left hip" (02/13/2015)  . Asthma   . GERD (gastroesophageal reflux disease)   . Heart murmur dx'd 02/13/2015  . Hypercholesterolemia   . Hypertension   . Intracranial meningioma ([place])   . Melanoma ([place])    "I've had multile melanomas removed"  . Memory loss     Patient Active Problem List   Diagnosis Date Noted  . Acute diverticulitis 11/30/2016  . AKI (acute kidney injury) ([place]) 11/30/2016  . Memory loss   . Anemia due to other cause   . GERD (gastroesophageal reflux disease) 02/13/2015  . Meningioma ([place]) 02/13/2015  . Leg swelling 02/13/2015  . Dementia without behavioral disturbance ([place]) 02/13/2015  . Iron deficiency anemia  due to chronic blood loss 02/13/2015  . Melena 02/13/2015  . Chest pain 01/27/2014  . Hyperlipidemia 01/27/2014  . Hypertension     Past Surgical History:  Procedure Laterality Date  . APPENDECTOMY    . COLONOSCOPY N/A 02/15/2015   Procedure: COLONOSCOPY;  Surgeon: Manus Gunning, MD;  Location: [facility] ENDOSCOPY;  Service: Gastroenterology;  Laterality: N/A;  . ESOPHAGOGASTRODUODENOSCOPY N/A 02/14/2015   Procedure: ESOPHAGOGASTRODUODENOSCOPY (EGD);  Surgeon: Manus Gunning, MD;  Location: [place];  Service: Gastroenterology;  Laterality: N/A;  . GIVENS CAPSULE STUDY N/A 02/17/2015   Procedure: GIVENS CAPSULE STUDY;  Surgeon: Manus Gunning, MD;  Location: [place];  Service: Gastroenterology;  Laterality: N/A;  . JOINT REPLACEMENT    . REPLACEMENT TOTAL KNEE Right   . TOTAL ABDOMINAL HYSTERECTOMY      Prior to Admission medications   Medication Sig Start Date End Date Taking? Authorizing Provider  acetaminophen (TYLENOL) 500 MG tablet Take 1 tablet (500 mg total) by mouth every 6 (six) hours as needed. 06/25/18   Loura Halt A, NP  donepezil (ARICEPT) 10 MG tablet Take 1 tablet (10 mg total) by mouth at bedtime. 09/17/18   Marcial Pacas, MD  memantine (NAMENDA) 10 MG tablet Take 1 tablet (10 mg total) by mouth 2 (two) times daily. 09/17/18   Marcial Pacas, MD    Allergies Patient has no known allergies.  Family  History  Problem Relation Age of Onset  . Cancer Sister   . Cancer Sister   . Dementia Sister     Social History Social History   Tobacco Use  . Smoking status: Never Smoker  . Smokeless tobacco: Never Used  Substance Use Topics  . Alcohol use: No  . Drug use: No      Review of Systems Unable to get full review of systems due to patient's altered mental status.  _________________________   PHYSICAL EXAM:  VITAL SIGNS: Blood pressure (!) 134/54, pulse 87, temperature 99.3 F (37.4 C), temperature source Oral, resp. rate 18, height 5'  (1.524 m), weight 63.5 kg, SpO2 95 %.  Constitutional: Alert and oriented x1. . Eyes: Conjunctivae are normal. Pupils equal in size Pupils are equal in size. Head: Atraumatic. Nose: No congestion/rhinnorhea. Mouth/Throat: Mucous membranes are moist.   Neck: No stridor. Trachea Midline. FROM Cardiovascular: Normal rate, regular rhythm. Grossly normal heart sounds.  Good peripheral circulation. Respiratory: Normal respiratory effort.  No retractions. Lungs CTAB. Gastrointestinal: Soft and nontender. No distention. No abdominal bruits.  Musculoskeletal: No lower extremity tenderness nor edema.  No joint effusions. Neurologic: Difficult to get patient to follow commands.  Seems to have good grip strength bilaterally.  No obvious facial droop.  Patient withdraws to pain on both of her legs. Skin:  Skin is warm, dry and intact. No rash noted. Psychiatric: Unable to fully assess due to altered mental status GU: Deferred   ____________________________________________   LABS (all labs ordered are listed, but only abnormal results are displayed)  Labs Reviewed  CBC WITH DIFFERENTIAL/PLATELET - Abnormal; Notable for the following components:      Result Value   Hemoglobin 11.9 (*)    Lymphs Abs 0.2 (*)    All other components within normal limits  COMPREHENSIVE METABOLIC PANEL - Abnormal; Notable for the following components:   Glucose, Bld 159 (*)    BUN 28 (*)    Creatinine, Ser 1.12 (*)    Calcium 8.6 (*)    GFR calc non Af Amer 44 (*)    GFR calc Af Amer 51 (*)    All other components within normal limits  URINALYSIS, ROUTINE W REFLEX MICROSCOPIC - Abnormal; Notable for the following components:   Color, Urine YELLOW (*)    APPearance CLEAR (*)    Protein, ur 30 (*)    Bacteria, UA MANY (*)    All other components within normal limits  GLUCOSE, CAPILLARY - Abnormal; Notable for the following components:   Glucose-Capillary 166 (*)    All other components within normal limits    RESPIRATORY PANEL BY RT PCR (FLU A&B, COVID)  MAGNESIUM  TSH  T4, FREE  PROCALCITONIN  TROPONIN I (HIGH SENSITIVITY)  TROPONIN I (HIGH SENSITIVITY)   ____________________________________________   ED ECG REPORT I, Vanessa Gregory, the attending physician, personally viewed and interpreted this ECG.  EKG is normal sinus rate 93, next elevation, no T wave in EKG is normal sinus rhythm 93, no ST elevation, no T wave in ____________________________________________  RADIOLOGY   Official radiology report(s): CT Head Wo Contrast  Result Date: 07/17/2019 CLINICAL DATA:  Headache. EXAM: CT HEAD WITHOUT CONTRAST TECHNIQUE: Contiguous axial images were obtained from the base of the skull through the vertex without intravenous contrast. COMPARISON:  Head CT dated 07/02/2019. brain MRI dated 05/23/2016. FINDINGS: Brain: Ventricles are stable in size and configuration. Mild chronic small vessel ischemic changes again noted within the bilateral periventricular and subcortical  white matter regions. Chronic calcified meningioma again appreciated underlying the RIGHT frontoparietal bone, stable. No new intracranial mass, edema or hemorrhage. Vascular: Chronic calcified atherosclerotic changes of the large vessels at the skull base. No unexpected hyperdense vessel. Skull: Normal. Negative for fracture or focal lesion. Sinuses/Orbits: No acute finding. Other: Incompletely imaged masses below the level of the LEFT ear, presumably parotid, also described on earlier CT report of 07/02/2019 with recommendations for ENT referral and follow-up CT neck with contrast. IMPRESSION: 1. No acute intracranial findings. No intracranial mass, edema or hemorrhage. 2. Chronic small vessel ischemic changes within the white matter. 3. Incompletely imaged masses below the level of the LEFT ear, presumably parotid, also described on earlier CT report of 07/02/2019 with recommendation for ENT referral and follow-up CT neck with  contrast to exclude malignancy. Electronically Signed   By: Franki Cabot M.D.   On: 07/17/2019 11:41   DG Pelvis Portable  Result Date: 07/17/2019 CLINICAL DATA:  Fall. EXAM: PORTABLE PELVIS 1-2 VIEWS COMPARISON:  None. FINDINGS: Single-view of the pelvis is provided. Osseous alignment appears normal. No fracture line or displaced fracture fragment seen. Mild degenerative changes at each hip joint. Additional degenerative changes noted within the lower lumbar spine, at least moderate in degree, incompletely imaged. Soft tissues about the pelvis and hips are unremarkable. IMPRESSION: 1. No acute findings. No osseous fracture or dislocation seen. 2. Degenerative changes within the lower lumbar spine and at the hips. Electronically Signed   By: Franki Cabot M.D.   On: 07/17/2019 12:13   DG Chest Portable 1 View  Result Date: 07/17/2019 CLINICAL DATA:  Altered mental status EXAM: PORTABLE CHEST 1 VIEW COMPARISON:  Chest radiograph 07/02/2019 FINDINGS: Stable cardiomediastinal contours. Thoracic aortic calcification. Redemonstrated calcified granuloma in the left upper lung. Minimal linear opacities at the lung bases likely reflect scarring or atelectasis. No focal consolidation. No pneumothorax or large pleural effusion. Severe degenerative changes in the bilateral shoulders. No acute finding in the visualized skeleton. IMPRESSION: No evidence of active disease in the chest. Electronically Signed   By: Audie Pinto M.D.   On: 07/17/2019 11:50    ____________________________________________   PROCEDURES  Procedure(s) performed (including Critical Care):  Procedures   ____________________________________________   INITIAL IMPRESSION / ASSESSMENT AND PLAN / ED COURSE  KALANA STEGENGA was evaluated in Emergency Department on 07/17/2019 for the symptoms described in the history of present illness. She was evaluated in the context of the global COVID-19 pandemic, which necessitated  consideration that the patient might be at risk for infection with the SARS-CoV-2 virus that causes COVID-19. Institutional protocols and algorithms that pertain to the evaluation of patients at risk for COVID-19 are in a state of rapid change based on information released by regulatory bodies including the CDC and federal and state organizations. These policies and algorithms were followed during the patient's care in the ED.    Patient is an 86 year old post Covid vaccine yesterday who comes in with altered mental status.  Patient is altered.  Patient is having difficulty following commands to get a great neuro exam.  Patient's last known normal was 7 AM.  I did discuss with the POA that this could be stroke but it is difficult to tell but it could also be an infection or electrolyte abnormalities.  We discussed TPA and agreed that the risk would outweigh the benefits.  Per POA patient is DNR.  Will get labs to evaluate for electrolyte abnormalities, AKI, UTI.  Chest x-ray to evaluate  for pneumonia.  Will get Covid swab given there is an outbreak at the facility.  Hemoglobin is around baseline.  Creatinine is around baseline at 1.12.  UA does see many bacteria but no WBCs although patient has recently been on antibiotics  [facility] [place].   Chest x-ray and pelvis x-ray are negative  CT scan of the head was negative for acute changes.  Patient was recently on Keflex.  No urine culture was sent.  This can make it difficult to interpret her urine.  Given her slightly elevated temperature she could have a UTI that is causing her to be altered versus a stroke.  Will get a MRI.  Discussed with the MRI team and I think she can go in the next 30 to 45 minutes.  I doubt there is an LVO but again her exam is so inconsistent that if the MRI is positive we would send for a CTA to rule out LVO.  We will give a dose of ceftriaxone for the UTI and discussed the hospital team for admission  I did  update POA on this plan he felt comfortable with it.  I did confirm DNR status once again.  I updated the order in the chart.  We will discuss possible team for admissio  ____________________________________________   FINAL CLINICAL IMPRESSION(S) / ED DIAGNOSES   Final diagnoses:  Altered mental status, unspecified altered mental status type  Acute cystitis without hematuria      MEDICATIONS GIVEN DURING THIS VISIT:  Medications  cefTRIAXone (ROCEPHIN) 1 g in sodium chloride 0.9 % 100 mL IVPB (1 g Intravenous New Bag/Given 07/17/19 1322)     ED Discharge Orders    None       Note:  This document was prepared using Dragon voice recognition software and may include unintentional dictation errors.   Vanessa [place], MD 07/17/19 1327

## 2019-07-17 NOTE — ED Triage Notes (Signed)
Pt via EMS from [facility]. Pt just received her second COVID vaccine. Per staff, is usually alert, coherent, and ambulatory. Within the last hour pt has not been acting the same. Pt does have a hx of dementia. Pt is currently alert and oriented to self. Per staff, pt had a temp 99.7 and she had been "wobbly" on their feet.

## 2019-07-17 NOTE — H&P (Signed)
History and Physical    Sheila West [id number] DOB: 1929-11-17 DOA: 07/17/2019  Referring MD/NP/PA:   PCP: [facility]   Patient coming from:  The patient is coming from [employer].  At baseline, pt is dependent for most of ADL.        Chief Complaint: AMS  HPI: Sheila West is a 86 y.o. female with medical history significant of HTN, hyperlipidemia, asthma, GERD, intracranial meningioma, anemia, melanoma, dementia, CKD stage III, who presents with altered mental status.  Per report, pt had second dose of Covid vaccine yesterday. She was noted to be more confused in the morning.  Last known normal was reportedly at about 730. Patient is normally alert coherent and ambulatory, she was noted to be not acting the same. She seems to be wobbly on her feet. She has had some low grade temps. Her body temperature is 99.3 in ED. She moves all extremities normally.  No active nausea vomiting, diarrhea noted.  No active respiratory distress, cough, shortness of breath noted.  Patient denies any chest pain or abdominal pain. Pt had UTI diagnosed on 1/29, and was treated with Keflex.  ED Course: pt was found to have WBC 7.5, negative Covid PCR, urinalysis (clear appearance, no leukocyte, many bacteria, WBC 0-5), stable renal function, temperature normal, blood pressure 134/54, heart rate 87, RR 22, oxygen saturation 95% on room air.  Chest x-ray negative.  X-ray of pelvis negative.  CT head is negative for acute intracranial abnormalities.  MRI of the brain is negative for stroke.  MRI of brain: 1. Motion degraded examination as described. 2. No evidence of acute intracranial abnormality. 3. 2.5 x 2.0 cm calcified meningioma overlying the right frontal convexity, slightly increased in size since MRI 04/09/2017. There is localized mass effect upon the underlying right frontal lobe with mild parenchymal edema. 4. Stable, mild chronic small vessel ischemic disease. 5. Stable,  moderate generalized parenchymal atrophy. 6. At least two T2 hyperintense masses within the left parotid gland suspicious for primary parotid neoplasms, measuring up to 12 mm. Nonemergent contrast-enhanced neck CT is recommended for further evaluation.   Review of Systems: Could not be reviewed accurately due to altered mental status.  Allergy: No Known Allergies  Past Medical History:  Diagnosis Date  . Anemia   . Arthritis    "left shoulder; left hip" (02/13/2015)  . Asthma   . GERD (gastroesophageal reflux disease)   . Heart murmur dx'd 02/13/2015  . Hypercholesterolemia   . Hypertension   . Intracranial meningioma ([place])   . Melanoma ([place])    "I've had multile melanomas removed"  . Memory loss     Past Surgical History:  Procedure Laterality Date  . APPENDECTOMY    . COLONOSCOPY N/A 02/15/2015   Procedure: COLONOSCOPY;  Surgeon: Manus Gunning, MD;  Location: [facility] ENDOSCOPY;  Service: Gastroenterology;  Laterality: N/A;  . ESOPHAGOGASTRODUODENOSCOPY N/A 02/14/2015   Procedure: ESOPHAGOGASTRODUODENOSCOPY (EGD);  Surgeon: Manus Gunning, MD;  Location: [facility];  Service: Gastroenterology;  Laterality: N/A;  . GIVENS CAPSULE STUDY N/A 02/17/2015   Procedure: GIVENS CAPSULE STUDY;  Surgeon: Manus Gunning, MD;  Location: [facility];  Service: Gastroenterology;  Laterality: N/A;  . JOINT REPLACEMENT    . REPLACEMENT TOTAL KNEE Right   . TOTAL ABDOMINAL HYSTERECTOMY      Social History:  reports that she has never smoked. She has never used smokeless tobacco. She reports that she does not drink alcohol or use drugs.  Family  History:  Family History  Problem Relation Age of Onset  . Cancer Sister   . Cancer Sister   . Dementia Sister      Prior to Admission medications   Medication Sig Start Date End Date Taking? Authorizing Provider  acetaminophen (TYLENOL) 325 MG tablet Take 650 mg by mouth every 6 (six) hours as needed for mild pain.   Yes  [provider]  acetaminophen (TYLENOL) 325 MG tablet Take 325 mg by mouth every 4 (four) hours as needed for mild pain or fever.   Yes [provider]  alum & mag hydroxide-simeth (MINTOX) 200-200-20 MG/5ML suspension Take 30 mLs by mouth 4 (four) times daily as needed for indigestion or heartburn.   Yes [provider]  diclofenac Sodium (VOLTAREN) 1 % GEL Apply 2 g topically 2 (two) times daily. (apply to right knee)   Yes [provider]  donepezil (ARICEPT) 10 MG tablet Take 1 tablet (10 mg total) by mouth at bedtime. 09/17/18  Yes Marcial Pacas, MD  guaiFENesin (ROBITUSSIN) 100 MG/5ML liquid Take 200 mg by mouth every 6 (six) hours as needed for cough.   Yes [provider]  ketoconazole (NIZORAL) 2 % shampoo Apply 1 application topically 2 (two) times a week.   Yes [provider]  loperamide (IMODIUM) 2 MG capsule Take 2 mg by mouth as needed for diarrhea or loose stools.   Yes [provider]  magnesium hydroxide (MILK OF MAGNESIA) 400 MG/5ML suspension Take 30 mLs by mouth at bedtime as needed for mild constipation.   Yes [provider]  meloxicam (MOBIC) 7.5 MG tablet Take 7.5 mg by mouth daily.   Yes [provider]  neomycin-bacitracin-polymyxin (NEOSPORIN) ointment Apply 1 application topically as needed for wound care.   Yes [provider]  OLANZapine (ZYPREXA) 5 MG tablet Take 7.5 mg by mouth daily with supper.   Yes [provider]  omeprazole (PRILOSEC) 20 MG capsule Take 20 mg by mouth daily.   Yes [provider]    Physical Exam: Vitals:   07/17/19 1111 07/17/19 1726 07/17/19 1800 07/17/19 1830  BP: (!) 134/54 (!) 135/94  (!) 146/81  Pulse: 87 95 64 71  Resp: 18 18  17   Temp: 99.3 F (37.4 C)   98.5 F (36.9 C)  TempSrc: Oral   Oral  SpO2: 95% 100% 99% 98%  Weight:      Height:       General: Not in acute distress HEENT:       Eyes: PERRL, EOMI, no scleral  icterus.       ENT: No discharge from the ears and nose, no pharynx injection, no tonsillar enlargement.        Neck: No JVD, no bruit, no mass felt. Heme: No neck lymph node enlargement. Cardiac: S1/S2, RRR, has murmurs, No gallops or rubs. Respiratory: No rales, wheezing, rhonchi or rubs. GI: Soft, nondistended, nontender, no rebound pain, no organomegaly, BS present. GU: No hematuria Ext: No pitting leg edema bilaterally. 2+DP/PT pulse bilaterally. Musculoskeletal: No joint deformities, No joint redness or warmth, no limitation of ROM in spin. Skin: No rashes.  Neuro: confused, knows her own name, but not oriented to place and time, cranial nerves II-XII grossly intact, moves all extremities. Psych: Patient is not psychotic, no suicidal or hemocidal ideation.  Labs on Admission: I have personally reviewed following labs and imaging studies  CBC: Recent Labs  Lab 07/17/19 1106  WBC 7.5  NEUTROABS 6.7  HGB 11.9*  HCT 36.0  MCV 91.4  PLT [id number]   Basic Metabolic Panel: Recent Labs  Lab 07/17/19 1106  NA 136  K 3.7  CL 104  CO2 23  GLUCOSE 159*  BUN 28*  CREATININE 1.12*  CALCIUM 8.6*  MG 1.9   GFR: Estimated Creatinine Clearance: 29.4 mL/min (A) (by C-G formula based on SCr of 1.12 mg/dL (H)). Liver Function Tests: Recent Labs  Lab 07/17/19 1106  AST 18  ALT 10  ALKPHOS 77  BILITOT 0.6  PROT 7.1  ALBUMIN 3.6   No results for input(s): LIPASE, AMYLASE in the last 168 hours. No results for input(s): AMMONIA in the last 168 hours. Coagulation Profile: No results for input(s): INR, PROTIME in the last 168 hours. Cardiac Enzymes: No results for input(s): CKTOTAL, CKMB, CKMBINDEX, TROPONINI in the last 168 hours. BNP (last 3 results) No results for input(s): PROBNP in the last 8760 hours. HbA1C: No results for input(s): HGBA1C in the last 72 hours. CBG: Recent Labs  Lab 07/17/19 1054  GLUCAP 166*   Lipid Profile: No results for input(s): CHOL, HDL,  LDLCALC, TRIG, CHOLHDL, LDLDIRECT in the last 72 hours. Thyroid Function Tests: Recent Labs    07/17/19 1106  TSH 3.343  FREET4 0.84   Anemia Panel: No results for input(s): VITAMINB12, FOLATE, FERRITIN, TIBC, IRON, RETICCTPCT in the last 72 hours. Urine analysis:    Component Value Date/Time   COLORURINE YELLOW (A) 07/17/2019 1106   APPEARANCEUR CLEAR (A) 07/17/2019 1106   LABSPEC 1.017 07/17/2019 1106   PHURINE 6.0 07/17/2019 1106   GLUCOSEU NEGATIVE 07/17/2019 1106   HGBUR NEGATIVE 07/17/2019 1106   [place] 07/17/2019 1106   KETONESUR NEGATIVE 07/17/2019 1106   PROTEINUR 30 (A) 07/17/2019 1106   UROBILINOGEN 0.2 05/21/2010 1825   NITRITE NEGATIVE 07/17/2019 1106   LEUKOCYTESUR NEGATIVE 07/17/2019 1106   Sepsis Labs: @LABRCNTIP (procalcitonin:4,lacticidven:4) ) Recent Results (from the past 240 hour(s))  Respiratory Panel by RT PCR (Flu A&B, Covid) - Nasopharyngeal Swab     Status: None   Collection Time: 07/17/19 11:06 AM   Specimen: Nasopharyngeal Swab  Result Value Ref Range Status   SARS Coronavirus 2 by RT PCR NEGATIVE NEGATIVE Final    Comment: (NOTE) SARS-CoV-2 target nucleic acids are NOT DETECTED. The SARS-CoV-2 RNA is generally detectable in upper respiratoy specimens during the acute phase of infection. The lowest concentration of SARS-CoV-2 viral copies this assay can detect is 131 copies/mL. A negative result does not preclude SARS-Cov-2 infection and should not be used as the sole basis for treatment or other patient management decisions. A negative result may occur with  improper specimen collection/handling, submission of specimen other than nasopharyngeal swab, presence of viral mutation(s) within the areas targeted by this assay, and inadequate number of viral copies (<131 copies/mL). A negative result must be combined with clinical observations, patient history, and epidemiological information. The expected result is Negative. Fact  Sheet for Patients:  PinkCheek.be Fact Sheet for Healthcare Providers:  GravelBags.it This test is not yet ap proved or cleared by the [place] FDA and  has been authorized for detection and/or diagnosis of SARS-CoV-2 by FDA under an Emergency Use Authorization (EUA). This EUA will remain  in effect (meaning this test can be used) for the duration of the COVID-19 declaration under Section 564(b)(1) of the Act, 21 U.S.C. section 360bbb-3(b)(1), unless the authorization is terminated or revoked sooner.    Influenza A by PCR NEGATIVE NEGATIVE Final   Influenza B by  PCR NEGATIVE NEGATIVE Final    Comment: (NOTE) The Xpert Xpress SARS-CoV-2/FLU/RSV assay is intended as an aid in  the diagnosis of influenza from Nasopharyngeal swab specimens and  should not be used as a sole basis for treatment. Nasal washings and  aspirates are unacceptable for Xpert Xpress SARS-CoV-2/FLU/RSV  testing. Fact Sheet for Patients: PinkCheek.be Fact Sheet for Healthcare Providers: GravelBags.it This test is not yet approved or cleared by the [place] FDA and  has been authorized for detection and/or diagnosis of SARS-CoV-2 by  FDA under an Emergency Use Authorization (EUA). This EUA will remain  in effect (meaning this test can be used) for the duration of the  Covid-19 declaration under Section 564(b)(1) of the Act, 21  U.S.C. section 360bbb-3(b)(1), unless the authorization is  terminated or revoked. Performed at [facility], [address] [place], [place] [address]      Radiological Exams on Admission: CT Head Wo Contrast  Result Date: 07/17/2019 CLINICAL DATA:  Headache. EXAM: CT HEAD WITHOUT CONTRAST TECHNIQUE: Contiguous axial images were obtained from the base of the skull through the vertex without intravenous contrast. COMPARISON:  Head CT dated 07/02/2019.  brain MRI dated 05/23/2016. FINDINGS: Brain: Ventricles are stable in size and configuration. Mild chronic small vessel ischemic changes again noted within the bilateral periventricular and subcortical white matter regions. Chronic calcified meningioma again appreciated underlying the RIGHT frontoparietal bone, stable. No new intracranial mass, edema or hemorrhage. Vascular: Chronic calcified atherosclerotic changes of the large vessels at the skull base. No unexpected hyperdense vessel. Skull: Normal. Negative for fracture or focal lesion. Sinuses/Orbits: No acute finding. Other: Incompletely imaged masses below the level of the LEFT ear, presumably parotid, also described on earlier CT report of 07/02/2019 with recommendations for ENT referral and follow-up CT neck with contrast. IMPRESSION: 1. No acute intracranial findings. No intracranial mass, edema or hemorrhage. 2. Chronic small vessel ischemic changes within the white matter. 3. Incompletely imaged masses below the level of the LEFT ear, presumably parotid, also described on earlier CT report of 07/02/2019 with recommendation for ENT referral and follow-up CT neck with contrast to exclude malignancy. Electronically Signed   By: Franki Cabot M.D.   On: 07/17/2019 11:41   MR BRAIN WO CONTRAST  Result Date: 07/17/2019 CLINICAL DATA:  Headache, acute, normal neuro exam. Additional history provided: Patient received second COVID vaccine, usually alert coherent and ambulatory, within the last at our patient has not been acting as usual, history of dementia, widely on feet EXAM: MRI HEAD WITHOUT CONTRAST TECHNIQUE: Multiplanar, multiecho pulse sequences of the brain and surrounding structures were obtained without intravenous contrast. COMPARISON:  Head CT 07/17/2019, head CT 07/02/2019, brain MRI 04/09/2017 FINDINGS: Brain: Intermittently motion degraded examination. Most notably there is severe motion degradation of the sagittal T1 weighted sequence. There  is no evidence of acute infarct. No midline shift or extra-axial fluid collection. No chronic intracranial blood products. A calcified meningioma overlying the right frontal lobe convexity has slightly increased in size since prior MRI 04/09/2017, now measuring 2.5 x 2.0 cm in transaxial dimensions (previously 2.3 x 1.8 cm). Localized mass effect upon the underlying right frontal lobe with mild parenchymal edema. Mild scattered T2/FLAIR hyperintensity within the cerebral white matter is nonspecific, but consistent with chronic small vessel ischemic disease and similar to prior MRI. Moderate generalized parenchymal atrophy, stable. Vascular: Flow voids maintained within the proximal large arterial vessels. Skull and upper cervical spine: Within the limitations of motion degradation, no focal marrow lesion  is identified. Sinuses/Orbits: Visualized orbits demonstrate no acute abnormality. Minimal ethmoid sinus mucosal thickening. No significant mastoid effusion. Other: There are at least two T2 hyperintense masses within the left parotid gland suspicious for primary parotid neoplasms, measuring up to 12 mm. IMPRESSION: 1. Motion degraded examination as described. 2. No evidence of acute intracranial abnormality. 3. 2.5 x 2.0 cm calcified meningioma overlying the right frontal convexity, slightly increased in size since MRI 04/09/2017. There is localized mass effect upon the underlying right frontal lobe with mild parenchymal edema. 4. Stable, mild chronic small vessel ischemic disease. 5. Stable, moderate generalized parenchymal atrophy. 6. At least two T2 hyperintense masses within the left parotid gland suspicious for primary parotid neoplasms, measuring up to 12 mm. Nonemergent contrast-enhanced neck CT is recommended for further evaluation. Electronically Signed   By: Kellie Simmering DO   On: 07/17/2019 14:08   DG Pelvis Portable  Result Date: 07/17/2019 CLINICAL DATA:  Fall. EXAM: PORTABLE PELVIS 1-2 VIEWS  COMPARISON:  None. FINDINGS: Single-view of the pelvis is provided. Osseous alignment appears normal. No fracture line or displaced fracture fragment seen. Mild degenerative changes at each hip joint. Additional degenerative changes noted within the lower lumbar spine, at least moderate in degree, incompletely imaged. Soft tissues about the pelvis and hips are unremarkable. IMPRESSION: 1. No acute findings. No osseous fracture or dislocation seen. 2. Degenerative changes within the lower lumbar spine and at the hips. Electronically Signed   By: Franki Cabot M.D.   On: 07/17/2019 12:13   DG Chest Portable 1 View  Result Date: 07/17/2019 CLINICAL DATA:  Altered mental status EXAM: PORTABLE CHEST 1 VIEW COMPARISON:  Chest radiograph 07/02/2019 FINDINGS: Stable cardiomediastinal contours. Thoracic aortic calcification. Redemonstrated calcified granuloma in the left upper lung. Minimal linear opacities at the lung bases likely reflect scarring or atelectasis. No focal consolidation. No pneumothorax or large pleural effusion. Severe degenerative changes in the bilateral shoulders. No acute finding in the visualized skeleton. IMPRESSION: No evidence of active disease in the chest. Electronically Signed   By: Audie Pinto M.D.   On: 07/17/2019 11:50     EKG: Independently reviewed.  Sinus rhythm, QTC 437, LAE.  No ischemic change  Assessment/Plan Principal Problem:   Acute metabolic encephalopathy Active Problems:   Hypertension   GERD (gastroesophageal reflux disease)   Dementia without behavioral disturbance (HCC)   CKD (chronic kidney disease), stage IIIa   Normocytic anemia   Asthma   UTI (urinary tract infection)   Acute metabolic encephalopathy: Etiology is not clear.  CT head is negative for acute intracranial abnormalities.  CT of MRI is negative for stroke.  Patient does not have focal neurologic findings on physical examination.  Patient was recently treated for UTI with Keflex.   Urinalysis still showed many bacteria, indicating possibly incomplete treatment.  -Placed on MedSurg bed for observation -Frequent neuro check -continue to treat UTI with Rocephin  Recently treated UTI: -rocephin is started in ED -->will continue -f/u Urine culture  Essential hypertension: not taking meds at home. Bp 134/54 -prn Hydralazine  GERD (gastroesophageal reflux disease) -Protonix  Dementia without behavioral disturbance ([place]): -Continue donepezil and olanzapine  CKD (chronic kidney disease), stage IIIa: -f/u by BMP  Normocytic anemia: Hgb stable 11.9 -f/u by CBC  Asthma: stable -prn albuterol   Left parotid mass: CT-head showed a Incompletely imaged masses below the level of the LEFT ear, presumably parotid, also described on earlier CT report of 07/02/2019. CT-C spin on 1/29 showed a partially visible two  small left parotid gland soft tissue masses measuring 16-18 mm diameter. Per radiologist, such parotid lesions are most commonly benign, although these were not apparent on a 2018 head MRI. Recommend ENT referral and follow-up CT Neck (IV contrast preferred). -please giver ENT referral    DVT ppx: SQ Lovenox Code Status: DNR (EDP, Dr. Jari Pigg discussed with POA, pt is DNR). Family Communication: None at bed side Disposition Plan:  Anticipate discharge back to previous [employer] called:  none Admission status: Med-surg bed for obs   Date of Service 07/17/2019    [facility]   If 7PM-7AM, please contact night-coverage [email or web address] 07/17/2019, 6:55 PM

## 2019-07-17 NOTE — ED Notes (Signed)
Attempted to call report on this pt. Secretary states that Risk analyst on the unit has not yet assigned the pt to a nurse yet. Will follow-up.

## 2019-07-18 ENCOUNTER — Other Ambulatory Visit: Payer: Self-pay

## 2019-07-18 DIAGNOSIS — R279 Unspecified lack of coordination: Secondary | ICD-10-CM | POA: Diagnosis not present

## 2019-07-18 DIAGNOSIS — Z743 Need for continuous supervision: Secondary | ICD-10-CM | POA: Diagnosis not present

## 2019-07-18 DIAGNOSIS — I959 Hypotension, unspecified: Secondary | ICD-10-CM | POA: Diagnosis not present

## 2019-07-18 DIAGNOSIS — K219 Gastro-esophageal reflux disease without esophagitis: Secondary | ICD-10-CM

## 2019-07-18 DIAGNOSIS — F0390 Unspecified dementia without behavioral disturbance: Secondary | ICD-10-CM | POA: Diagnosis not present

## 2019-07-18 DIAGNOSIS — J4520 Mild intermittent asthma, uncomplicated: Secondary | ICD-10-CM

## 2019-07-18 DIAGNOSIS — G9341 Metabolic encephalopathy: Secondary | ICD-10-CM | POA: Diagnosis not present

## 2019-07-18 LAB — BASIC METABOLIC PANEL
Anion gap: 7 (ref 5–15)
BUN: 24 mg/dL — ABNORMAL HIGH (ref 8–23)
CO2: 26 mmol/L (ref 22–32)
Calcium: 8.4 mg/dL — ABNORMAL LOW (ref 8.9–10.3)
Chloride: 106 mmol/L (ref 98–111)
Creatinine, Ser: 1.06 mg/dL — ABNORMAL HIGH (ref 0.44–1.00)
GFR calc Af Amer: 55 mL/min — ABNORMAL LOW (ref >60)
GFR calc non Af Amer: 47 mL/min — ABNORMAL LOW (ref >60)
Glucose, Bld: 95 mg/dL (ref 70–99)
Potassium: 3.9 mmol/L (ref 3.5–5.1)
Sodium: 139 mmol/L (ref 135–145)

## 2019-07-18 LAB — CBC
HCT: 33.7 % — ABNORMAL LOW (ref 36.0–46.0)
Hemoglobin: 11.3 g/dL — ABNORMAL LOW (ref 12.0–15.0)
MCH: 30.4 pg (ref 26.0–34.0)
MCHC: 33.5 g/dL (ref 30.0–36.0)
MCV: 90.6 fL (ref 80.0–100.0)
Platelets: 200 10*3/uL (ref 150–400)
RBC: 3.72 MIL/uL — ABNORMAL LOW (ref 3.87–5.11)
RDW: 13.2 % (ref 11.5–15.5)
WBC: 5.6 10*3/uL (ref 4.0–10.5)
nRBC: 0 % (ref 0.0–0.2)

## 2019-07-18 LAB — GLUCOSE, CAPILLARY: Glucose-Capillary: 94 mg/dL (ref 70–99)

## 2019-07-18 LAB — PROCALCITONIN: Procalcitonin: 0.12 ng/mL

## 2019-07-18 MED ORDER — ENOXAPARIN SODIUM 40 MG/0.4ML ~~LOC~~ SOLN: 40.0000 mg | SUBCUTANEOUS | Status: DC

## 2019-07-18 NOTE — NC FL2 (Signed)
[place] LEVEL OF CARE SCREENING TOOL     IDENTIFICATION  Patient Name: Sheila West Birthdate: 11/17/1929 Sex: female Admission Date (Current Location): 07/17/2019  [place] and [place] Number:  Engineering geologist and Address:  [facility], [address] [place], [place] [address]      Provider Number: [id number]  Attending Physician Name and Address:  Fritzi Mandes, MD  Relative Name and Phone Number:  [name] [phone]    Current Level of Care: Hospital Recommended Level of Care: [place] Prior Approval Number:    Date Approved/Denied:   PASRR Number:    Discharge Plan: Other (Comment)([facility])    Current Diagnoses: Patient Active Problem List   Diagnosis Date Noted  . Acute metabolic encephalopathy [id number]  . CKD (chronic kidney disease), stage IIIa 07/17/2019  . Normocytic anemia 07/17/2019  . UTI (urinary tract infection) 07/17/2019  . Asthma   . Acute diverticulitis 11/30/2016  . AKI (acute kidney injury) ([place]) 11/30/2016  . Memory loss   . Anemia due to other cause   . GERD (gastroesophageal reflux disease) 02/13/2015  . Meningioma ([place]) 02/13/2015  . Leg swelling 02/13/2015  . Dementia without behavioral disturbance ([place]) 02/13/2015  . Iron deficiency anemia due to chronic blood loss 02/13/2015  . Melena 02/13/2015  . Chest pain 01/27/2014  . Hyperlipidemia 01/27/2014  . Hypertension     Orientation RESPIRATION BLADDER Height & Weight     Self  Normal(Room Air) Incontinent Weight: 140 lb (63.5 kg) Height:  5' (152.4 cm)  BEHAVIORAL SYMPTOMS/MOOD NEUROLOGICAL BOWEL NUTRITION STATUS      Incontinent Diet(See dishcharge summary)  AMBULATORY STATUS COMMUNICATION OF NEEDS Skin   Limited Assist   Normal                       Personal Care Assistance Level of Assistance  Bathing, Dressing Bathing Assistance: Limited assistance   Dressing Assistance:  Limited assistance     Functional Limitations Info  Speech     Speech Info: Impaired    SPECIAL CARE FACTORS FREQUENCY                       Contractures Contractures Info: Not present    Additional Factors Info  Code Status Code Status Info: DNR             Current Medications (07/18/2019):  This is the current hospital active medication list Current Facility-Administered Medications  Medication Dose Route Frequency Provider Last Rate Last Admin  . acetaminophen (TYLENOL) tablet 650 mg  650 mg Oral Q6H PRN Ivor Costa, MD      . albuterol (PROVENTIL) (2.5 MG/3ML) 0.083% nebulizer solution 2.5 mg  2.5 mg Inhalation Q4H PRN Ivor Costa, MD      . alum & mag hydroxide-simeth (MAALOX/MYLANTA) 200-200-20 MG/5ML suspension 30 mL  30 mL Oral QID PRN Ivor Costa, MD      . dextromethorphan-guaiFENesin [facility] DM) 30-600 MG per 12 hr tablet 1 tablet  1 tablet Oral BID Ivor Costa, MD   1 tablet at 07/18/19 1054  . diclofenac Sodium (VOLTAREN) 1 % topical gel 2 g  2 g Topical BID Ivor Costa, MD      . donepezil (ARICEPT) tablet 10 mg  10 mg Oral QHS Ivor Costa, MD      . enoxaparin (LOVENOX) injection 40 mg  40 mg Subcutaneous Q24H Hallaji, Sheema M, RPH      .  hydrALAZINE (APRESOLINE) tablet 25 mg  25 mg Oral TID PRN Ivor Costa, MD      . loperamide (IMODIUM) capsule 2 mg  2 mg Oral PRN Ivor Costa, MD      . magnesium hydroxide (MILK OF MAGNESIA) suspension 30 mL  30 mL Oral QHS PRN Ivor Costa, MD      . neomycin-bacitracin-polymyxin (NEOSPORIN) ointment packet 1 application  1 application Topical PRN Ivor Costa, MD      . OLANZapine (ZYPREXA) tablet 7.5 mg  7.5 mg Oral Q supper Ivor Costa, MD      . ondansetron [facility]) injection 4 mg  4 mg Intravenous Q8H PRN Ivor Costa, MD      . pantoprazole (PROTONIX) EC tablet 40 mg  40 mg Oral Daily Ivor Costa, MD   40 mg at 07/18/19 1054     Discharge Medications: Please see discharge summary for a list of discharge  medications.  Relevant Imaging Results:  Relevant Lab Results:   Additional Information    Boris Sharper, LCSW

## 2019-07-18 NOTE — Discharge Summary (Signed)
[facility] [place] NAME: Sheila West    MR#:  [id number]  DATE OF BIRTH:  April 16, 1930  DATE OF ADMISSION:  07/17/2019 ADMITTING PHYSICIAN: Ivor Costa, MD  DATE OF DISCHARGE: 07/18/2019  PRIMARY CARE PHYSICIAN: [facility]    ADMISSION DIAGNOSIS:  Acute cystitis without hematuria [N30.00] Altered mental status, unspecified altered mental status type [id number] Acute metabolic encephalopathy [id number]  DISCHARGE DIAGNOSIS:  Acute Encephalopathy-metabolic likely due to mild dehydration  Known h/o Dementia Left parotid small mass--defer w/u per [facility] house calls SECONDARY DIAGNOSIS:   Past Medical History:  Diagnosis Date  . Anemia   . Arthritis    "left shoulder; left hip" (02/13/2015)  . Asthma   . GERD (gastroesophageal reflux disease)   . Heart murmur dx'd 02/13/2015  . Hypercholesterolemia   . Hypertension   . Intracranial meningioma ([place])   . Melanoma ([place])    "I've had multile melanomas removed"  . Memory loss     HOSPITAL COURSE:  Sheila West is a 86 y.o. female with medical history significant of HTN, hyperlipidemia, asthma, GERD, intracranial meningioma, anemia, melanoma, dementia, CKD stage III, who presents with altered mental status. Per report, pt had second dose of Covid vaccine yesterday. She was noted to be more confused in the morning. Had low grade fever of 99  Acute metabolic encephalopathy: exact Etiology is not clear. -Possibilities are immune reaction from 2nd dose of COVID vaccine, mild dehydration - CT head is negative for acute intracranial abnormalities.  - CT of MRI is negative for stroke.  - Patient does not have focal neurologic findings on physical examination.   -Patient was recently treated for UTI with Keflex-- as outpatient   -patient remains afebrileRecently treated UTI: -rocephin is started in ED -patient is afebrile, white count 5.6. Pro calcitonin .12 -creatinine  improved after IV fluids  -Other than bacteria rest of the UA is not impressive.  -No indication for antibiotics at present. Will defer for primary care physician to follow-up.  Essential hypertension:  -not taking meds at home. Bp 134/54 -prn Hydralazine  GERD (gastroesophageal reflux disease) -Protonix  Dementia without behavioral disturbance ([place]): -Continue donepezil and olanzapine  CKD (chronic kidney disease), stage IIIa: -creat improved   Normocytic anemia: Hgb stable 11.9 -f/u by CBC  Asthma: stable -prn albuterol   Incidental Left parotid mass: CT-head showed a Incompletely imaged masses below the level of the LEFT ear, presumably parotid, also described on earlier CT report of 07/02/2019. CT-C spin on 1/29 showed a partially visible two small left parotid gland soft tissue masses measuring 16-18 mm diameter. Per radiologist, such parotid lesions are most commonly benign, although these were not apparent on a 2018 head MRI. Recommend ENT referral and follow-up CT Neck (IV contrast preferred). -ENT referral as out pt--defer to PCP -this was discussed with patient's healthcare power of attorney Mr. Nicole Kindred he acknowledged the information. He will discussed with Dr. is making house call.  Patient overall appears at baseline. She did eat and has good appetite. Vitals stable.  Will discharge her back to [facility].  DVT ppx: SQ Lovenox Code Status: DNR prior to admission Family Communication:spoke with [name] [facility]) Disposition Plan:   discharge back to previous [employer] called:  none  CONSULTS OBTAINED:    DRUG ALLERGIES:  No Known Allergies  DISCHARGE MEDICATIONS:   Allergies as of 07/18/2019   No Known Allergies     Medication List  TAKE these medications   acetaminophen 325 MG tablet Commonly known as: TYLENOL Take 650 mg by mouth every 6 (six) hours as needed for mild pain. What changed: Another medication with the same  name was removed. Continue taking this medication, and follow the directions you see here.   diclofenac Sodium 1 % Gel Commonly known as: VOLTAREN Apply 2 g topically 2 (two) times daily. (apply to right knee)   donepezil 10 MG tablet Commonly known as: ARICEPT Take 1 tablet (10 mg total) by mouth at bedtime.   guaiFENesin 100 MG/5ML liquid Commonly known as: ROBITUSSIN Take 200 mg by mouth every 6 (six) hours as needed for cough.   ketoconazole 2 % shampoo Commonly known as: NIZORAL Apply 1 application topically 2 (two) times a week.   loperamide 2 MG capsule Commonly known as: IMODIUM Take 2 mg by mouth as needed for diarrhea or loose stools.   magnesium hydroxide 400 MG/5ML suspension Commonly known as: MILK OF MAGNESIA Take 30 mLs by mouth at bedtime as needed for mild constipation.   meloxicam 7.5 MG tablet Commonly known as: MOBIC Take 7.5 mg by mouth daily.   Mintox [id number] MG/5ML suspension Generic drug: alum & mag hydroxide-simeth Take 30 mLs by mouth 4 (four) times daily as needed for indigestion or heartburn.   neomycin-bacitracin-polymyxin ointment Commonly known as: NEOSPORIN Apply 1 application topically as needed for wound care.   OLANZapine 5 MG tablet Commonly known as: ZYPREXA Take 7.5 mg by mouth daily with supper.   omeprazole 20 MG capsule Commonly known as: PRILOSEC Take 20 mg by mouth daily.       If you experience worsening of your admission symptoms, develop shortness of breath, life threatening emergency, suicidal or homicidal thoughts you must seek medical attention immediately by calling 911 or calling your MD immediately  if symptoms less severe.  You Must read complete instructions/literature along with all the possible adverse reactions/side effects for all the Medicines you take and that have been prescribed to you. Take any new Medicines after you have completely understood and accept all the possible adverse reactions/side  effects.   Please note  You were cared for by a hospitalist during your hospital stay. If you have any questions about your discharge medications or the care you received while you were in the hospital after you are discharged, you can call the unit and asked to speak with the hospitalist on call if the hospitalist that took care of you is not available. Once you are discharged, your primary care physician will handle any further medical issues. Please note that NO REFILLS for any discharge medications will be authorized once you are discharged, as it is imperative that you return to your primary care physician (or establish a relationship with a primary care physician if you do not have one) for your aftercare needs so that they can reassess your need for medications and monitor your lab values. Today   SUBJECTIVE  does not communicate much. At baseline has dementia. Awake alert. No new issues per RN.  VITAL SIGNS:  Blood pressure (!) 145/58, pulse (!) 50, temperature 97.9 F (36.6 C), resp. rate 18, height 5' (1.524 m), weight 63.5 kg, SpO2 99 %.  I/O:    Intake/Output Summary (Last 24 hours) at 07/18/2019 1231 Last data filed at 07/17/2019 1422 Gross per 24 hour  Intake 100 ml  Output --  Net 100 ml    PHYSICAL EXAMINATION:  GENERAL:  86 y.o.-year-old patient lying in  the bed with no acute distress.  EYES: Pupils equal, round, reactive to light and accommodation. Marland Kitchen  HEENT: Head atraumatic, normocephalic. Oropharynx and nasopharynx clear.  NECK:  Supple, no jugular venous distention. No thyroid enlargement, no tenderness.  LUNGS: Normal breath sounds bilaterally, no wheezing, rales,rhonchi or crepitation. No use of accessory muscles of respiration.  CARDIOVASCULAR: S1, S2 normal. No murmurs, rubs, or gallops.  ABDOMEN: Soft, non-tender, non-distended. Bowel sounds present. No organomegaly or mass.  EXTREMITIES: No pedal edema, cyanosis, or clubbing.  NEUROLOGIC: grossly nonfocal.  Limited exam. Moves all extremities well. PSYCHIATRIC:  patient is alert and awake at baseline has dementia SKIN: No obvious rash, lesion, or ulcer.   DATA REVIEW:   CBC  Recent Labs  Lab 07/18/19 0416  WBC 5.6  HGB 11.3*  HCT 33.7*  PLT 200    Chemistries  Recent Labs  Lab 07/17/19 1106 07/17/19 1106 07/18/19 0416  NA 136   < > 139  K 3.7   < > 3.9  CL 104   < > 106  CO2 23   < > 26  GLUCOSE 159*   < > 95  BUN 28*   < > 24*  CREATININE 1.12*   < > 1.06*  CALCIUM 8.6*   < > 8.4*  MG 1.9  --   --   AST 18  --   --   ALT 10  --   --   ALKPHOS 77  --   --   BILITOT 0.6  --   --    < > = values in this interval not displayed.    Microbiology Results   Recent Results (from the past 240 hour(s))  Respiratory Panel by RT PCR (Flu A&B, Covid) - Nasopharyngeal Swab     Status: None   Collection Time: 07/17/19 11:06 AM   Specimen: Nasopharyngeal Swab  Result Value Ref Range Status   SARS Coronavirus 2 by RT PCR NEGATIVE NEGATIVE Final    Comment: (NOTE) SARS-CoV-2 target nucleic acids are NOT DETECTED. The SARS-CoV-2 RNA is generally detectable in upper respiratoy specimens during the acute phase of infection. The lowest concentration of SARS-CoV-2 viral copies this assay can detect is 131 copies/mL. A negative result does not preclude SARS-Cov-2 infection and should not be used as the sole basis for treatment or other patient management decisions. A negative result may occur with  improper specimen collection/handling, submission of specimen other than nasopharyngeal swab, presence of viral mutation(s) within the areas targeted by this assay, and inadequate number of viral copies (<131 copies/mL). A negative result must be combined with clinical observations, patient history, and epidemiological information. The expected result is Negative. Fact Sheet for Patients:  PinkCheek.be Fact Sheet for Healthcare Providers:   GravelBags.it This test is not yet ap proved or cleared by the [place] FDA and  has been authorized for detection and/or diagnosis of SARS-CoV-2 by FDA under an Emergency Use Authorization (EUA). This EUA will remain  in effect (meaning this test can be used) for the duration of the COVID-19 declaration under Section 564(b)(1) of the Act, 21 U.S.C. section 360bbb-3(b)(1), unless the authorization is terminated or revoked sooner.    Influenza A by PCR NEGATIVE NEGATIVE Final   Influenza B by PCR NEGATIVE NEGATIVE Final    Comment: (NOTE) The Xpert Xpress SARS-CoV-2/FLU/RSV assay is intended as an aid in  the diagnosis of influenza from Nasopharyngeal swab specimens and  should not be used as a sole basis for treatment.  Nasal washings and  aspirates are unacceptable for Xpert Xpress SARS-CoV-2/FLU/RSV  testing. Fact Sheet for Patients: PinkCheek.be Fact Sheet for Healthcare Providers: GravelBags.it This test is not yet approved or cleared by the [place] FDA and  has been authorized for detection and/or diagnosis of SARS-CoV-2 by  FDA under an Emergency Use Authorization (EUA). This EUA will remain  in effect (meaning this test can be used) for the duration of the  Covid-19 declaration under Section 564(b)(1) of the Act, 21  U.S.C. section 360bbb-3(b)(1), unless the authorization is  terminated or revoked. Performed at [facility], [address] [place], [place] [address]     RADIOLOGY:  CT Head Wo Contrast  Result Date: 07/17/2019 CLINICAL DATA:  Headache. EXAM: CT HEAD WITHOUT CONTRAST TECHNIQUE: Contiguous axial images were obtained from the base of the skull through the vertex without intravenous contrast. COMPARISON:  Head CT dated 07/02/2019. brain MRI dated 05/23/2016. FINDINGS: Brain: Ventricles are stable in size and configuration. Mild chronic small vessel  ischemic changes again noted within the bilateral periventricular and subcortical white matter regions. Chronic calcified meningioma again appreciated underlying the RIGHT frontoparietal bone, stable. No new intracranial mass, edema or hemorrhage. Vascular: Chronic calcified atherosclerotic changes of the large vessels at the skull base. No unexpected hyperdense vessel. Skull: Normal. Negative for fracture or focal lesion. Sinuses/Orbits: No acute finding. Other: Incompletely imaged masses below the level of the LEFT ear, presumably parotid, also described on earlier CT report of 07/02/2019 with recommendations for ENT referral and follow-up CT neck with contrast. IMPRESSION: 1. No acute intracranial findings. No intracranial mass, edema or hemorrhage. 2. Chronic small vessel ischemic changes within the white matter. 3. Incompletely imaged masses below the level of the LEFT ear, presumably parotid, also described on earlier CT report of 07/02/2019 with recommendation for ENT referral and follow-up CT neck with contrast to exclude malignancy. Electronically Signed   By: Franki Cabot M.D.   On: 07/17/2019 11:41   MR BRAIN WO CONTRAST  Result Date: 07/17/2019 CLINICAL DATA:  Headache, acute, normal neuro exam. Additional history provided: Patient received second COVID vaccine, usually alert coherent and ambulatory, within the last at our patient has not been acting as usual, history of dementia, widely on feet EXAM: MRI HEAD WITHOUT CONTRAST TECHNIQUE: Multiplanar, multiecho pulse sequences of the brain and surrounding structures were obtained without intravenous contrast. COMPARISON:  Head CT 07/17/2019, head CT 07/02/2019, brain MRI 04/09/2017 FINDINGS: Brain: Intermittently motion degraded examination. Most notably there is severe motion degradation of the sagittal T1 weighted sequence. There is no evidence of acute infarct. No midline shift or extra-axial fluid collection. No chronic intracranial blood  products. A calcified meningioma overlying the right frontal lobe convexity has slightly increased in size since prior MRI 04/09/2017, now measuring 2.5 x 2.0 cm in transaxial dimensions (previously 2.3 x 1.8 cm). Localized mass effect upon the underlying right frontal lobe with mild parenchymal edema. Mild scattered T2/FLAIR hyperintensity within the cerebral white matter is nonspecific, but consistent with chronic small vessel ischemic disease and similar to prior MRI. Moderate generalized parenchymal atrophy, stable. Vascular: Flow voids maintained within the proximal large arterial vessels. Skull and upper cervical spine: Within the limitations of motion degradation, no focal marrow lesion is identified. Sinuses/Orbits: Visualized orbits demonstrate no acute abnormality. Minimal ethmoid sinus mucosal thickening. No significant mastoid effusion. Other: There are at least two T2 hyperintense masses within the left parotid gland suspicious for primary parotid neoplasms, measuring up to 12 mm. IMPRESSION: 1.  Motion degraded examination as described. 2. No evidence of acute intracranial abnormality. 3. 2.5 x 2.0 cm calcified meningioma overlying the right frontal convexity, slightly increased in size since MRI 04/09/2017. There is localized mass effect upon the underlying right frontal lobe with mild parenchymal edema. 4. Stable, mild chronic small vessel ischemic disease. 5. Stable, moderate generalized parenchymal atrophy. 6. At least two T2 hyperintense masses within the left parotid gland suspicious for primary parotid neoplasms, measuring up to 12 mm. Nonemergent contrast-enhanced neck CT is recommended for further evaluation. Electronically Signed   By: Kellie Simmering DO   On: 07/17/2019 14:08   DG Pelvis Portable  Result Date: 07/17/2019 CLINICAL DATA:  Fall. EXAM: PORTABLE PELVIS 1-2 VIEWS COMPARISON:  None. FINDINGS: Single-view of the pelvis is provided. Osseous alignment appears normal. No fracture line  or displaced fracture fragment seen. Mild degenerative changes at each hip joint. Additional degenerative changes noted within the lower lumbar spine, at least moderate in degree, incompletely imaged. Soft tissues about the pelvis and hips are unremarkable. IMPRESSION: 1. No acute findings. No osseous fracture or dislocation seen. 2. Degenerative changes within the lower lumbar spine and at the hips. Electronically Signed   By: Franki Cabot M.D.   On: 07/17/2019 12:13   DG Chest Portable 1 View  Result Date: 07/17/2019 CLINICAL DATA:  Altered mental status EXAM: PORTABLE CHEST 1 VIEW COMPARISON:  Chest radiograph 07/02/2019 FINDINGS: Stable cardiomediastinal contours. Thoracic aortic calcification. Redemonstrated calcified granuloma in the left upper lung. Minimal linear opacities at the lung bases likely reflect scarring or atelectasis. No focal consolidation. No pneumothorax or large pleural effusion. Severe degenerative changes in the bilateral shoulders. No acute finding in the visualized skeleton. IMPRESSION: No evidence of active disease in the chest. Electronically Signed   By: Audie Pinto M.D.   On: 07/17/2019 11:50     CODE STATUS:     Code Status Orders  (From admission, onward)         Start     Ordered   07/17/19 1858  Do not attempt resuscitation (DNR)  Continuous    Question Answer Comment  In the event of cardiac or respiratory ARREST Do not call a "code blue"   In the event of cardiac or respiratory ARREST Do not perform Intubation, CPR, defibrillation or ACLS   In the event of cardiac or respiratory ARREST Use medication by any route, position, wound care, and other measures to relive pain and suffering. May use oxygen, suction and manual treatment of airway obstruction as needed for comfort.      07/17/19 1857        Code Status History    Date Active Date Inactive Code Status Order ID Comments User Context   07/17/2019 1303 07/17/2019 1857 DNR [id number]  Vanessa Hadley, MD ED   02/13/2015 1720 02/17/2015 2326 Full Code [id number]  Barton Dubois, MD Inpatient   01/27/2014 0647 01/28/2014 1325 Full Code [id number]  Theressa Millard, MD Inpatient   Advance Care Planning Activity       TOTAL TIME TAKING CARE OF THIS PATIENT: *40* minutes.    Fritzi Mandes M.D  [facility]    CC: Primary care physician; [facility]

## 2019-07-18 NOTE — Progress Notes (Signed)
Report called to [facility]. Will dress patient and send her with non-emergent transport EMS.

## 2019-07-18 NOTE — Plan of Care (Signed)

## 2019-07-18 NOTE — Progress Notes (Signed)
Patient ambulated around the room with a walker and standby assist. Dr. Posey Pronto and SW aware.

## 2019-07-18 NOTE — TOC Transition Note (Addendum)
Transition of Care [facility]) - CM/SW Discharge Note   Patient Details  Name: Sheila West MRN: [id number] Date of Birth: January 20, 1930  Transition of Care [facility]) CM/SW Contact:  Boris Sharper, LCSW Phone Number: [phone] 07/18/2019, 1:13 PM   Clinical Narrative:    Pt is medically stable for discharge back to [facility] today. Pt's family, and facility notified of DC. Discharge Summary sent to facility.  Pt will be in room 316 call to report number is [phone]. Ambulance transport requested for patient         Patient Goals and CMS Choice        Discharge Placement                       Discharge Plan and Services                                     Social Determinants of Health (SDOH) Interventions     Readmission Risk Interventions No flowsheet data found.

## 2019-07-18 NOTE — Progress Notes (Signed)
PHARMACIST - PHYSICIAN COMMUNICATION  CONCERNING:  Enoxaparin (Lovenox) for DVT Prophylaxis    RECOMMENDATION: Patient was prescribed enoxaprin 30mg  q24 hours for VTE prophylaxis.   Filed Weights   07/17/19 1058  Weight: 140 lb (63.5 kg)    Body mass index is 27.34 kg/m.  Estimated Creatinine Clearance: 31.1 mL/min (A) (by C-G formula based on SCr of 1.06 mg/dL (H)).  Patient is candidate for enoxaparin 40mg  every 24 hours based on CrCl >42ml/min   DESCRIPTION: Pharmacy has adjusted enoxaparin dose per [facility] policy.  Patient is now receiving enoxaparin 40mg  every 24 hours.   Pernell Dupre, PharmD, BCPS Clinical Pharmacist 07/18/2019 7:13 AM

## 2019-07-18 NOTE — NC FL2 (Signed)
[place] LEVEL OF CARE SCREENING TOOL     IDENTIFICATION  Patient Name: Sheila West Birthdate: 19-Jul-1929 Sex: female Admission Date (Current Location): 07/17/2019  [place] and [place] Number:  Engineering geologist and Address:  [facility], [address], [place], [place] [address]      Provider Number: [id number]  Attending Physician Name and Address:  Fritzi Mandes, MD  Relative Name and Phone Number:  [name] [phone]    Current Level of Care: Hospital Recommended Level of Care: [place] Prior Approval Number:    Date Approved/Denied:   PASRR Number:    Discharge Plan: Other (Comment)([place] [facility] facility)    Current Diagnoses: Patient Active Problem List   Diagnosis Date Noted  . Acute metabolic encephalopathy [id number]  . CKD (chronic kidney disease), stage IIIa 07/17/2019  . Normocytic anemia 07/17/2019  . UTI (urinary tract infection) 07/17/2019  . Asthma   . Acute diverticulitis 11/30/2016  . AKI (acute kidney injury) ([place]) 11/30/2016  . Memory loss   . Anemia due to other cause   . GERD (gastroesophageal reflux disease) 02/13/2015  . Meningioma ([place]) 02/13/2015  . Leg swelling 02/13/2015  . Dementia without behavioral disturbance ([place]) 02/13/2015  . Iron deficiency anemia due to chronic blood loss 02/13/2015  . Melena 02/13/2015  . Chest pain 01/27/2014  . Hyperlipidemia 01/27/2014  . Hypertension     Orientation RESPIRATION BLADDER Height & Weight     Self  Normal(Room Air) Incontinent Weight: 140 lb (63.5 kg) Height:  5' (152.4 cm)  BEHAVIORAL SYMPTOMS/MOOD NEUROLOGICAL BOWEL NUTRITION STATUS      Incontinent Diet(See dishcharge summary)  AMBULATORY STATUS COMMUNICATION OF NEEDS Skin   Limited Assist   Normal                       Personal Care Assistance Level of Assistance  Bathing, Dressing Bathing Assistance: Limited assistance   Dressing Assistance:  Limited assistance     Functional Limitations Info  Speech     Speech Info: Impaired    SPECIAL CARE FACTORS FREQUENCY                       Contractures Contractures Info: Not present    Additional Factors Info  Code Status Code Status Info: DNR             Current Medications (07/18/2019):  This is the current hospital active medication list Current Facility-Administered Medications  Medication Dose Route Frequency Provider Last Rate Last Admin  . acetaminophen (TYLENOL) tablet 650 mg  650 mg Oral Q6H PRN Ivor Costa, MD      . albuterol (PROVENTIL) (2.5 MG/3ML) 0.083% nebulizer solution 2.5 mg  2.5 mg Inhalation Q4H PRN Ivor Costa, MD      . alum & mag hydroxide-simeth (MAALOX/MYLANTA) 200-200-20 MG/5ML suspension 30 mL  30 mL Oral QID PRN Ivor Costa, MD      . dextromethorphan-guaiFENesin [facility] DM) 30-600 MG per 12 hr tablet 1 tablet  1 tablet Oral BID Ivor Costa, MD   1 tablet at 07/18/19 1054  . diclofenac Sodium (VOLTAREN) 1 % topical gel 2 g  2 g Topical BID Ivor Costa, MD      . donepezil (ARICEPT) tablet 10 mg  10 mg Oral QHS Ivor Costa, MD      . enoxaparin (LOVENOX) injection 40 mg  40 mg Subcutaneous Q24H Hallaji, Sheema M, RPH      .  hydrALAZINE (APRESOLINE) tablet 25 mg  25 mg Oral TID PRN Ivor Costa, MD      . loperamide (IMODIUM) capsule 2 mg  2 mg Oral PRN Ivor Costa, MD      . magnesium hydroxide (MILK OF MAGNESIA) suspension 30 mL  30 mL Oral QHS PRN Ivor Costa, MD      . neomycin-bacitracin-polymyxin (NEOSPORIN) ointment packet 1 application  1 application Topical PRN Ivor Costa, MD      . OLANZapine (ZYPREXA) tablet 7.5 mg  7.5 mg Oral Q supper Ivor Costa, MD      . ondansetron [facility]) injection 4 mg  4 mg Intravenous Q8H PRN Ivor Costa, MD      . pantoprazole (PROTONIX) EC tablet 40 mg  40 mg Oral Daily Ivor Costa, MD   40 mg at 07/18/19 1054     Discharge Medications: Please see discharge summary for a list of discharge  medications.  Relevant Imaging Results:  Relevant Lab Results:   Additional Information    Boris Sharper, LCSW

## 2019-07-18 NOTE — Progress Notes (Signed)
Patient set up with with meal tray and is able to feed self with no signs of choking or trouble swallowing. She does cut food into appropriate bite-sized pieces. Dr. Posey Pronto aware.

## 2019-07-19 DIAGNOSIS — F0391 Unspecified dementia with behavioral disturbance: Secondary | ICD-10-CM | POA: Diagnosis not present

## 2019-07-19 DIAGNOSIS — R3989 Other symptoms and signs involving the genitourinary system: Secondary | ICD-10-CM | POA: Diagnosis not present

## 2019-07-19 DIAGNOSIS — R269 Unspecified abnormalities of gait and mobility: Secondary | ICD-10-CM | POA: Diagnosis not present

## 2019-07-19 DIAGNOSIS — R531 Weakness: Secondary | ICD-10-CM | POA: Diagnosis not present

## 2019-07-19 DIAGNOSIS — G9340 Encephalopathy, unspecified: Secondary | ICD-10-CM | POA: Diagnosis not present

## 2019-07-19 DIAGNOSIS — K118 Other diseases of salivary glands: Secondary | ICD-10-CM | POA: Diagnosis not present

## 2019-07-20 DIAGNOSIS — Z20828 Contact with and (suspected) exposure to other viral communicable diseases: Secondary | ICD-10-CM | POA: Diagnosis not present

## 2019-07-20 LAB — URINE CULTURE: Culture: >=100000 — AB

## 2019-07-22 DIAGNOSIS — K5792 Diverticulitis of intestine, part unspecified, without perforation or abscess without bleeding: Secondary | ICD-10-CM | POA: Diagnosis not present

## 2019-07-22 DIAGNOSIS — F0390 Unspecified dementia without behavioral disturbance: Secondary | ICD-10-CM | POA: Diagnosis not present

## 2019-07-22 DIAGNOSIS — M8580 Other specified disorders of bone density and structure, unspecified site: Secondary | ICD-10-CM | POA: Diagnosis not present

## 2019-07-22 DIAGNOSIS — D500 Iron deficiency anemia secondary to blood loss (chronic): Secondary | ICD-10-CM | POA: Diagnosis not present

## 2019-07-22 DIAGNOSIS — N189 Chronic kidney disease, unspecified: Secondary | ICD-10-CM | POA: Diagnosis not present

## 2019-07-22 DIAGNOSIS — M4800 Spinal stenosis, site unspecified: Secondary | ICD-10-CM | POA: Diagnosis not present

## 2019-07-22 DIAGNOSIS — D329 Benign neoplasm of meninges, unspecified: Secondary | ICD-10-CM | POA: Diagnosis not present

## 2019-07-22 DIAGNOSIS — I129 Hypertensive chronic kidney disease with stage 1 through stage 4 chronic kidney disease, or unspecified chronic kidney disease: Secondary | ICD-10-CM | POA: Diagnosis not present

## 2019-07-22 DIAGNOSIS — E785 Hyperlipidemia, unspecified: Secondary | ICD-10-CM | POA: Diagnosis not present

## 2019-07-23 DIAGNOSIS — N390 Urinary tract infection, site not specified: Secondary | ICD-10-CM | POA: Diagnosis not present

## 2019-07-23 DIAGNOSIS — Z79899 Other long term (current) drug therapy: Secondary | ICD-10-CM | POA: Diagnosis not present

## 2019-07-27 DIAGNOSIS — D500 Iron deficiency anemia secondary to blood loss (chronic): Secondary | ICD-10-CM | POA: Diagnosis not present

## 2019-07-27 DIAGNOSIS — E785 Hyperlipidemia, unspecified: Secondary | ICD-10-CM | POA: Diagnosis not present

## 2019-07-27 DIAGNOSIS — Z20828 Contact with and (suspected) exposure to other viral communicable diseases: Secondary | ICD-10-CM | POA: Diagnosis not present

## 2019-07-27 DIAGNOSIS — N189 Chronic kidney disease, unspecified: Secondary | ICD-10-CM | POA: Diagnosis not present

## 2019-07-27 DIAGNOSIS — F0390 Unspecified dementia without behavioral disturbance: Secondary | ICD-10-CM | POA: Diagnosis not present

## 2019-07-27 DIAGNOSIS — K5792 Diverticulitis of intestine, part unspecified, without perforation or abscess without bleeding: Secondary | ICD-10-CM | POA: Diagnosis not present

## 2019-07-27 DIAGNOSIS — D329 Benign neoplasm of meninges, unspecified: Secondary | ICD-10-CM | POA: Diagnosis not present

## 2019-07-27 DIAGNOSIS — I129 Hypertensive chronic kidney disease with stage 1 through stage 4 chronic kidney disease, or unspecified chronic kidney disease: Secondary | ICD-10-CM | POA: Diagnosis not present

## 2019-07-27 DIAGNOSIS — M8580 Other specified disorders of bone density and structure, unspecified site: Secondary | ICD-10-CM | POA: Diagnosis not present

## 2019-07-27 DIAGNOSIS — M4800 Spinal stenosis, site unspecified: Secondary | ICD-10-CM | POA: Diagnosis not present

## 2019-07-28 DIAGNOSIS — M4800 Spinal stenosis, site unspecified: Secondary | ICD-10-CM | POA: Diagnosis not present

## 2019-07-28 DIAGNOSIS — N189 Chronic kidney disease, unspecified: Secondary | ICD-10-CM | POA: Diagnosis not present

## 2019-07-28 DIAGNOSIS — I129 Hypertensive chronic kidney disease with stage 1 through stage 4 chronic kidney disease, or unspecified chronic kidney disease: Secondary | ICD-10-CM | POA: Diagnosis not present

## 2019-07-28 DIAGNOSIS — D329 Benign neoplasm of meninges, unspecified: Secondary | ICD-10-CM | POA: Diagnosis not present

## 2019-07-28 DIAGNOSIS — M8580 Other specified disorders of bone density and structure, unspecified site: Secondary | ICD-10-CM | POA: Diagnosis not present

## 2019-07-28 DIAGNOSIS — K5792 Diverticulitis of intestine, part unspecified, without perforation or abscess without bleeding: Secondary | ICD-10-CM | POA: Diagnosis not present

## 2019-07-28 DIAGNOSIS — F0390 Unspecified dementia without behavioral disturbance: Secondary | ICD-10-CM | POA: Diagnosis not present

## 2019-07-28 DIAGNOSIS — D500 Iron deficiency anemia secondary to blood loss (chronic): Secondary | ICD-10-CM | POA: Diagnosis not present

## 2019-07-28 DIAGNOSIS — E785 Hyperlipidemia, unspecified: Secondary | ICD-10-CM | POA: Diagnosis not present

## 2019-07-29 DIAGNOSIS — D329 Benign neoplasm of meninges, unspecified: Secondary | ICD-10-CM | POA: Diagnosis not present

## 2019-07-29 DIAGNOSIS — K5792 Diverticulitis of intestine, part unspecified, without perforation or abscess without bleeding: Secondary | ICD-10-CM | POA: Diagnosis not present

## 2019-07-29 DIAGNOSIS — M4800 Spinal stenosis, site unspecified: Secondary | ICD-10-CM | POA: Diagnosis not present

## 2019-07-29 DIAGNOSIS — F0390 Unspecified dementia without behavioral disturbance: Secondary | ICD-10-CM | POA: Diagnosis not present

## 2019-07-29 DIAGNOSIS — I129 Hypertensive chronic kidney disease with stage 1 through stage 4 chronic kidney disease, or unspecified chronic kidney disease: Secondary | ICD-10-CM | POA: Diagnosis not present

## 2019-07-29 DIAGNOSIS — D500 Iron deficiency anemia secondary to blood loss (chronic): Secondary | ICD-10-CM | POA: Diagnosis not present

## 2019-07-29 DIAGNOSIS — N189 Chronic kidney disease, unspecified: Secondary | ICD-10-CM | POA: Diagnosis not present

## 2019-07-29 DIAGNOSIS — E785 Hyperlipidemia, unspecified: Secondary | ICD-10-CM | POA: Diagnosis not present

## 2019-07-29 DIAGNOSIS — M8580 Other specified disorders of bone density and structure, unspecified site: Secondary | ICD-10-CM | POA: Diagnosis not present

## 2019-08-02 DIAGNOSIS — E785 Hyperlipidemia, unspecified: Secondary | ICD-10-CM | POA: Diagnosis not present

## 2019-08-02 DIAGNOSIS — M8949 Other hypertrophic osteoarthropathy, multiple sites: Secondary | ICD-10-CM | POA: Diagnosis not present

## 2019-08-02 DIAGNOSIS — D329 Benign neoplasm of meninges, unspecified: Secondary | ICD-10-CM | POA: Diagnosis not present

## 2019-08-02 DIAGNOSIS — M8580 Other specified disorders of bone density and structure, unspecified site: Secondary | ICD-10-CM | POA: Diagnosis not present

## 2019-08-02 DIAGNOSIS — I129 Hypertensive chronic kidney disease with stage 1 through stage 4 chronic kidney disease, or unspecified chronic kidney disease: Secondary | ICD-10-CM | POA: Diagnosis not present

## 2019-08-02 DIAGNOSIS — K5792 Diverticulitis of intestine, part unspecified, without perforation or abscess without bleeding: Secondary | ICD-10-CM | POA: Diagnosis not present

## 2019-08-02 DIAGNOSIS — F0391 Unspecified dementia with behavioral disturbance: Secondary | ICD-10-CM | POA: Diagnosis not present

## 2019-08-02 DIAGNOSIS — D500 Iron deficiency anemia secondary to blood loss (chronic): Secondary | ICD-10-CM | POA: Diagnosis not present

## 2019-08-02 DIAGNOSIS — N189 Chronic kidney disease, unspecified: Secondary | ICD-10-CM | POA: Diagnosis not present

## 2019-08-02 DIAGNOSIS — K219 Gastro-esophageal reflux disease without esophagitis: Secondary | ICD-10-CM | POA: Diagnosis not present

## 2019-08-02 DIAGNOSIS — M4800 Spinal stenosis, site unspecified: Secondary | ICD-10-CM | POA: Diagnosis not present

## 2019-08-02 DIAGNOSIS — I10 Essential (primary) hypertension: Secondary | ICD-10-CM | POA: Diagnosis not present

## 2019-08-02 DIAGNOSIS — F0390 Unspecified dementia without behavioral disturbance: Secondary | ICD-10-CM | POA: Diagnosis not present

## 2019-08-02 DIAGNOSIS — Z20828 Contact with and (suspected) exposure to other viral communicable diseases: Secondary | ICD-10-CM | POA: Diagnosis not present

## 2019-08-02 DIAGNOSIS — K118 Other diseases of salivary glands: Secondary | ICD-10-CM | POA: Diagnosis not present

## 2019-08-09 DIAGNOSIS — Z20828 Contact with and (suspected) exposure to other viral communicable diseases: Secondary | ICD-10-CM | POA: Diagnosis not present

## 2019-08-16 DIAGNOSIS — W19XXXA Unspecified fall, initial encounter: Secondary | ICD-10-CM | POA: Diagnosis not present

## 2019-08-16 DIAGNOSIS — M25552 Pain in left hip: Secondary | ICD-10-CM | POA: Diagnosis not present

## 2019-08-16 DIAGNOSIS — M25562 Pain in left knee: Secondary | ICD-10-CM | POA: Diagnosis not present

## 2019-08-16 DIAGNOSIS — R3989 Other symptoms and signs involving the genitourinary system: Secondary | ICD-10-CM | POA: Diagnosis not present

## 2019-08-16 DIAGNOSIS — R269 Unspecified abnormalities of gait and mobility: Secondary | ICD-10-CM | POA: Diagnosis not present

## 2019-08-16 DIAGNOSIS — S7002XA Contusion of left hip, initial encounter: Secondary | ICD-10-CM | POA: Diagnosis not present

## 2019-08-16 DIAGNOSIS — I10 Essential (primary) hypertension: Secondary | ICD-10-CM | POA: Diagnosis not present

## 2019-08-16 DIAGNOSIS — M25532 Pain in left wrist: Secondary | ICD-10-CM | POA: Diagnosis not present

## 2019-08-16 DIAGNOSIS — F0391 Unspecified dementia with behavioral disturbance: Secondary | ICD-10-CM | POA: Diagnosis not present

## 2019-08-16 DIAGNOSIS — M8949 Other hypertrophic osteoarthropathy, multiple sites: Secondary | ICD-10-CM | POA: Diagnosis not present

## 2019-08-16 DIAGNOSIS — Z20828 Contact with and (suspected) exposure to other viral communicable diseases: Secondary | ICD-10-CM | POA: Diagnosis not present

## 2019-08-19 DIAGNOSIS — S63502A Unspecified sprain of left wrist, initial encounter: Secondary | ICD-10-CM | POA: Diagnosis not present

## 2019-08-24 DIAGNOSIS — L84 Corns and callosities: Secondary | ICD-10-CM | POA: Diagnosis not present

## 2019-08-24 DIAGNOSIS — I739 Peripheral vascular disease, unspecified: Secondary | ICD-10-CM | POA: Diagnosis not present

## 2019-08-24 DIAGNOSIS — I509 Heart failure, unspecified: Secondary | ICD-10-CM | POA: Diagnosis not present

## 2019-08-24 DIAGNOSIS — B351 Tinea unguium: Secondary | ICD-10-CM | POA: Diagnosis not present

## 2019-08-31 DIAGNOSIS — F0391 Unspecified dementia with behavioral disturbance: Secondary | ICD-10-CM | POA: Diagnosis not present

## 2019-09-02 ENCOUNTER — Emergency Department
Admission: EM | Admit: 2019-09-02 | Discharge: 2019-09-03 | Disposition: A | Discharge status: Home / Self Care | Payer: Medicare HMO | Attending: Emergency Medicine | Admitting: Emergency Medicine

## 2019-09-02 ENCOUNTER — Emergency Department: Payer: Medicare HMO

## 2019-09-02 ENCOUNTER — Other Ambulatory Visit: Payer: Self-pay

## 2019-09-02 DIAGNOSIS — D110 Benign neoplasm of parotid gland: Secondary | ICD-10-CM | POA: Diagnosis not present

## 2019-09-02 DIAGNOSIS — R52 Pain, unspecified: Secondary | ICD-10-CM | POA: Diagnosis not present

## 2019-09-02 DIAGNOSIS — W19XXXA Unspecified fall, initial encounter: Secondary | ICD-10-CM | POA: Diagnosis not present

## 2019-09-02 DIAGNOSIS — I10 Essential (primary) hypertension: Secondary | ICD-10-CM | POA: Diagnosis not present

## 2019-09-02 DIAGNOSIS — I129 Hypertensive chronic kidney disease with stage 1 through stage 4 chronic kidney disease, or unspecified chronic kidney disease: Secondary | ICD-10-CM | POA: Diagnosis not present

## 2019-09-02 DIAGNOSIS — K118 Other diseases of salivary glands: Secondary | ICD-10-CM | POA: Insufficient documentation

## 2019-09-02 DIAGNOSIS — Z96651 Presence of right artificial knee joint: Secondary | ICD-10-CM | POA: Diagnosis not present

## 2019-09-02 DIAGNOSIS — N1831 Chronic kidney disease, stage 3a: Secondary | ICD-10-CM | POA: Diagnosis not present

## 2019-09-02 DIAGNOSIS — Z79899 Other long term (current) drug therapy: Secondary | ICD-10-CM | POA: Diagnosis not present

## 2019-09-02 DIAGNOSIS — F0390 Unspecified dementia without behavioral disturbance: Secondary | ICD-10-CM | POA: Insufficient documentation

## 2019-09-02 DIAGNOSIS — R279 Unspecified lack of coordination: Secondary | ICD-10-CM | POA: Diagnosis not present

## 2019-09-02 DIAGNOSIS — J45909 Unspecified asthma, uncomplicated: Secondary | ICD-10-CM | POA: Diagnosis not present

## 2019-09-02 DIAGNOSIS — D329 Benign neoplasm of meninges, unspecified: Secondary | ICD-10-CM | POA: Diagnosis not present

## 2019-09-02 DIAGNOSIS — R519 Headache, unspecified: Secondary | ICD-10-CM | POA: Diagnosis present

## 2019-09-02 DIAGNOSIS — Z743 Need for continuous supervision: Secondary | ICD-10-CM | POA: Diagnosis not present

## 2019-09-02 NOTE — ED Triage Notes (Addendum)
Pt in via EMS from [facility] d/t swelling at L side of face. Staff at [facility] unsure if pt had fall. Rash to L cheek at knot. History of dementia. Pt currently alert and cooperative. VSS with EMS; slight HTN noted. Having hard time getting accurate BP as pt keeps bending/tensing up arm in confusion; educated about BP cuff and reassured.

## 2019-09-02 NOTE — ED Notes (Signed)
[place] yellow socks applied to pt's feet; bed locked low; rails up; call bell within reach; door open; pt close to nurses station.

## 2019-09-02 NOTE — ED Notes (Addendum)
Report called to [facility], pt awaiting EMS transport. [name] POA contacted and updated on results and disposition.

## 2019-09-02 NOTE — ED Provider Notes (Signed)
[facility] Emergency Department Provider Note  Time seen: 9:44 PM  I have reviewed the triage vital signs and the nursing notes.   HISTORY  Chief Complaint Facial Pain and Fall  HPI Sheila West is a 86 y.o. female with a past medical history of anemia, arthritis, asthma, reflux, hypertension, presents to the emergency department for a possible fall and left facial pain.  According to EMS patient has dementia, lives at her nursing facility, they noted the patient to be sitting on her bed with a large lump on the left side of her face and pain to the area.  Is not clear if the patient fell and hit her head but the nurse who is caring for the patient at her facility states this bump he is new.  Patient does state pain to this area.  Cannot provide any meaningful history or review of systems.   Past Medical History:  Diagnosis Date  . Anemia   . Arthritis    "left shoulder; left hip" (02/13/2015)  . Asthma   . GERD (gastroesophageal reflux disease)   . Heart murmur dx'd 02/13/2015  . Hypercholesterolemia   . Hypertension   . Intracranial meningioma ([place])   . Melanoma ([place])    "I've had multile melanomas removed"  . Memory loss     Patient Active Problem List   Diagnosis Date Noted  . Acute metabolic encephalopathy [id number]  . CKD (chronic kidney disease), stage IIIa 07/17/2019  . Normocytic anemia 07/17/2019  . UTI (urinary tract infection) 07/17/2019  . Asthma   . Acute diverticulitis 11/30/2016  . AKI (acute kidney injury) ([place]) 11/30/2016  . Memory loss   . Anemia due to other cause   . GERD (gastroesophageal reflux disease) 02/13/2015  . Meningioma ([place]) 02/13/2015  . Leg swelling 02/13/2015  . Dementia without behavioral disturbance ([place]) 02/13/2015  . Iron deficiency anemia due to chronic blood loss 02/13/2015  . Melena 02/13/2015  . Chest pain 01/27/2014  . Hyperlipidemia 01/27/2014  . Hypertension     Past Surgical History:   Procedure Laterality Date  . APPENDECTOMY    . COLONOSCOPY N/A 02/15/2015   Procedure: COLONOSCOPY;  Surgeon: Manus Gunning, MD;  Location: [facility] ENDOSCOPY;  Service: Gastroenterology;  Laterality: N/A;  . ESOPHAGOGASTRODUODENOSCOPY N/A 02/14/2015   Procedure: ESOPHAGOGASTRODUODENOSCOPY (EGD);  Surgeon: Manus Gunning, MD;  Location: [place];  Service: Gastroenterology;  Laterality: N/A;  . GIVENS CAPSULE STUDY N/A 02/17/2015   Procedure: GIVENS CAPSULE STUDY;  Surgeon: Manus Gunning, MD;  Location: [place];  Service: Gastroenterology;  Laterality: N/A;  . JOINT REPLACEMENT    . REPLACEMENT TOTAL KNEE Right   . TOTAL ABDOMINAL HYSTERECTOMY      Prior to Admission medications   Medication Sig Start Date End Date Taking? Authorizing Provider  acetaminophen (TYLENOL) 325 MG tablet Take 650 mg by mouth every 6 (six) hours as needed for mild pain.    [provider]  alum & mag hydroxide-simeth (MINTOX) 200-200-20 MG/5ML suspension Take 30 mLs by mouth 4 (four) times daily as needed for indigestion or heartburn.    [provider]  diclofenac Sodium (VOLTAREN) 1 % GEL Apply 2 g topically 2 (two) times daily. (apply to right knee)    [provider]  donepezil (ARICEPT) 10 MG tablet Take 1 tablet (10 mg total) by mouth at bedtime. 09/17/18   Marcial Pacas, MD  guaiFENesin (ROBITUSSIN) 100 MG/5ML liquid Take 200 mg by mouth every 6 (six) hours  as needed for cough.    [provider]  ketoconazole (NIZORAL) 2 % shampoo Apply 1 application topically 2 (two) times a week.    [provider]  loperamide (IMODIUM) 2 MG capsule Take 2 mg by mouth as needed for diarrhea or loose stools.    [provider]  magnesium hydroxide (MILK OF MAGNESIA) 400 MG/5ML suspension Take 30 mLs by mouth at bedtime as needed for mild constipation.    [provider]  meloxicam (MOBIC) 7.5 MG tablet Take 7.5 mg by mouth daily.     [provider]  neomycin-bacitracin-polymyxin (NEOSPORIN) ointment Apply 1 application topically as needed for wound care.    [provider]  OLANZapine (ZYPREXA) 5 MG tablet Take 7.5 mg by mouth daily with supper.    [provider]  omeprazole (PRILOSEC) 20 MG capsule Take 20 mg by mouth daily.    [provider]    No Known Allergies  Family History  Problem Relation Age of Onset  . Cancer Sister   . Cancer Sister   . Dementia Sister     Social History Social History   Tobacco Use  . Smoking status: Never Smoker  . Smokeless tobacco: Never Used  Substance Use Topics  . Alcohol use: No  . Drug use: No    Review of Systems Unable to obtain an adequate/accurate review of systems secondary to baseline dementia.  ____________________________________________   PHYSICAL EXAM:  VITAL SIGNS: ED Triage Vitals [09/02/19 2140]  Enc Vitals Group     BP      Pulse      Resp      Temp 98.3 F (36.8 C)     Temp Source Oral     SpO2      Weight 143 lb (64.9 kg)     Height 5\' 5"  (1.651 m)     Head Circumference      Peak Flow      Pain Score 0     Pain Loc      Pain Edu?      Excl. in [place]?    Constitutional: Patient is awake and alert.  Lying in bed comfortably.  No acute distress. Eyes: Normal exam ENT      Head: Patient does have a firm tender mass to her left parotid gland area.  It is fairly firm to touch could possibly be a hematoma although it feels much more like a cyst.      Mouth/Throat: Mucous membranes are moist. Cardiovascular: Normal rate, regular rhythm. Respiratory: Normal respiratory effort without tachypnea nor retractions. Breath sounds are clear Gastrointestinal: Soft and nontender. No distention Musculoskeletal: Nontender with normal range of motion in all extremities.  Neurologic:  Normal speech and language. No gross focal neurologic deficits  Skin:  Skin is warm, dry and intact.  Psychiatric: Mood and affect  are normal.   ____________________________________________    EKG  EKG viewed and interpreted by myself shows a normal sinus rhythm at 90 bpm with a narrow QRS, normal axis, normal intervals, no concerning ST changes.  ____________________________________________    RADIOLOGY  2.4 x 3 cm left parotid mass concerning for neoplasm.  Mild central necrosis.  Several surrounding nodes concerning for necrotic nodes. ____________________________________________   INITIAL IMPRESSION / ASSESSMENT AND PLAN / ED COURSE  Pertinent labs & imaging results that were available during my care of the patient were reviewed by me and considered in my medical decision making (see chart for details).  Patient presents to the emergency department for possible fall and left facial swelling/discomfort.  Patient does have a palpable mass to the left side of her face over the parotid gland area.  Feels more like a cyst it is quite tender, but per EMS the nursing home insisted that it is new.  We will obtain CT imaging of the head and face as a precaution.  Patient has good range of motion in all extremities, no other signs of trauma on exam.  CT shows progressive enlargement of a parotid mass possible neoplasm now measuring 2.4 x 3 cm.  No traumatic findings.  With mild central necrosis 6 could be the cause of the discomfort to the area.  I believe patient needs to follow-up with ENT and oncology for biopsy and to discuss treatment options.  This does not appear to be an acute issue.   Sheila West was evaluated in Emergency Department on 09/02/2019 for the symptoms described in the history of present illness. She was evaluated in the context of the global COVID-19 pandemic, which necessitated consideration that the patient might be at risk for infection with the SARS-CoV-2 virus that causes COVID-19. Institutional protocols and algorithms that pertain to the evaluation of patients at risk for COVID-19 are in a  state of rapid change based on information released by regulatory bodies including the CDC and federal and state organizations. These policies and algorithms were followed during the patient's care in the ED.  ____________________________________________   FINAL CLINICAL IMPRESSION(S) / ED DIAGNOSES  Parotid mass   Harvest Dark, MD 09/02/19 2251

## 2019-09-02 NOTE — Discharge Instructions (Addendum)
Sheila West has been seen in the emergency department tonight for a mass to her left face.  This is consistent with a parotid mass/tumor possibly cancerous.  Please call the number provided for oncology and ENT to arrange follow-up appointments this coming week for reevaluation and possible biopsy.

## 2019-09-02 NOTE — ED Notes (Signed)
Paige RN able to collect bloodwork but then vein burst. Bloodwork sent to lab.

## 2019-09-03 NOTE — ED Notes (Signed)
Pt discharged to [facility] via EMS

## 2019-09-06 ENCOUNTER — Other Ambulatory Visit: Payer: Self-pay | Admitting: Unknown Physician Specialty

## 2019-09-06 DIAGNOSIS — R269 Unspecified abnormalities of gait and mobility: Secondary | ICD-10-CM | POA: Diagnosis not present

## 2019-09-06 DIAGNOSIS — D37030 Neoplasm of uncertain behavior of the parotid salivary glands: Secondary | ICD-10-CM | POA: Diagnosis not present

## 2019-09-06 DIAGNOSIS — F0150 Vascular dementia without behavioral disturbance: Secondary | ICD-10-CM | POA: Diagnosis not present

## 2019-09-06 DIAGNOSIS — K118 Other diseases of salivary glands: Secondary | ICD-10-CM | POA: Diagnosis not present

## 2019-09-06 DIAGNOSIS — M159 Polyosteoarthritis, unspecified: Secondary | ICD-10-CM | POA: Diagnosis not present

## 2019-09-06 DIAGNOSIS — F0391 Unspecified dementia with behavioral disturbance: Secondary | ICD-10-CM | POA: Diagnosis not present

## 2019-09-07 DIAGNOSIS — I10 Essential (primary) hypertension: Secondary | ICD-10-CM | POA: Diagnosis not present

## 2019-09-07 DIAGNOSIS — K118 Other diseases of salivary glands: Secondary | ICD-10-CM | POA: Diagnosis not present

## 2019-09-10 DIAGNOSIS — F0391 Unspecified dementia with behavioral disturbance: Secondary | ICD-10-CM | POA: Diagnosis not present

## 2019-09-13 ENCOUNTER — Ambulatory Visit: Admission: RE | Admit: 2019-09-13 | Payer: Medicare HMO | Source: Ambulatory Visit

## 2019-09-14 DIAGNOSIS — F0391 Unspecified dementia with behavioral disturbance: Secondary | ICD-10-CM | POA: Diagnosis not present

## 2019-09-20 ENCOUNTER — Encounter: Payer: Self-pay | Admitting: Emergency Medicine

## 2019-09-20 ENCOUNTER — Other Ambulatory Visit: Payer: Self-pay

## 2019-09-20 ENCOUNTER — Emergency Department: Payer: Medicare HMO

## 2019-09-20 ENCOUNTER — Emergency Department
Admission: EM | Admit: 2019-09-20 | Discharge: 2019-09-20 | Disposition: A | Discharge status: Skilled Nursing Facility | Payer: Medicare HMO | Attending: Emergency Medicine | Admitting: Emergency Medicine

## 2019-09-20 DIAGNOSIS — N1831 Chronic kidney disease, stage 3a: Secondary | ICD-10-CM | POA: Insufficient documentation

## 2019-09-20 DIAGNOSIS — S199XXA Unspecified injury of neck, initial encounter: Secondary | ICD-10-CM | POA: Diagnosis not present

## 2019-09-20 DIAGNOSIS — S0990XA Unspecified injury of head, initial encounter: Secondary | ICD-10-CM | POA: Diagnosis not present

## 2019-09-20 DIAGNOSIS — R0902 Hypoxemia: Secondary | ICD-10-CM | POA: Diagnosis not present

## 2019-09-20 DIAGNOSIS — Z79899 Other long term (current) drug therapy: Secondary | ICD-10-CM | POA: Diagnosis not present

## 2019-09-20 DIAGNOSIS — Z96651 Presence of right artificial knee joint: Secondary | ICD-10-CM | POA: Diagnosis not present

## 2019-09-20 DIAGNOSIS — R279 Unspecified lack of coordination: Secondary | ICD-10-CM | POA: Diagnosis not present

## 2019-09-20 DIAGNOSIS — Z85820 Personal history of malignant melanoma of skin: Secondary | ICD-10-CM | POA: Diagnosis not present

## 2019-09-20 DIAGNOSIS — I129 Hypertensive chronic kidney disease with stage 1 through stage 4 chronic kidney disease, or unspecified chronic kidney disease: Secondary | ICD-10-CM | POA: Diagnosis not present

## 2019-09-20 DIAGNOSIS — W19XXXA Unspecified fall, initial encounter: Secondary | ICD-10-CM | POA: Diagnosis not present

## 2019-09-20 DIAGNOSIS — R52 Pain, unspecified: Secondary | ICD-10-CM | POA: Diagnosis not present

## 2019-09-20 DIAGNOSIS — J45909 Unspecified asthma, uncomplicated: Secondary | ICD-10-CM | POA: Diagnosis not present

## 2019-09-20 DIAGNOSIS — R404 Transient alteration of awareness: Secondary | ICD-10-CM | POA: Diagnosis not present

## 2019-09-20 DIAGNOSIS — Z743 Need for continuous supervision: Secondary | ICD-10-CM | POA: Diagnosis not present

## 2019-09-20 DIAGNOSIS — F0390 Unspecified dementia without behavioral disturbance: Secondary | ICD-10-CM | POA: Insufficient documentation

## 2019-09-20 NOTE — ED Notes (Signed)
Called [facility] and let them know pt was up for DC. Facility informed me there transport service was out of service and EMS would need to trx pr.

## 2019-09-20 NOTE — ED Provider Notes (Addendum)
[facility] Emergency Department Provider Note   ____________________________________________   First MD Initiated Contact with Patient 09/20/19 1406     (approximate)  I have reviewed the triage vital signs and the nursing notes.   HISTORY  Chief Complaint Fall    HPI Sheila West is a 86 y.o. female with possible history of dementia, hypertension, hyperlipidemia, and GERD who presents to the ED following fall.  History is limited due to patient's baseline dementia.  Per EMS, patient was found down on the ground at [facility] memory care after last being seen about 1 hour prior to fall.  Patient had complained of poorly localized pain and EMS was called to bring patient to the ED for further evaluation.  Patient currently states that she has minimal pain and "I feel fine if I do not move".  She is not sure whether she hit her head or lost consciousness, reportedly does not take any blood thinners and is at her baseline mental status.        Past Medical History:  Diagnosis Date  . Anemia   . Arthritis    "left shoulder; left hip" (02/13/2015)  . Asthma   . GERD (gastroesophageal reflux disease)   . Heart murmur dx'd 02/13/2015  . Hypercholesterolemia   . Hypertension   . Intracranial meningioma ([place])   . Melanoma ([place])    "I've had multile melanomas removed"  . Memory loss     Patient Active Problem List   Diagnosis Date Noted  . Acute metabolic encephalopathy [id number]  . CKD (chronic kidney disease), stage IIIa 07/17/2019  . Normocytic anemia 07/17/2019  . UTI (urinary tract infection) 07/17/2019  . Asthma   . Acute diverticulitis 11/30/2016  . AKI (acute kidney injury) ([place]) 11/30/2016  . Memory loss   . Anemia due to other cause   . GERD (gastroesophageal reflux disease) 02/13/2015  . Meningioma ([place]) 02/13/2015  . Leg swelling 02/13/2015  . Dementia without behavioral disturbance ([place]) 02/13/2015  . Iron deficiency anemia  due to chronic blood loss 02/13/2015  . Melena 02/13/2015  . Chest pain 01/27/2014  . Hyperlipidemia 01/27/2014  . Hypertension     Past Surgical History:  Procedure Laterality Date  . APPENDECTOMY    . COLONOSCOPY N/A 02/15/2015   Procedure: COLONOSCOPY;  Surgeon: Manus Gunning, MD;  Location: [facility] ENDOSCOPY;  Service: Gastroenterology;  Laterality: N/A;  . ESOPHAGOGASTRODUODENOSCOPY N/A 02/14/2015   Procedure: ESOPHAGOGASTRODUODENOSCOPY (EGD);  Surgeon: Manus Gunning, MD;  Location: [place];  Service: Gastroenterology;  Laterality: N/A;  . GIVENS CAPSULE STUDY N/A 02/17/2015   Procedure: GIVENS CAPSULE STUDY;  Surgeon: Manus Gunning, MD;  Location: [place];  Service: Gastroenterology;  Laterality: N/A;  . JOINT REPLACEMENT    . REPLACEMENT TOTAL KNEE Right   . TOTAL ABDOMINAL HYSTERECTOMY      Prior to Admission medications   Medication Sig Start Date End Date Taking? Authorizing Provider  acetaminophen (TYLENOL) 325 MG tablet Take 650 mg by mouth every 6 (six) hours as needed for mild pain.    [provider]  alum & mag hydroxide-simeth (MINTOX) 200-200-20 MG/5ML suspension Take 30 mLs by mouth 4 (four) times daily as needed for indigestion or heartburn.    [provider]  diclofenac Sodium (VOLTAREN) 1 % GEL Apply 2 g topically 2 (two) times daily. (apply to right knee)    [provider]  donepezil (ARICEPT) 10 MG tablet Take 1 tablet (10 mg total) by  mouth at bedtime. 09/17/18   Marcial Pacas, MD  guaiFENesin (ROBITUSSIN) 100 MG/5ML liquid Take 200 mg by mouth every 6 (six) hours as needed for cough.    [provider]  ketoconazole (NIZORAL) 2 % shampoo Apply 1 application topically 2 (two) times a week.    [provider]  loperamide (IMODIUM) 2 MG capsule Take 2 mg by mouth as needed for diarrhea or loose stools.    [provider]  magnesium hydroxide (MILK OF MAGNESIA) 400 MG/5ML suspension  Take 30 mLs by mouth at bedtime as needed for mild constipation.    [provider]  meloxicam (MOBIC) 7.5 MG tablet Take 7.5 mg by mouth daily.    [provider]  neomycin-bacitracin-polymyxin (NEOSPORIN) ointment Apply 1 application topically as needed for wound care.    [provider]  OLANZapine (ZYPREXA) 5 MG tablet Take 7.5 mg by mouth daily with supper.    [provider]  omeprazole (PRILOSEC) 20 MG capsule Take 20 mg by mouth daily.    [provider]    Allergies Patient has no known allergies.  Family History  Problem Relation Age of Onset  . Cancer Sister   . Cancer Sister   . Dementia Sister     Social History Social History   Tobacco Use  . Smoking status: Never Smoker  . Smokeless tobacco: Never Used  Substance Use Topics  . Alcohol use: No  . Drug use: No    Review of Systems Unable to obtain secondary to dementia ____________________________________________   PHYSICAL EXAM:  VITAL SIGNS: ED Triage Vitals  Enc Vitals Group     BP      Pulse      Resp      Temp      Temp src      SpO2      Weight      Height      Head Circumference      Peak Flow      Pain Score      Pain Loc      Pain Edu?      Excl. in [place]?     Constitutional: Awake and alert. Eyes: Conjunctivae are normal. Head: Atraumatic. Nose: No congestion/rhinnorhea. Mouth/Throat: Mucous membranes are moist. Neck: Normal ROM, no midline cervical spine tenderness. Cardiovascular: Normal rate, regular rhythm. Grossly normal heart sounds. Respiratory: Normal respiratory effort.  No retractions. Lungs CTAB. Gastrointestinal: Soft and nontender. No distention. Genitourinary: deferred Musculoskeletal: No lower extremity tenderness nor edema.  No tenderness along bilateral upper and lower extremities, pelvis stable. Neurologic:  Normal speech and language. No gross focal neurologic deficits are appreciated. Skin:  Skin is warm, dry and  intact. No rash noted. Psychiatric: Mood and affect are normal. Speech and behavior are normal.  ____________________________________________   LABS (all labs ordered are listed, but only abnormal results are displayed)  Labs Reviewed - No data to display   PROCEDURES  Procedure(s) performed (including Critical Care):  Procedures   ____________________________________________   INITIAL IMPRESSION / ASSESSMENT AND PLAN / ED COURSE       86 year old female with history of dementia presents to the ED following unwitnessed fall at memory care facility.  Patient is calm and comfortable here in stretcher with no extremity bony tenderness.  She also has no chest wall or abdominal tenderness.  No signs of trauma noted to head or neck area, will screen CT head and C-spine, but if these are negative patient will  be appropriate for discharge back to nursing facility.  CT head and C-spine are negative for acute process, but do show parotid lesions that are actively being followed by ENT.  There is no evidence of acute infection to this area.  She is appropriate for discharge back to nursing facility with plan for outpatient ENT follow-up.  I spoke with patient's medical power of attorney over the phone, he is aware of parotid lesion and has follow-up scheduled with ENT.      ____________________________________________   FINAL CLINICAL IMPRESSION(S) / ED DIAGNOSES  Final diagnoses:  Fall, initial encounter  Dementia without behavioral disturbance, unspecified dementia type [facility])     ED Discharge Orders    None       Note:  This document was prepared using Dragon voice recognition software and may include unintentional dictation errors.   Blake Divine, MD 09/20/19 1518    Blake Divine, MD 09/20/19 [phone]

## 2019-09-20 NOTE — ED Triage Notes (Signed)
Pt arrived via [facility] following an unwitnessed fall at [facility], EMS stated they saw pt an hour prior to fall. Pt has hx of dementia.

## 2019-09-20 NOTE — ED Notes (Signed)
Pt arrives from [facility] following an unwitnessed fall. Staff told EMS they saw the pt one hour prior to the fall. Pt has hx of dementia. Pt unable to verbalize pain, was able to move toes and fingers for this RN.

## 2019-09-20 NOTE — ED Notes (Signed)
[facility]  CALLED  FOR  TRANSPORT   TO  [facility] 

## 2019-09-22 ENCOUNTER — Ambulatory Visit: Payer: Medicare HMO | Admitting: Neurology

## 2019-09-23 DIAGNOSIS — Z79899 Other long term (current) drug therapy: Secondary | ICD-10-CM | POA: Diagnosis not present

## 2019-09-23 DIAGNOSIS — F0391 Unspecified dementia with behavioral disturbance: Secondary | ICD-10-CM | POA: Diagnosis not present

## 2019-09-23 DIAGNOSIS — R2681 Unsteadiness on feet: Secondary | ICD-10-CM | POA: Diagnosis not present

## 2019-09-23 DIAGNOSIS — K116 Mucocele of salivary gland: Secondary | ICD-10-CM | POA: Diagnosis not present

## 2019-10-10 ENCOUNTER — Emergency Department: Payer: Medicare HMO

## 2019-10-10 ENCOUNTER — Encounter: Payer: Self-pay | Admitting: Emergency Medicine

## 2019-10-10 ENCOUNTER — Inpatient Hospital Stay
Admission: EM | Admit: 2019-10-10 | Discharge: 2019-10-14 | DRG: 689 | Disposition: A | Discharge status: Hospice | Payer: Medicare HMO | Source: Skilled Nursing Facility | Attending: Family Medicine | Admitting: Family Medicine

## 2019-10-10 ENCOUNTER — Other Ambulatory Visit: Payer: Self-pay

## 2019-10-10 DIAGNOSIS — E7800 Pure hypercholesterolemia, unspecified: Secondary | ICD-10-CM | POA: Diagnosis present

## 2019-10-10 DIAGNOSIS — E785 Hyperlipidemia, unspecified: Secondary | ICD-10-CM | POA: Diagnosis present

## 2019-10-10 DIAGNOSIS — W19XXXA Unspecified fall, initial encounter: Secondary | ICD-10-CM | POA: Diagnosis not present

## 2019-10-10 DIAGNOSIS — M1612 Unilateral primary osteoarthritis, left hip: Secondary | ICD-10-CM | POA: Diagnosis present

## 2019-10-10 DIAGNOSIS — R32 Unspecified urinary incontinence: Secondary | ICD-10-CM | POA: Diagnosis present

## 2019-10-10 DIAGNOSIS — K118 Other diseases of salivary glands: Secondary | ICD-10-CM

## 2019-10-10 DIAGNOSIS — Z85820 Personal history of malignant melanoma of skin: Secondary | ICD-10-CM

## 2019-10-10 DIAGNOSIS — M19012 Primary osteoarthritis, left shoulder: Secondary | ICD-10-CM | POA: Diagnosis not present

## 2019-10-10 DIAGNOSIS — Z66 Do not resuscitate: Secondary | ICD-10-CM | POA: Diagnosis not present

## 2019-10-10 DIAGNOSIS — N1831 Chronic kidney disease, stage 3a: Secondary | ICD-10-CM | POA: Diagnosis present

## 2019-10-10 DIAGNOSIS — Z791 Long term (current) use of non-steroidal anti-inflammatories (NSAID): Secondary | ICD-10-CM | POA: Diagnosis not present

## 2019-10-10 DIAGNOSIS — Z7189 Other specified counseling: Secondary | ICD-10-CM | POA: Diagnosis not present

## 2019-10-10 DIAGNOSIS — D329 Benign neoplasm of meninges, unspecified: Secondary | ICD-10-CM | POA: Diagnosis present

## 2019-10-10 DIAGNOSIS — Z515 Encounter for palliative care: Secondary | ICD-10-CM | POA: Diagnosis not present

## 2019-10-10 DIAGNOSIS — J45909 Unspecified asthma, uncomplicated: Secondary | ICD-10-CM | POA: Diagnosis present

## 2019-10-10 DIAGNOSIS — G9340 Encephalopathy, unspecified: Secondary | ICD-10-CM | POA: Diagnosis present

## 2019-10-10 DIAGNOSIS — I129 Hypertensive chronic kidney disease with stage 1 through stage 4 chronic kidney disease, or unspecified chronic kidney disease: Secondary | ICD-10-CM | POA: Diagnosis present

## 2019-10-10 DIAGNOSIS — R410 Disorientation, unspecified: Secondary | ICD-10-CM | POA: Diagnosis not present

## 2019-10-10 DIAGNOSIS — R4182 Altered mental status, unspecified: Secondary | ICD-10-CM | POA: Diagnosis not present

## 2019-10-10 DIAGNOSIS — Z20822 Contact with and (suspected) exposure to covid-19: Secondary | ICD-10-CM | POA: Diagnosis present

## 2019-10-10 DIAGNOSIS — R519 Headache, unspecified: Secondary | ICD-10-CM | POA: Diagnosis not present

## 2019-10-10 DIAGNOSIS — F0390 Unspecified dementia without behavioral disturbance: Secondary | ICD-10-CM | POA: Diagnosis not present

## 2019-10-10 DIAGNOSIS — D320 Benign neoplasm of cerebral meninges: Secondary | ICD-10-CM | POA: Diagnosis present

## 2019-10-10 DIAGNOSIS — D110 Benign neoplasm of parotid gland: Secondary | ICD-10-CM | POA: Diagnosis not present

## 2019-10-10 DIAGNOSIS — I10 Essential (primary) hypertension: Secondary | ICD-10-CM | POA: Diagnosis not present

## 2019-10-10 DIAGNOSIS — R1310 Dysphagia, unspecified: Secondary | ICD-10-CM | POA: Diagnosis present

## 2019-10-10 DIAGNOSIS — R52 Pain, unspecified: Secondary | ICD-10-CM | POA: Diagnosis not present

## 2019-10-10 DIAGNOSIS — N3000 Acute cystitis without hematuria: Secondary | ICD-10-CM | POA: Diagnosis not present

## 2019-10-10 DIAGNOSIS — K219 Gastro-esophageal reflux disease without esophagitis: Secondary | ICD-10-CM | POA: Diagnosis present

## 2019-10-10 DIAGNOSIS — Z9181 History of falling: Secondary | ICD-10-CM | POA: Diagnosis not present

## 2019-10-10 DIAGNOSIS — D631 Anemia in chronic kidney disease: Secondary | ICD-10-CM | POA: Diagnosis present

## 2019-10-10 DIAGNOSIS — G9341 Metabolic encephalopathy: Secondary | ICD-10-CM | POA: Diagnosis present

## 2019-10-10 DIAGNOSIS — F0391 Unspecified dementia with behavioral disturbance: Secondary | ICD-10-CM | POA: Diagnosis present

## 2019-10-10 DIAGNOSIS — S199XXA Unspecified injury of neck, initial encounter: Secondary | ICD-10-CM | POA: Diagnosis not present

## 2019-10-10 DIAGNOSIS — R404 Transient alteration of awareness: Secondary | ICD-10-CM | POA: Diagnosis not present

## 2019-10-10 DIAGNOSIS — N390 Urinary tract infection, site not specified: Principal | ICD-10-CM | POA: Diagnosis present

## 2019-10-10 DIAGNOSIS — Z96651 Presence of right artificial knee joint: Secondary | ICD-10-CM | POA: Diagnosis not present

## 2019-10-10 DIAGNOSIS — R221 Localized swelling, mass and lump, neck: Secondary | ICD-10-CM | POA: Diagnosis present

## 2019-10-10 DIAGNOSIS — R471 Dysarthria and anarthria: Secondary | ICD-10-CM | POA: Diagnosis present

## 2019-10-10 DIAGNOSIS — I160 Hypertensive urgency: Secondary | ICD-10-CM | POA: Diagnosis not present

## 2019-10-10 DIAGNOSIS — F03918 Unspecified dementia, unspecified severity, with other behavioral disturbance: Secondary | ICD-10-CM | POA: Diagnosis present

## 2019-10-10 DIAGNOSIS — Z809 Family history of malignant neoplasm, unspecified: Secondary | ICD-10-CM

## 2019-10-10 LAB — URINALYSIS, COMPLETE (UACMP) WITH MICROSCOPIC
Bilirubin Urine: NEGATIVE
Glucose, UA: NEGATIVE mg/dL
Hgb urine dipstick: NEGATIVE
Ketones, ur: NEGATIVE mg/dL
Nitrite: POSITIVE — AB
Protein, ur: NEGATIVE mg/dL
Specific Gravity, Urine: 1.023 (ref 1.005–1.030)
Squamous Epithelial / HPF: NONE SEEN (ref 0–5)
pH: 5 (ref 5.0–8.0)

## 2019-10-10 LAB — CBC
HCT: 35.6 % — ABNORMAL LOW (ref 36.0–46.0)
Hemoglobin: 11.5 g/dL — ABNORMAL LOW (ref 12.0–15.0)
MCH: 29.9 pg (ref 26.0–34.0)
MCHC: 32.3 g/dL (ref 30.0–36.0)
MCV: 92.5 fL (ref 80.0–100.0)
Platelets: 281 10*3/uL (ref 150–400)
RBC: 3.85 MIL/uL — ABNORMAL LOW (ref 3.87–5.11)
RDW: 13.2 % (ref 11.5–15.5)
WBC: 6.8 10*3/uL (ref 4.0–10.5)
nRBC: 0 % (ref 0.0–0.2)

## 2019-10-10 LAB — BASIC METABOLIC PANEL
Anion gap: 8 (ref 5–15)
BUN: 36 mg/dL — ABNORMAL HIGH (ref 8–23)
CO2: 25 mmol/L (ref 22–32)
Calcium: 8.9 mg/dL (ref 8.9–10.3)
Chloride: 112 mmol/L — ABNORMAL HIGH (ref 98–111)
Creatinine, Ser: 0.97 mg/dL (ref 0.44–1.00)
GFR calc Af Amer: >60 mL/min (ref >60)
GFR calc non Af Amer: 53 mL/min — ABNORMAL LOW (ref >60)
Glucose, Bld: 112 mg/dL — ABNORMAL HIGH (ref 70–99)
Potassium: 5 mmol/L (ref 3.5–5.1)
Sodium: 145 mmol/L (ref 135–145)

## 2019-10-10 MED ORDER — GUAIFENESIN 100 MG/5ML PO SOLN: 200.0000 mg | Freq: Four times a day (QID) | ORAL | Status: DC | PRN

## 2019-10-10 MED ORDER — TRAZODONE HCL 50 MG PO TABS: 25.0000 mg | ORAL_TABLET | Freq: Every evening | ORAL | Status: DC | PRN

## 2019-10-10 MED ORDER — DONEPEZIL HCL 5 MG PO TABS
10.0000 mg | ORAL_TABLET | Freq: Every day | ORAL | Status: DC
  Administered 2019-10-11: 10 mg via ORAL
  Filled 2019-10-10: qty 2

## 2019-10-10 MED ORDER — ENOXAPARIN SODIUM 40 MG/0.4ML ~~LOC~~ SOLN
40.0000 mg | SUBCUTANEOUS | Status: DC
  Administered 2019-10-11 – 2019-10-12 (×2): 40 mg via SUBCUTANEOUS
  Filled 2019-10-10 (×4): qty 0.4

## 2019-10-10 MED ORDER — ONDANSETRON HCL 4 MG PO TABS: 4.0000 mg | ORAL_TABLET | Freq: Four times a day (QID) | ORAL | Status: DC | PRN

## 2019-10-10 MED ORDER — MAGNESIUM HYDROXIDE 400 MG/5ML PO SUSP: 30.0000 mL | Freq: Every day | ORAL | Status: DC | PRN

## 2019-10-10 MED ORDER — MAGNESIUM HYDROXIDE 400 MG/5ML PO SUSP
30.0000 mL | Freq: Every evening | ORAL | Status: DC | PRN
  Filled 2019-10-10: qty 30

## 2019-10-10 MED ORDER — PANTOPRAZOLE SODIUM 40 MG PO TBEC
40.0000 mg | DELAYED_RELEASE_TABLET | Freq: Every day | ORAL | Status: DC
  Filled 2019-10-10: qty 1

## 2019-10-10 MED ORDER — SODIUM CHLORIDE 0.9 % IV SOLN: INTRAVENOUS | Status: DC

## 2019-10-10 MED ORDER — KETOCONAZOLE 2 % EX SHAM
1.0000 "application " | MEDICATED_SHAMPOO | CUTANEOUS | Status: DC
  Filled 2019-10-10 (×2): qty 120

## 2019-10-10 MED ORDER — HALOPERIDOL LACTATE 5 MG/ML IJ SOLN: 1.0000 mg | Freq: Four times a day (QID) | INTRAMUSCULAR | Status: DC | PRN

## 2019-10-10 MED ORDER — ALUM & MAG HYDROXIDE-SIMETH 200-200-20 MG/5ML PO SUSP: 30.0000 mL | Freq: Four times a day (QID) | ORAL | Status: DC | PRN

## 2019-10-10 MED ORDER — ACETAMINOPHEN 650 MG RE SUPP: 650.0000 mg | Freq: Four times a day (QID) | RECTAL | Status: DC | PRN

## 2019-10-10 MED ORDER — ACETAMINOPHEN 325 MG PO TABS: 650.0000 mg | ORAL_TABLET | Freq: Four times a day (QID) | ORAL | Status: DC | PRN

## 2019-10-10 MED ORDER — ONDANSETRON HCL 4 MG/2ML IJ SOLN: 4.0000 mg | Freq: Four times a day (QID) | INTRAMUSCULAR | Status: DC | PRN

## 2019-10-10 MED ORDER — SODIUM CHLORIDE 0.9 % IV SOLN
1.0000 g | INTRAVENOUS | Status: DC
  Administered 2019-10-11 – 2019-10-12 (×3): 1 g via INTRAVENOUS
  Filled 2019-10-10 (×3): qty 1
  Filled 2019-10-10: qty 10

## 2019-10-10 NOTE — ED Provider Notes (Signed)
[facility] Emergency Department Provider Note   ____________________________________________   First MD Initiated Contact with Patient 10/10/19 1833     (approximate)  I have reviewed the triage vital signs and the nursing notes.   HISTORY  Chief Complaint Fall EM caveat: Dementia, altered mental status, patient currently nonverbal   HPI Sheila West is a 86 y.o. female here for evaluation after of unwitnessed fall  Patient was noted to evidently have fallen.  Situation cause unknown.  Was noted to be incontinent.  No noted history of seizure.  No noted injuries.  Patient does have a history of falls.    Past Medical History:  Diagnosis Date  . Anemia   . Arthritis    "left shoulder; left hip" (02/13/2015)  . Asthma   . GERD (gastroesophageal reflux disease)   . Heart murmur dx'd 02/13/2015  . Hypercholesterolemia   . Hypertension   . Intracranial meningioma ([place])   . Melanoma ([place])    "I've had multile melanomas removed"  . Memory loss     Patient Active Problem List   Diagnosis Date Noted  . Acute metabolic encephalopathy [id number]  . CKD (chronic kidney disease), stage IIIa 07/17/2019  . Normocytic anemia 07/17/2019  . UTI (urinary tract infection) 07/17/2019  . Asthma   . Acute diverticulitis 11/30/2016  . AKI (acute kidney injury) ([place]) 11/30/2016  . Memory loss   . Anemia due to other cause   . GERD (gastroesophageal reflux disease) 02/13/2015  . Meningioma ([place]) 02/13/2015  . Leg swelling 02/13/2015  . Dementia without behavioral disturbance ([place]) 02/13/2015  . Iron deficiency anemia due to chronic blood loss 02/13/2015  . Melena 02/13/2015  . Chest pain 01/27/2014  . Hyperlipidemia 01/27/2014  . Hypertension     Past Surgical History:  Procedure Laterality Date  . APPENDECTOMY    . COLONOSCOPY N/A 02/15/2015   Procedure: COLONOSCOPY;  Surgeon: Manus Gunning, MD;  Location: [facility];  Service:  Gastroenterology;  Laterality: N/A;  . ESOPHAGOGASTRODUODENOSCOPY N/A 02/14/2015   Procedure: ESOPHAGOGASTRODUODENOSCOPY (EGD);  Surgeon: Manus Gunning, MD;  Location: [place];  Service: Gastroenterology;  Laterality: N/A;  . GIVENS CAPSULE STUDY N/A 02/17/2015   Procedure: GIVENS CAPSULE STUDY;  Surgeon: Manus Gunning, MD;  Location: [facility];  Service: Gastroenterology;  Laterality: N/A;  . JOINT REPLACEMENT    . REPLACEMENT TOTAL KNEE Right   . TOTAL ABDOMINAL HYSTERECTOMY      Prior to Admission medications   Medication Sig Start Date End Date Taking? Authorizing Provider  acetaminophen (TYLENOL) 500 MG tablet Take 500 mg by mouth every 4 (four) hours as needed for mild pain or fever.   Yes [provider]  alum & mag hydroxide-simeth (MINTOX) 200-200-20 MG/5ML suspension Take 30 mLs by mouth 4 (four) times daily as needed for indigestion or heartburn.   Yes [provider]  donepezil (ARICEPT) 10 MG tablet Take 1 tablet (10 mg total) by mouth at bedtime. 09/17/18  Yes Marcial Pacas, MD  guaiFENesin (ROBITUSSIN) 100 MG/5ML liquid Take 200 mg by mouth every 6 (six) hours as needed for cough.   Yes [provider]  ketoconazole (NIZORAL) 2 % shampoo Apply 1 application topically 2 (two) times a week.   Yes [provider]  loperamide (IMODIUM) 2 MG capsule Take 2 mg by mouth as needed for diarrhea or loose stools. (max 8 doses in 24 hours)   Yes [provider]  magnesium hydroxide (MILK OF MAGNESIA) 400  MG/5ML suspension Take 30 mLs by mouth at bedtime as needed for mild constipation.   Yes [provider]  meloxicam (MOBIC) 7.5 MG tablet Take 7.5 mg by mouth daily.   Yes [provider]  neomycin-bacitracin-polymyxin (NEOSPORIN) ointment Apply 1 application topically as needed for wound care.   Yes [provider]  OLANZapine (ZYPREXA) 5 MG tablet Take 7.5 mg by mouth daily with supper.   Yes  [provider]  omeprazole (PRILOSEC) 20 MG capsule Take 20 mg by mouth daily.   Yes [provider]    Allergies Patient has no known allergies.  Family History  Problem Relation Age of Onset  . Cancer Sister   . Cancer Sister   . Dementia Sister     Social History Social History   Tobacco Use  . Smoking status: Never Smoker  . Smokeless tobacco: Never Used  Substance Use Topics  . Alcohol use: No  . Drug use: No    Review of Systems  EM caveat  Left a phone message HIPAA compliant with [name] voicemail requesting him to call back the ER at 6:35 PM   ____________________________________________   PHYSICAL EXAM:  VITAL SIGNS: ED Triage Vitals  Enc Vitals Group     BP 10/10/19 1832 (!) 177/83     Pulse Rate 10/10/19 1832 100     Resp 10/10/19 1832 17     Temp 10/10/19 1832 98.8 F (37.1 C)     Temp Source 10/10/19 1832 Axillary     SpO2 10/10/19 1832 98 %     Weight 10/10/19 1828 132 lb (59.9 kg)     Height 10/10/19 1828 5\' 5"  (1.651 m)     Head Circumference --      Peak Flow --      Pain Score --      Pain Loc --      Pain Edu? --      Excl. in [place]? --     Constitutional: Alert and unable to assess orientation.  Patient nonverbal.  Resting pleasantly, slight tremulousness in both upper extremities Eyes: Conjunctivae are normal. Head: Atraumatic.  He soft mass is noted just lateral of the left mandible, appears to be parotid in nature and review of documentation previous charting indicates this is chronic. Nose: No congestion/rhinnorhea. Mouth/Throat: Mucous membranes are dry. Neck: No stridor.  Cardiovascular: Normal rate, regular rhythm. Grossly normal heart sounds.  Good peripheral circulation. Respiratory: Normal respiratory effort.  No retractions. Lungs CTAB. Gastrointestinal: Soft and nontender. No distention. Musculoskeletal: No lower extremity tenderness nor edema.  Able to range of motion through both hip joints and  lower extremities without pain or discomfort.  No injuries to noted.  Upper extremities some limited mobility with regard to abduction of both shoulders, but no injuries or pain to palpation of the long bones of upper extremities or lower extremities bilateral.  No hip shortening.  No rotation. Neurologic: Will perhaps say 1 word possibly 2, somewhat difficult to discern.  No facial droop.  No focal weakness noted.  Mild slight tremulousness or minimal rigidity noted in the upper extremities bilaterally.  Moves all extremities though does not have notable deficits Skin:  Skin is warm, dry and intact. No rash noted. Psychiatric: Mood and affect are flat  ____________________________________________   LABS (all labs ordered are listed, but only abnormal results are displayed)  Labs Reviewed  CBC - Abnormal; Notable for the following components:      Result Value  RBC 3.85 (*)    Hemoglobin 11.5 (*)    HCT 35.6 (*)    All other components within normal limits  BASIC METABOLIC PANEL - Abnormal; Notable for the following components:   Chloride 112 (*)    Glucose, Bld 112 (*)    BUN 36 (*)    GFR calc non Af Amer 53 (*)    All other components within normal limits  URINALYSIS, COMPLETE (UACMP) WITH MICROSCOPIC - Abnormal; Notable for the following components:   Color, Urine YELLOW (*)    APPearance HAZY (*)    Nitrite POSITIVE (*)    Leukocytes,Ua TRACE (*)    Bacteria, UA MANY (*)    All other components within normal limits  URINE CULTURE  BASIC METABOLIC PANEL  CBC   ____________________________________________  EKG  Reviewed enterotomy at 1835 Heart rate 109 QRS 100 QTc 440 Normal sinus rhythm, no evidence of acute ischemia ____________________________________________  RADIOLOGY  CT Head Wo Contrast  Result Date: 10/10/2019 CLINICAL DATA:  Head trauma, headache EXAM: CT HEAD WITHOUT CONTRAST TECHNIQUE: Contiguous axial images were obtained from the base of the skull  through the vertex without intravenous contrast. COMPARISON:  09/20/2019 FINDINGS: Brain: No acute infarct or hemorrhage. The lateral ventricles and midline structures are stable. Basal ganglia calcifications again noted. No acute extra-axial fluid collections. Calcified mass in the right frontal region consistent with meningioma. No mass effect. Vascular: No hyperdense vessel or unexpected calcification. Skull: Normal. Negative for fracture or focal lesion. Sinuses/Orbits: No acute finding. Other: The left parotid mass seen on prior CT is not as well appreciated on this exam due to slice selection. IMPRESSION: 1. Stable calcified right frontal meningioma without mass effect. 2. No acute intracranial process. 3. Please refer to prior CT face report 09/02/2019 describing a left parotid mass requiring nonemergent follow-up with CT neck with contrast. Electronically Signed   By: Randa Ngo M.D.   On: 10/10/2019 19:27   CT Cervical Spine Wo Contrast  Result Date: 10/10/2019 CLINICAL DATA:  Un witnessed fall, headache, dimension EXAM: CT CERVICAL SPINE WITHOUT CONTRAST TECHNIQUE: Multidetector CT imaging of the cervical spine was performed without intravenous contrast. Multiplanar CT image reconstructions were also generated. COMPARISON:  09/20/2019 FINDINGS: Alignment: Grossly normal anatomic alignment. Skull base and vertebrae: No acute displaced fractures. Soft tissues and spinal canal: No prevertebral fluid or swelling. No visible canal hematoma. Cystic structures within the left parotid gland are again identified, unchanged. Please refer to prior CT discussion recommending CT neck with contrast on a nonemergent basis for complete characterization. Disc levels: There is diffuse cervical spondylosis and facet hypertrophy most pronounced from C3 through C6. There is symmetrical neural foraminal narrowing at C3-4, C4-5, and C5-6. Upper chest: Airway is patent. Lung apices are clear. Other: Reconstructed images  demonstrate no additional findings. IMPRESSION: 1. No acute cervical spine fracture. 2. Diffuse cervical spondylosis and facet hypertrophy. 3. Stable cystic structures within the left parotid gland. Please refer to prior CT discussion recommending CT neck with contrast on a nonemergent basis for complete characterization. Electronically Signed   By: Randa Ngo M.D.   On: 10/10/2019 19:30   DG Chest Portable 1 View  Result Date: 10/10/2019 CLINICAL DATA:  86 year old female with altered sensory. EXAM: PORTABLE CHEST 1 VIEW COMPARISON:  Chest radiograph dated 07/17/2019. FINDINGS: No focal consolidation, pleural effusion or pneumothorax. Probable right lung base atelectasis. Calcified granuloma in the left upper lung field the cardiac silhouette is within limits. Atherosclerotic calcification of the aorta. Degenerative  changes of the spine and shoulders. No acute osseous pathology. IMPRESSION: No active cardiopulmonary disease. Electronically Signed   By: Anner Crete M.D.   On: 10/10/2019 19:38    Chest x-ray negative for acute.  CT the head negative for acute findings though again noted is her known meningioma and parotid mass.  No cervical fractures. ____________________________________________   PROCEDURES  Procedure(s) performed: None  Procedures  Critical Care performed: No  ____________________________________________   INITIAL IMPRESSION / ASSESSMENT AND PLAN / ED COURSE  Pertinent labs & imaging results that were available during my care of the patient were reviewed by me and considered in my medical decision making (see chart for details).   Unwitnessed fall.  Etiology unclear.  Previous history seems to indicate history of encephalopathy with falls and UTI in the past.  Was seen a couple weeks ago in the ER by Dr. Charna Archer at which point she was much more conversant, seems to have a change in mental status on top of her existing dementia.  Will work-up including imaging  studies, await callback from her listed next of kin [name].  Continue to work-up, obtain urinalysis, labs, EKG head CT.  No noted acute respiratory symptoms  Clinical Course as of Oct 10 40  Sun Oct 10, 2019  1851 Spoke with [name] (her power of attorney). Typically can at least converse some. Has been unable to see her recently.    [MQ]  2000 Consultation placed with pharmacist regarding previous urine culture. Appears that she has a UTI once again with similar presentation as encephalopathy, however previously had VRE   [MQ]    Clinical Course User Index [MQ] Delman Kitten, MD   Patient admitted to hospitalist service Dr. Sidney Ace   ____________________________________________   FINAL CLINICAL IMPRESSION(S) / ED DIAGNOSES  Final diagnoses:  Fall, initial encounter  Delirium  Urinary tract infection, acute        Note:  This document was prepared using Dragon voice recognition software and may include unintentional dictation errors       Delman Kitten, MD 10/11/19 [phone]

## 2019-10-10 NOTE — H&P (Addendum)
[facility] [place] NAME: Sheila West    MR#:  [id number]  DATE OF BIRTH:  1929-10-10  DATE OF ADMISSION:  10/10/2019  PRIMARY CARE PHYSICIAN: [facility]   REQUESTING/REFERRING PHYSICIAN: Delman Kitten, MD  CHIEF COMPLAINT:   Chief Complaint  Patient presents with  . Fall    HISTORY OF PRESENT ILLNESS:  Sheila West  is a 86 y.o. Caucasian female with a known history of hypertension, dyslipidemia, asthma and dementia who presented to the emergency room with acute onset of altered mental status with decreased responsiveness.  No reported fever or chills.  She has been incontinent.  No other history could be obtained from the patient as she was almost nonverbal.  Upon presentation to the emergency room, blood pressure was 177/83 with otherwise normal vital signs.  Labs revealed potassium of 5 with otherwise unremarkable BMP.  CBC showed mild anemia.  Urinalysis showed positive nitrite and many bacteria.  Urine culture was drawn.  Chest x-ray showed no acute cardiopulmonary disease.  Noncontrasted head CT scan revealed stable calcified frontal meningioma without mass-effect with no acute intracranial process.  The patient had a previous left parotid mass that will need follow-up neck CT. C-spine CT showed diffuse cervical spondylosis and facet hypertrophy and stable cystic structures within the left parotid gland with no acute C-spine fracture. EKG showed normal sinus rhythm with rate of 98 with poor R wave progression.  The patient will be admitted to a medically monitored bed for further evaluation and management.   PAST MEDICAL HISTORY:   Past Medical History:  Diagnosis Date  . Anemia   . Arthritis    "left shoulder; left hip" (02/13/2015)  . Asthma   . GERD (gastroesophageal reflux disease)   . Heart murmur dx'd 02/13/2015  . Hypercholesterolemia   . Hypertension   . Intracranial meningioma ([place])   . Melanoma ([place])    "I've had  multile melanomas removed"  . Memory loss   Dementia.  PAST SURGICAL HISTORY:   Past Surgical History:  Procedure Laterality Date  . APPENDECTOMY    . COLONOSCOPY N/A 02/15/2015   Procedure: COLONOSCOPY;  Surgeon: Manus Gunning, MD;  Location[facility] ENDOSCOPY;  Service: Gastroenterology;  Laterality: N/A;  . ESOPHAGOGASTRODUODENOSCOPY N/A 02/14/2015   Procedure: ESOPHAGOGASTRODUODENOSCOPY (EGD);  Surgeon: Manus Gunning, MD;  Location: [facility];  Service: Gastroenterology;  Laterality: N/A;  . GIVENS CAPSULE STUDY N/A 02/17/2015   Procedure: GIVENS CAPSULE STUDY;  Surgeon: Manus Gunning, MD;  Location: [facility];  Service: Gastroenterology;  Laterality: N/A;  . JOINT REPLACEMENT    . REPLACEMENT TOTAL KNEE Right   . TOTAL ABDOMINAL HYSTERECTOMY      SOCIAL HISTORY:   Social History   Tobacco Use  . Smoking status: Never Smoker  . Smokeless tobacco: Never Used  Substance Use Topics  . Alcohol use: No    FAMILY HISTORY:   Family History  Problem Relation Age of Onset  . Cancer Sister   . Cancer Sister   . Dementia Sister     DRUG ALLERGIES:  No Known Allergies  REVIEW OF SYSTEMS:   ROS As per history of present illness. All pertinent systems were reviewed above. Constitutional,  HEENT, cardiovascular, respiratory, GI, GU, musculoskeletal, neuro, psychiatric, endocrine,  integumentary and hematologic systems were reviewed and are otherwise  negative/unremarkable except for positive findings mentioned above in the HPI.   MEDICATIONS AT HOME:   Prior to Admission medications   Medication  Sig Start Date End Date Taking? Authorizing Provider  acetaminophen (TYLENOL) 500 MG tablet Take 500 mg by mouth every 4 (four) hours as needed for mild pain or fever.   Yes [provider]  alum & mag hydroxide-simeth (MINTOX) 200-200-20 MG/5ML suspension Take 30 mLs by mouth 4 (four) times daily as needed for indigestion or heartburn.   Yes  [provider]  donepezil (ARICEPT) 10 MG tablet Take 1 tablet (10 mg total) by mouth at bedtime. 09/17/18  Yes Marcial Pacas, MD  guaiFENesin (ROBITUSSIN) 100 MG/5ML liquid Take 200 mg by mouth every 6 (six) hours as needed for cough.   Yes [provider]  ketoconazole (NIZORAL) 2 % shampoo Apply 1 application topically 2 (two) times a week.   Yes [provider]  loperamide (IMODIUM) 2 MG capsule Take 2 mg by mouth as needed for diarrhea or loose stools. (max 8 doses in 24 hours)   Yes [provider]  magnesium hydroxide (MILK OF MAGNESIA) 400 MG/5ML suspension Take 30 mLs by mouth at bedtime as needed for mild constipation.   Yes [provider]  meloxicam (MOBIC) 7.5 MG tablet Take 7.5 mg by mouth daily.   Yes [provider]  neomycin-bacitracin-polymyxin (NEOSPORIN) ointment Apply 1 application topically as needed for wound care.   Yes [provider]  OLANZapine (ZYPREXA) 5 MG tablet Take 7.5 mg by mouth daily with supper.   Yes [provider]  omeprazole (PRILOSEC) 20 MG capsule Take 20 mg by mouth daily.   Yes [provider]      VITAL SIGNS:  Blood pressure (!) 164/85, pulse 91, temperature 98.8 F (37.1 C), temperature source Axillary, resp. rate 16, height 5\' 5"  (1.651 m), weight 59.9 kg, SpO2 100 %.  PHYSICAL EXAMINATION:  Physical Exam  GENERAL:  86 y.o.-year-old Caucasian female patient lying in the bed with no acute distress.  She is fairly somnolent but would occasionally open her eyes to her name. EYES: Pupils equal, round, reactive to light and accommodation. No scleral icterus. Extraocular muscles intact.  HEENT: Head atraumatic, normocephalic. Oropharynx and nasopharynx clear.  NECK:  Supple, no jugular venous distention. No thyroid enlargement, no tenderness.  LUNGS: Normal breath sounds bilaterally, no wheezing, rales,rhonchi or crepitation. No use of accessory muscles of respiration.   CARDIOVASCULAR: Regular rate and rhythm, S1, S2 normal. No murmurs, rubs, or gallops.  ABDOMEN: Soft, nondistended, nontender. Bowel sounds present. No organomegaly or mass.  EXTREMITIES: No pedal edema, cyanosis, or clubbing.  NEUROLOGIC: Cranial nerves II through XII are intact. Muscle strength 5/5 in all extremities. Sensation intact. Gait not checked.  PSYCHIATRIC: The patient is alert and oriented x 3.  Normal affect and good eye contact. SKIN: No obvious rash, lesion, or ulcer.   LABORATORY PANEL:   CBC Recent Labs  Lab 10/10/19 1849  WBC 6.8  HGB 11.5*  HCT 35.6*  PLT 281   ------------------------------------------------------------------------------------------------------------------  Chemistries  Recent Labs  Lab 10/10/19 1849  NA 145  K 5.0  CL 112*  CO2 25  GLUCOSE 112*  BUN 36*  CREATININE 0.97  CALCIUM 8.9   ------------------------------------------------------------------------------------------------------------------  Cardiac Enzymes No results for input(s): TROPONINI in the last 168 hours. ------------------------------------------------------------------------------------------------------------------  RADIOLOGY:  CT Head Wo Contrast  Result Date: 10/10/2019 CLINICAL DATA:  Head trauma, headache EXAM: CT HEAD WITHOUT CONTRAST TECHNIQUE: Contiguous axial images were obtained from the base of the skull through the vertex without intravenous contrast. COMPARISON:  09/20/2019 FINDINGS: Brain: No acute infarct or  hemorrhage. The lateral ventricles and midline structures are stable. Basal ganglia calcifications again noted. No acute extra-axial fluid collections. Calcified mass in the right frontal region consistent with meningioma. No mass effect. Vascular: No hyperdense vessel or unexpected calcification. Skull: Normal. Negative for fracture or focal lesion. Sinuses/Orbits: No acute finding. Other: The left parotid mass seen on prior CT is not as well  appreciated on this exam due to slice selection. IMPRESSION: 1. Stable calcified right frontal meningioma without mass effect. 2. No acute intracranial process. 3. Please refer to prior CT face report 09/02/2019 describing a left parotid mass requiring nonemergent follow-up with CT neck with contrast. Electronically Signed   By: Randa Ngo M.D.   On: 10/10/2019 19:27   CT Cervical Spine Wo Contrast  Result Date: 10/10/2019 CLINICAL DATA:  Un witnessed fall, headache, dimension EXAM: CT CERVICAL SPINE WITHOUT CONTRAST TECHNIQUE: Multidetector CT imaging of the cervical spine was performed without intravenous contrast. Multiplanar CT image reconstructions were also generated. COMPARISON:  09/20/2019 FINDINGS: Alignment: Grossly normal anatomic alignment. Skull base and vertebrae: No acute displaced fractures. Soft tissues and spinal canal: No prevertebral fluid or swelling. No visible canal hematoma. Cystic structures within the left parotid gland are again identified, unchanged. Please refer to prior CT discussion recommending CT neck with contrast on a nonemergent basis for complete characterization. Disc levels: There is diffuse cervical spondylosis and facet hypertrophy most pronounced from C3 through C6. There is symmetrical neural foraminal narrowing at C3-4, C4-5, and C5-6. Upper chest: Airway is patent. Lung apices are clear. Other: Reconstructed images demonstrate no additional findings. IMPRESSION: 1. No acute cervical spine fracture. 2. Diffuse cervical spondylosis and facet hypertrophy. 3. Stable cystic structures within the left parotid gland. Please refer to prior CT discussion recommending CT neck with contrast on a nonemergent basis for complete characterization. Electronically Signed   By: Randa Ngo M.D.   On: 10/10/2019 19:30   DG Chest Portable 1 View  Result Date: 10/10/2019 CLINICAL DATA:  86 year old female with altered sensory. EXAM: PORTABLE CHEST 1 VIEW COMPARISON:  Chest  radiograph dated 07/17/2019. FINDINGS: No focal consolidation, pleural effusion or pneumothorax. Probable right lung base atelectasis. Calcified granuloma in the left upper lung field the cardiac silhouette is within limits. Atherosclerotic calcification of the aorta. Degenerative changes of the spine and shoulders. No acute osseous pathology. IMPRESSION: No active cardiopulmonary disease. Electronically Signed   By: Anner Crete M.D.   On: 10/10/2019 19:38      IMPRESSION AND PLAN:   1.  Acute metabolic encephalopathy likely secondary to UTI. -The patient was admitted to a medically monitored bed. -We will follow neuro checks every 4 hours for 24 hours. -We will place her on IV Rocephin. -We will follow urine culture and sensitivity.  She grew VRE in previous urine culture.  We will hold off linezolid at this time though.  She has no significant pyuria or leukocytosis or fever. -We will hold off sedatives.  2.  Dementia with behavioral disturbances. -We will continue Aricept and replace scheduled Zyprexa with as needed IM Haldol for now.  3..  Hypertensive urgency. -The patient will be placed on as needed IV labetalol.  We will start her on p.o. Norvasc.  4.  DVT prophylaxis -Subcutaneous Lovenox.  All the records are reviewed and case discussed with ED provider. The plan of care was discussed in details with the patient (and family). I answered all questions. The patient agreed to proceed with the above mentioned plan. Further management will  depend upon hospital course.   CODE STATUS: The patient is DNR/DNI.  Status is: Inpatient  Remains inpatient appropriate because:Altered mental status, Unsafe d/c plan, IV treatments appropriate due to intensity of illness or inability to take PO and Inpatient level of care appropriate due to severity of illness given significant clear altered mental status, need for IV antibiotics, fluids and follow-up on urine culture.   Dispo: The  patient is from: ALF              Anticipated d/c is to: ALF              Anticipated d/c date is: 2 days              Patient currently is not medically stable to d/c.    TOTAL TIME TAKING CARE OF THIS PATIENT: 50 minutes.    Christel Mormon M.D on 10/10/2019 at 10:52 PM  [facility]   From 7 PM-7 AM, contact night-coverage [email or web address]  CC: Primary care physician; [facility]   Note: This dictation was prepared with Dragon dictation along with smaller phrase technology. Any transcriptional errors that result from this process are unintentional.

## 2019-10-10 NOTE — ED Notes (Signed)
No meds verified att in pyxis, none in med area

## 2019-10-10 NOTE — ED Triage Notes (Signed)
Pt presents from [facility] via [facility] with c/o unwitnessed fall. pt has hx of advanced dementia. Unknown LOC. Pt currently alert but confused.

## 2019-10-10 NOTE — ED Notes (Signed)
This RN attempted to call report at this time.  

## 2019-10-11 ENCOUNTER — Inpatient Hospital Stay: Payer: Medicare HMO

## 2019-10-11 ENCOUNTER — Encounter: Payer: Self-pay | Admitting: Family Medicine

## 2019-10-11 DIAGNOSIS — R1310 Dysphagia, unspecified: Secondary | ICD-10-CM

## 2019-10-11 DIAGNOSIS — N3000 Acute cystitis without hematuria: Secondary | ICD-10-CM

## 2019-10-11 LAB — CBC
HCT: 32.3 % — ABNORMAL LOW (ref 36.0–46.0)
Hemoglobin: 10.5 g/dL — ABNORMAL LOW (ref 12.0–15.0)
MCH: 29.4 pg (ref 26.0–34.0)
MCHC: 32.5 g/dL (ref 30.0–36.0)
MCV: 90.5 fL (ref 80.0–100.0)
Platelets: 273 10*3/uL (ref 150–400)
RBC: 3.57 MIL/uL — ABNORMAL LOW (ref 3.87–5.11)
RDW: 13.2 % (ref 11.5–15.5)
WBC: 5.6 10*3/uL (ref 4.0–10.5)
nRBC: 0 % (ref 0.0–0.2)

## 2019-10-11 LAB — BASIC METABOLIC PANEL WITH GFR
Anion gap: 6 (ref 5–15)
BUN: 31 mg/dL — ABNORMAL HIGH (ref 8–23)
CO2: 25 mmol/L (ref 22–32)
Calcium: 8.3 mg/dL — ABNORMAL LOW (ref 8.9–10.3)
Chloride: 112 mmol/L — ABNORMAL HIGH (ref 98–111)
Creatinine, Ser: 0.87 mg/dL (ref 0.44–1.00)
GFR calc Af Amer: >60 mL/min
GFR calc non Af Amer: 60 mL/min — ABNORMAL LOW
Glucose, Bld: 91 mg/dL (ref 70–99)
Potassium: 4 mmol/L (ref 3.5–5.1)
Sodium: 143 mmol/L (ref 135–145)

## 2019-10-11 LAB — SARS CORONAVIRUS 2 BY RT PCR (HOSPITAL ORDER, PERFORMED IN ~~LOC~~ HOSPITAL LAB): SARS Coronavirus 2: NEGATIVE

## 2019-10-11 MED ORDER — SODIUM CHLORIDE 0.9% FLUSH: 10.0000 mL | INTRAVENOUS | Status: DC | PRN

## 2019-10-11 MED ORDER — SODIUM CHLORIDE 0.9% FLUSH
10.0000 mL | Freq: Two times a day (BID) | INTRAVENOUS | Status: DC
  Administered 2019-10-11 – 2019-10-13 (×5): 10 mL

## 2019-10-11 MED ORDER — LABETALOL HCL 5 MG/ML IV SOLN: 20.0000 mg | INTRAVENOUS | Status: DC | PRN

## 2019-10-11 MED ORDER — AMLODIPINE BESYLATE 5 MG PO TABS
5.0000 mg | ORAL_TABLET | Freq: Every day | ORAL | Status: DC
  Filled 2019-10-11: qty 1

## 2019-10-11 MED ORDER — IOHEXOL 300 MG/ML  SOLN
75.0000 mL | Freq: Once | INTRAMUSCULAR | Status: AC | PRN
  Administered 2019-10-11: 75 mL via INTRAVENOUS

## 2019-10-11 MED ORDER — OLANZAPINE 7.5 MG PO TABS
7.5000 mg | ORAL_TABLET | Freq: Every day | ORAL | Status: DC
  Administered 2019-10-11: 7.5 mg via ORAL
  Filled 2019-10-11 (×3): qty 1

## 2019-10-11 NOTE — Progress Notes (Addendum)
RN spoke to Chartered certified accountant at [employer]. Tech was able to describe pt's baseline but not able to answer all admission questions. Per report, garbled speech and orientation 0-1 is baseline.

## 2019-10-11 NOTE — Progress Notes (Signed)
POA requesting update from MD. MD made aware. Madlyn Frankel, RN

## 2019-10-11 NOTE — Progress Notes (Signed)
PROGRESS NOTE    ZOEYANN GRAPER   [id number]  DOB: 1930/04/08  PCP: Orvis Br[facility]    DOA: 10/10/2019 LOS: 1   Brief Narrative   Sheila West  is a 86 y.o. Caucasian female with a history of hypertension, dyslipidemia, asthma, chronic parotid gland enlargement, and dementia who presented to the emergency room with altered mental status with decreased responsiveness.  In the ED, hypertensive 177/83 otherwise normal vitals.  Labs notable for mild anemia, normal BMP.  UA was consistent wit infection, sent for culture.  Chest x-ray showed no acute cardiopulmonary disease.  Noncontrasted head CT scan revealed stable calcified frontal meningioma without mass-effect with no acute intracranial process.  The patient had a previous left parotid mass that will need follow-up neck CT. C-spine CT showed diffuse cervical spondylosis and facet hypertrophy and stable cystic structures within the left parotid gland with no acute C-spine fracture. EKG showed normal sinus rhythm with rate of 98 with poor R wave progression.  Admitted to hospitalist service for further evaluation and management.  Being treated with IV antibiotics for UTI.     Assessment & Plan   Principal Problem:   UTI (urinary tract infection) Active Problems:   Acute metabolic encephalopathy   Dysphagia   Hypertension   Hyperlipidemia   GERD (gastroesophageal reflux disease)   Meningioma (HCC)   Dementia with behavioral disturbance (HCC)  UTI (urinary tract infection) - present on admission. --Continue empiric Rocephin pending culture --follow urine culture --stop IV fluids  Acute metabolic encephalopathy - present on admission.  Likely due to UTI in setting of dementia.  Mentation appears to be at baseline based on reports from her SNF staff. --mgmt as above --neurochecks --monitor --avoid sedating medications  Dysphagia - evaluated by SLP.  Unknown if new or not.  Suspect parotid mass is compressing  nerves --Dysphagia I (pureed) diet with nectar thick liquids recommended --Aspiration precautions --Support and monitor with feeding  Parotid gland mass / enlargement - chronic.  Her left facial droop which includes her forehead, and her dysphagia, could be due to compression of CN's V and VII.  POA reported CT of neck had been planned but not done yet.   --CT soft tissue neck w contrast pending --ENT consulted  Hypertension - chronic, uncontrolled on admission with BP 177/83.   --Continue home amlodipine --PRN IV Labetalol  Hyperlipidemia - continue statin  GERD - continue PPI  Meningioma - appears stable.  Monitor as above.  Dementia with behavioral disturbance - continue home Aricept and Zyprexa.  IM Haldol PRN.   Patient BMI: Body mass index is 21.97 kg/m.   DVT prophylaxis: Lovenox  Diet:  Diet Orders (From admission, onward)    Start     Ordered   10/11/19 1202  DIET - DYS 1 Room service appropriate? Yes with Assist; Fluid consistency: Nectar Thick  Diet effective now    Comments: Extra Gravy on meats, potatoes. No Straws. Yogurt TID meals, puddings.  Question Answer Comment  Room service appropriate? Yes with Assist   Fluid consistency: Nectar Thick      10/11/19 1204            Code Status: DNR    Subjective 10/11/19    Patient seen during swallow evaluation this AM.  She is unable to provide history, but denies chest pain, SOB, fever/chills or other complaints.     Disposition Plan & Communication   Status is: Inpatient  Remains inpatient appropriate because:IV treatments appropriate due to  intensity of illness or inability to take PO   Dispo: The patient is from: SNF              Anticipated d/c is to: SNF              Anticipated d/c date is: 2 days              Patient currently is not medically stable to d/c.   Family Communication: none at bedside during encounter. Spoke with patient's POA, [name], this afternoon.  Updated on plan and  all questions answered.     Consults, Procedures, Significant Events   Consultants:   None  Procedures:   None  Antimicrobials:   Rocephin     Objective   Vitals:   10/10/19 2257 10/11/19 0359 10/11/19 0717 10/11/19 1200  BP: (!) 156/58 (!) 169/60 (!) 156/53 (!) 144/63  Pulse: 84 87    Resp: 18 17 13    Temp: 98.9 F (37.2 C) 97.8 F (36.6 C) 97.8 F (36.6 C)   TempSrc: Axillary Axillary Oral   SpO2: 100% 100% 97%   Weight:      Height:        Intake/Output Summary (Last 24 hours) at 10/11/2019 1522 Last data filed at 10/11/2019 0500 Gross per 24 hour  Intake 646.6 ml  Output --  Net 646.6 ml   Filed Weights   10/10/19 1828  Weight: 59.9 kg    Physical Exam:  General exam: awake, alert, no acute distress HEENT: moist mucus membranes, left facial enlargement/mass is hard but non-tender on exam Respiratory system: left-side with referred upper airway sounds otherwise clear, no wheezes, rales or rhonchi, normal respiratory effort. Cardiovascular system: normal S1/S2, RRR, no pedal edema.   Central nervous system: abnormal speech, left facial droop including forehead, no other gross focal deficits, patient unable to follow commands for full neuro exam Extremities: moves all, no edema, normal tone Psychiatry: normal mood, congruent affect  Labs   Data Reviewed: I have personally reviewed following labs and imaging studies  CBC: Recent Labs  Lab 10/10/19 1849 10/11/19 0443  WBC 6.8 5.6  HGB 11.5* 10.5*  HCT 35.6* 32.3*  MCV 92.5 90.5  PLT 281 [id number]   Basic Metabolic Panel: Recent Labs  Lab 10/10/19 1849 10/11/19 0443  NA 145 143  K 5.0 4.0  CL 112* 112*  CO2 25 25  GLUCOSE 112* 91  BUN 36* 31*  CREATININE 0.97 0.87  CALCIUM 8.9 8.3*   GFR: Estimated Creatinine Clearance: 41 mL/min (by C-G formula based on SCr of 0.87 mg/dL). Liver Function Tests: No results for input(s): AST, ALT, ALKPHOS, BILITOT, PROT, ALBUMIN in the last 168 hours. No  results for input(s): LIPASE, AMYLASE in the last 168 hours. No results for input(s): AMMONIA in the last 168 hours. Coagulation Profile: No results for input(s): INR, PROTIME in the last 168 hours. Cardiac Enzymes: No results for input(s): CKTOTAL, CKMB, CKMBINDEX, TROPONINI in the last 168 hours. BNP (last 3 results) No results for input(s): PROBNP in the last 8760 hours. HbA1C: No results for input(s): HGBA1C in the last 72 hours. CBG: No results for input(s): GLUCAP in the last 168 hours. Lipid Profile: No results for input(s): CHOL, HDL, LDLCALC, TRIG, CHOLHDL, LDLDIRECT in the last 72 hours. Thyroid Function Tests: No results for input(s): TSH, T4TOTAL, FREET4, T3FREE, THYROIDAB in the last 72 hours. Anemia Panel: No results for input(s): VITAMINB12, FOLATE, FERRITIN, TIBC, IRON, RETICCTPCT in the last 72 hours. Sepsis  Labs: No results for input(s): PROCALCITON, LATICACIDVEN in the last 168 hours.  No results found for this or any previous visit (from the past 240 hour(s)).    Imaging Studies   CT Head Wo Contrast  Result Date: 10/10/2019 CLINICAL DATA:  Head trauma, headache EXAM: CT HEAD WITHOUT CONTRAST TECHNIQUE: Contiguous axial images were obtained from the base of the skull through the vertex without intravenous contrast. COMPARISON:  09/20/2019 FINDINGS: Brain: No acute infarct or hemorrhage. The lateral ventricles and midline structures are stable. Basal ganglia calcifications again noted. No acute extra-axial fluid collections. Calcified mass in the right frontal region consistent with meningioma. No mass effect. Vascular: No hyperdense vessel or unexpected calcification. Skull: Normal. Negative for fracture or focal lesion. Sinuses/Orbits: No acute finding. Other: The left parotid mass seen on prior CT is not as well appreciated on this exam due to slice selection. IMPRESSION: 1. Stable calcified right frontal meningioma without mass effect. 2. No acute intracranial  process. 3. Please refer to prior CT face report 09/02/2019 describing a left parotid mass requiring nonemergent follow-up with CT neck with contrast. Electronically Signed   By: Randa Ngo M.D.   On: 10/10/2019 19:27   CT Cervical Spine Wo Contrast  Result Date: 10/10/2019 CLINICAL DATA:  Un witnessed fall, headache, dimension EXAM: CT CERVICAL SPINE WITHOUT CONTRAST TECHNIQUE: Multidetector CT imaging of the cervical spine was performed without intravenous contrast. Multiplanar CT image reconstructions were also generated. COMPARISON:  09/20/2019 FINDINGS: Alignment: Grossly normal anatomic alignment. Skull base and vertebrae: No acute displaced fractures. Soft tissues and spinal canal: No prevertebral fluid or swelling. No visible canal hematoma. Cystic structures within the left parotid gland are again identified, unchanged. Please refer to prior CT discussion recommending CT neck with contrast on a nonemergent basis for complete characterization. Disc levels: There is diffuse cervical spondylosis and facet hypertrophy most pronounced from C3 through C6. There is symmetrical neural foraminal narrowing at C3-4, C4-5, and C5-6. Upper chest: Airway is patent. Lung apices are clear. Other: Reconstructed images demonstrate no additional findings. IMPRESSION: 1. No acute cervical spine fracture. 2. Diffuse cervical spondylosis and facet hypertrophy. 3. Stable cystic structures within the left parotid gland. Please refer to prior CT discussion recommending CT neck with contrast on a nonemergent basis for complete characterization. Electronically Signed   By: Randa Ngo M.D.   On: 10/10/2019 19:30   DG Chest Portable 1 View  Result Date: 10/10/2019 CLINICAL DATA:  86 year old female with altered sensory. EXAM: PORTABLE CHEST 1 VIEW COMPARISON:  Chest radiograph dated 07/17/2019. FINDINGS: No focal consolidation, pleural effusion or pneumothorax. Probable right lung base atelectasis. Calcified granuloma in  the left upper lung field the cardiac silhouette is within limits. Atherosclerotic calcification of the aorta. Degenerative changes of the spine and shoulders. No acute osseous pathology. IMPRESSION: No active cardiopulmonary disease. Electronically Signed   By: Anner Crete M.D.   On: 10/10/2019 19:38     Medications   Scheduled Meds: . amLODipine  5 mg Oral Daily  . donepezil  10 mg Oral QHS  . enoxaparin (LOVENOX) injection  40 mg Subcutaneous Q24H  . ketoconazole  1 application Topical Once per day on Mon Thu  . OLANZapine  7.5 mg Oral Q supper  . pantoprazole  40 mg Oral Daily  . sodium chloride flush  10-40 mL Intracatheter Q12H   Continuous Infusions: . cefTRIAXone (ROCEPHIN)  IV Stopped (10/11/19 0153)       LOS: 1 day    Time spent:  45 minutes total with > 50% spent in coordination of care, in direct patient contact.    Ezekiel Slocumb, DO [facility]  10/11/2019, 3:22 PM    If 7PM-7AM, please contact night-coverage. How to contact the [facility] Attending or Consulting provider [place] or covering provider during after hours [place], for this patient?    1. Check the care team in [facility] and look for a) attending/consulting [facility] provider listed and b) the [facility] team listed 2. Log into [email or web address] and use [facility] universal password to access. If you do not have the password, please contact the hospital operator. 3. Locate the [facility] provider you are looking for under [facility] and page to a number that you can be directly reached. 4. If you still have difficulty reaching the provider, please page the [facility] (Director on Call) for the Hospitalists listed on amion for assistance.

## 2019-10-11 NOTE — Hospital Course (Signed)
Sheila West  is a 86 y.o. Caucasian female with a history of hypertension, dyslipidemia, asthma, chronic parotid gland enlargement, and dementia who presented to the emergency room with altered mental status with decreased responsiveness.  In the ED, hypertensive 177/83 otherwise normal vitals.  Labs notable for mild anemia, normal BMP.  UA was consistent wit infection, sent for culture.  Chest x-ray showed no acute cardiopulmonary disease.  Noncontrasted head CT scan revealed stable calcified frontal meningioma without mass-effect with no acute intracranial process.  The patient had a previous left parotid mass that will need follow-up neck CT. C-spine CT showed diffuse cervical spondylosis and facet hypertrophy and stable cystic structures within the left parotid gland with no acute C-spine fracture. EKG showed normal sinus rhythm with rate of 98 with poor R wave progression.  Admitted to hospitalist service for further evaluation and management.  Being treated with IV antibiotics for UTI.

## 2019-10-11 NOTE — Plan of Care (Signed)
Pt admitted from [facility] after unwitness fall. Pt alert but has garbled speech.  Tried to call [employer] for information on pt, but unable to reach anyone.

## 2019-10-11 NOTE — Consult Note (Signed)
Sheila West, Sheila West 1929-10-31 Sheila Slocumb, DO  Reason for Consult: Evaluate left parotid mass  HPI: The patient is an 86 year old white female who has known dementia and a calcified frontal meningioma without mass-effect.  She has underlying medical issues and has been a nursing home patient for over the last 1 year.  She was brought into the emergency room yesterday because of change in mental status.  She is noted to have a UA and is being treated for that.  Her noncontrast head CT did not show any mass-effect or intracranial process that was new.  She has evidence of rapid growth of 2 previous parotid masses that likely represent cancer.  She now has new onset of the left facial paralysis.  She has had recent dysphagia and is evaluated by speech therapy today.  She started on a nectar thick liquid diet with some pured foods, but will require assistance in feedings.  The dysphagia is felt to be secondary to her dementia.  Assessment is made of the left parotid masses evaluate these further.  Allergies: No Known Allergies  ROS: Review of systems normal other than 12 systems except per HPI.  PMH:  Past Medical History:  Diagnosis Date  . Anemia   . Arthritis    "left shoulder; left hip" (02/13/2015)  . Asthma   . GERD (gastroesophageal reflux disease)   . Heart murmur dx'd 02/13/2015  . Hypercholesterolemia   . Hypertension   . Intracranial meningioma ([place])   . Melanoma ([place])    "I've had multile melanomas removed"  . Memory loss     FH:  Family History  Problem Relation Age of Onset  . Cancer Sister   . Cancer Sister   . Dementia Sister     SH:  Social History   Socioeconomic History  . Marital status: Widowed    Spouse name: Not on file  . Number of children: 0  . Years of education: 26  . Highest education level: High school graduate  Occupational History  . Occupation: Retired  Tobacco Use  . Smoking status: Never Smoker  . Smokeless tobacco: Never  Used  Substance and Sexual Activity  . Alcohol use: No  . Drug use: No  . Sexual activity: Never  Other Topics Concern  . Not on file  Social History Narrative   Lives at home alone.   Right-handed.   2-3 cups caffeine daily.   She is cared for by her close family friends and POA, [name] and [name].  They live within 2-3 miles and check on her daily.   Social Determinants of Health   Financial Resource Strain:   . Difficulty of Paying Living Expenses:   Food Insecurity:   . Worried About Charity fundraiser in the Last Year:   . Arboriculturist in the Last Year:   Transportation Needs:   . Film/video editor (Medical):   Marland Kitchen Lack of Transportation (Non-Medical):   Physical Activity:   . Days of Exercise per Week:   . Minutes of Exercise per Session:   Stress:   . Feeling of Stress :   Social Connections:   . Frequency of Communication with Friends and Family:   . Frequency of Social Gatherings with Friends and Family:   . Attends Religious Services:   . Active Member of Clubs or Organizations:   . Attends Archivist Meetings:   Marland Kitchen Marital Status:   Intimate Partner Violence:   . Fear  of Current or Ex-Partner:   . Emotionally Abused:   Marland Kitchen Physically Abused:   . Sexually Abused:     PSH:  Past Surgical History:  Procedure Laterality Date  . APPENDECTOMY    . COLONOSCOPY N/A 02/15/2015   Procedure: COLONOSCOPY;  Surgeon: Manus Gunning, MD;  Location: [facility] ENDOSCOPY;  Service: Gastroenterology;  Laterality: N/A;  . ESOPHAGOGASTRODUODENOSCOPY N/A 02/14/2015   Procedure: ESOPHAGOGASTRODUODENOSCOPY (EGD);  Surgeon: Manus Gunning, MD;  Location: [place];  Service: Gastroenterology;  Laterality: N/A;  . GIVENS CAPSULE STUDY N/A 02/17/2015   Procedure: GIVENS CAPSULE STUDY;  Surgeon: Manus Gunning, MD;  Location: [place];  Service: Gastroenterology;  Laterality: N/A;  . JOINT REPLACEMENT    . REPLACEMENT TOTAL KNEE Right    . TOTAL ABDOMINAL HYSTERECTOMY      Physical  Exam: The patient acknowledges that on there and withdraws from noxious stimuli.  She is saying some words but they are unintelligible.  She seems to be fairly groggy.  She has weakness of the lower branches of the facial nerve with pulling of her mouth to the right side.  I do not see any obvious deformity in the upper branches around the eye or forehead but I cannot get her to voluntarily move these areas.  Nose looks open anteriorly.  She will not open her mouth wide for me.  I can see an upper denture in but no lower teeth.  Her tongue looks to be midline.  Her neck is negative for nodes or masses.  Her left cheek reveals a large firm mass that is about 5 cm in size in the mid parotid.  This is not mobile.  I do not feel any masses lowering her neck.  I reviewed the CT scan of her neck in detail.  This shows 2 adjacent masses in the left parotid gland that seem to have translucent centers.  This appears to be rapidly growing cancer with indistinct margins.  It appears to be at left deep neck node as well.  See the dictated report for further details   A/P: Shee has what appears to be 2 masses within the left parotid gland that have lucent centers and suggest rapidly growing cancer.  This is changed significantly in size since scans done earlier this year.  There is evidence to suggest metastatic disease to nodes as well.  This is now advanced and caused weakness of the left facial nerve and any surgery to try to remove this would cause permanent loss to facial nerve function.  She has severe dementia and does not seem to be oriented at all.  Surgical removal of cancer would be a major surgery that she would not likely tolerate well, and have significant debility with facial weakness and loss of function on the left side of her face.  I will discuss this with the power of attorney, [name], and explained the seriousness and poor prognosis.  A decision  will have to be made whether the mass should be aggressively treated with surgical removal in possible further treatment or whether she would be better served with hospice care.   Huey Romans 10/11/2019 6:27 PM

## 2019-10-11 NOTE — TOC Initial Note (Signed)
Transition of Care [facility]) - Initial/Assessment Note    Patient Details  Name: Sheila West MRN: [id number] Date of Birth: 1929-09-01  Transition of Care [facility]) CM/SW Contact:    Shelbie Hutching, RN Phone Number: 10/11/2019, 3:11 PM  Clinical Narrative:                 Patient admitted from [place] unit for UTI.  This RNCM called HPOA, [name], to discuss discharge planning.  RMCM introduced self and explained role in care.  [name] reports that patient has been living at [facility] for a little over a year and that she walks with a cane.  [name] would like for the patient to return to [facility] when medically cleared.    Expected Discharge Plan: Assisted Living Barriers to Discharge: Continued Medical Work up   Patient Goals and CMS Choice Patient states their goals for this hospitalization and ongoing recovery are:: patient unable to verbalize goals- advanced dementia CMS Medicare.gov Compare Post Acute Care list provided to:: Patient Represenative (must comment) Choice offered to / list presented to : [facility] POA / Helane Gunther)  Expected Discharge Plan and Services Expected Discharge Plan: Assisted Living   Discharge Planning Services: CM Consult   Living arrangements for the past 2 months: [place]                                      Prior Living Arrangements/Services Living arrangements for the past 2 months: [place] Lives with:: Facility Resident Patient language and need for interpreter reviewed:: Yes Do you feel safe going back to the place where you live?: Yes      Need for Family Participation in Patient Care: Yes (Comment) Care giver support system in place?: Yes (comment)(POA- friend) Current home services: DME(cane) Criminal Activity/Legal Involvement Pertinent to Current Situation/Hospitalization: No - Comment as needed  Activities of Daily Living Home Assistive Devices/Equipment: None ADL  Screening (condition at time of admission) Patient's cognitive ability adequate to safely complete daily activities?: No Is the patient deaf or have difficulty hearing?: No Does the patient have difficulty seeing, even when wearing glasses/contacts?: No Does the patient have difficulty concentrating, remembering, or making decisions?: Yes Patient able to express need for assistance with ADLs?: Yes Does the patient have difficulty dressing or bathing?: Yes Independently performs ADLs?: No Communication: Needs assistance Is this a change from baseline?: Pre-admission baseline Dressing (OT): Needs assistance Is this a change from baseline?: Pre-admission baseline Grooming: Needs assistance Is this a change from baseline?: Pre-admission baseline Feeding: Independent Bathing: Needs assistance Is this a change from baseline?: Pre-admission baseline In/Out Bed: Independent Walks in Home: Independent Does the patient have difficulty walking or climbing stairs?: Yes Weakness of Legs: None Weakness of Arms/Hands: None  Permission Sought/Granted Permission sought to share information with : Case Manager, Customer service manager, Family Supports Permission granted to share information with : Yes, Verbal Permission Granted  Share Information with NAME: Manson Allan  Permission granted to share info w AGENCY: [place] granted to share info w Relationship: POA     Emotional Assessment   Attitude/Demeanor/Rapport: Unable to Assess Affect (typically observed): Unable to Assess   Alcohol / Substance Use: Not Applicable Psych Involvement: No (comment)  Admission diagnosis:  UTI (urinary tract infection) [N39.0] Patient Active Problem List   Diagnosis Date Noted  . Acute metabolic encephalopathy [id number]  .  CKD (chronic kidney disease), stage IIIa 07/17/2019  . Normocytic anemia 07/17/2019  . UTI (urinary tract infection) 07/17/2019  . Asthma   . Acute  diverticulitis 11/30/2016  . AKI (acute kidney injury) ([place]) 11/30/2016  . Memory loss   . Anemia due to other cause   . GERD (gastroesophageal reflux disease) 02/13/2015  . Meningioma ([place]) 02/13/2015  . Leg swelling 02/13/2015  . Dementia without behavioral disturbance ([place]) 02/13/2015  . Iron deficiency anemia due to chronic blood loss 02/13/2015  . Melena 02/13/2015  . Chest pain 01/27/2014  . Hyperlipidemia 01/27/2014  . Hypertension    PCP:  [facility]:   [place], [address] [place] [address] Phone: [phone] Fax: [phone]     Social Determinants of Health (SDOH) Interventions    Readmission Risk Interventions No flowsheet data found.

## 2019-10-11 NOTE — Evaluation (Signed)
Clinical/Bedside Swallow Evaluation Patient Details  Name: Sheila West MRN: [id number] Date of Birth: 04-02-1930  Today's Date: 10/11/2019 Time: SLP Start Time (ACUTE ONLY): 1210 SLP Stop Time (ACUTE ONLY): 1310 SLP Time Calculation (min) (ACUTE ONLY): 60 min  Past Medical History:  Past Medical History:  Diagnosis Date  . Anemia   . Arthritis    "left shoulder; left hip" (02/13/2015)  . Asthma   . GERD (gastroesophageal reflux disease)   . Heart murmur dx'd 02/13/2015  . Hypercholesterolemia   . Hypertension   . Intracranial meningioma ([place])   . Melanoma ([place])    "I've had multile melanomas removed"  . Memory loss    Past Surgical History:  Past Surgical History:  Procedure Laterality Date  . APPENDECTOMY    . COLONOSCOPY N/A 02/15/2015   Procedure: COLONOSCOPY;  Surgeon: Manus Gunning, MD;  [facility] ENDOSCOPY;  Service: Gastroenterology;  Laterality: N/A;  . ESOPHAGOGASTRODUODENOSCOPY N/A 02/14/2015   Procedure: ESOPHAGOGASTRODUODENOSCOPY (EGD);  Surgeon: Manus Gunning, MD;  Location: [place];  Service: Gastroenterology;  Laterality: N/A;  . GIVENS CAPSULE STUDY N/A 02/17/2015   Procedure: GIVENS CAPSULE STUDY;  Surgeon: Manus Gunning, MD;  Location: [place];  Service: Gastroenterology;  Laterality: N/A;  . JOINT REPLACEMENT    . REPLACEMENT TOTAL KNEE Right   . TOTAL ABDOMINAL HYSTERECTOMY     HPI:  Pt is a 86 y.o. Caucasian female with a known history of Intracranial meningioma, GERD, hypertension, dyslipidemia, asthma and Dementia who presented to the emergency room with acute onset of altered mental status with decreased responsiveness.  No reported fever or chills.  She has been incontinent.  Pt admitted from [facility] [address] after unwitness fall.  Chest x-ray showed no acute cardiopulmonary disease.  Noncontrasted head CT scan revealed stable calcified frontal meningioma without mass-effect with no acute intracranial process.  The  patient had a previous Left parotid mass which was noted during earlier ED admits this year.  Pt has had recent Falls at her NH and per ED chart notes.  Per recent MRI 07/2019: "At least two T2 hyperintense masses within the left parotid gland suspicious for primary parotid neoplasms, measuring up to 12 mm.".  Pt was referred to ENT when first noted per chart notes.     Assessment / Plan / Recommendation Clinical Impression  Pt appears to present w/ oropharyngeal phase dysphagia w/ overt clinical s/s of aspiration noted. Pt is at increased risk for aspiration to occur thus Pulmonary impact, decline. Suspect pt's Baseline Cognitive decline from Dementia is impacting the oropharyngeal swallowing function. Also noted is the Left facial/labial decreased tone/weakness from impact of Left Parotid mass. With verbal/tactile cues and careful attention during feeding support, pt appeared to adequately tolerate trials of Nectar liquids and purees w/out consistent, overt clinical s/s of aspiration noted; no wet vocal quality or decline in respiratory status noted during/post trials. Pt exhibited overt coughing w/ trials of thin liquids despite full support and strict aspiration precautions. Noted oral phase deficits c/b decreased Left labial/facial tone w/ slight anterior spillage on Left corner of mouth and decreased labial seal on Left side impacting use of straw. Slight+ oral residue noted on Left side in buccal area. Given Time and alternating foods/liquids, oral clearing was achieved. Careful feeding and attention to placement of bolus more on the Right side of mouth increased pt's bolus management and eating/drinking. OF note, pt exhibited increased slurping and impulsive/larger sips w/ liquids which were carefully monitored by SLP to reduce  risk for aspiration. OM exam revealed adequate lingual strength; reduced Left facial/labial tone. No bottom Dentition; Upper Denture plate in place. Full Feeding assistance and  cues needed. Recommend a Dysphagia level 1 w/ Nectar liquids via Cup diet; strict aspiration precautions; feeding support at all meals - monitor toleration of diet. Recommend f/u w/ Dietician and Palliative Care consult for [place]. ST services will f/u w/ toleration of diet; ongoing assessment. NSG and MD updated.  SLP Visit Diagnosis: Dysphagia, oropharyngeal phase (R13.12)(baseline Dementia)    Aspiration Risk  Moderate aspiration risk;Risk for inadequate nutrition/hydration    Diet Recommendation  Dysphagia level 1 (puree) w/ Nectar consistency liquids; aspiration precautions - No Straws recommended. Feeding Support and monitoring at all meals.   Medication Administration: Crushed with puree(as able for safer swallowing)    Other  Recommendations Recommended Consults: (Dietician; Palliative Care) Oral Care Recommendations: Oral care BID;Oral care before and after PO;Staff/trained caregiver to provide oral care Other Recommendations: Order thickener from pharmacy;Prohibited food (jello, ice cream, thin soups);Remove water pitcher;Have oral suction available   Follow up Recommendations Skilled Nursing facility(TBD)      Frequency and Duration min 3x week  2 weeks       Prognosis Prognosis for Safe Diet Advancement: Fair Barriers to Reach Goals: Cognitive deficits;Time post onset;Severity of deficits      Swallow Study   General Date of Onset: 10/10/19 HPI: Pt is a 86 y.o. Caucasian female with a known history of Intracranial meningioma, GERD, hypertension, dyslipidemia, asthma and Dementia who presented to the emergency room with acute onset of altered mental status with decreased responsiveness.  No reported fever or chills.  She has been incontinent.  Pt admitted from [facility] after unwitness fall.  Chest x-ray showed no acute cardiopulmonary disease.  Noncontrasted head CT scan revealed stable calcified frontal meningioma without mass-effect with no acute intracranial process.   The patient had a previous Left parotid mass which was noted during earlier ED admits this year.  Pt has had recent Falls at her NH and per ED chart notes.  Per recent MRI 07/2019: "At least two T2 hyperintense masses within the left parotid gland suspicious for primary parotid neoplasms, measuring up to 12 mm.".  Pt was referred to ENT when first noted per chart notes.   Type of Study: Bedside Swallow Evaluation Previous Swallow Assessment: none noted Diet Prior to this Study: Regular;Thin liquids(NSG reported difficulty w/ this diet today) Temperature Spikes Noted: No(wbc 5.6) Respiratory Status: Room air History of Recent Intubation: No Behavior/Cognition: Cooperative;Pleasant mood;Confused;Distractible;Requires cueing;Doesn't follow directions(Awake) Oral Cavity Assessment: Within Functional Limits Oral Care Completed by SLP: Yes(attempted; she bit on swab sticks) Oral Cavity - Dentition: Dentures, top(no bottom dentition) Vision: (CNT) Self-Feeding Abilities: Total assist(Mitts in place ) Patient Positioning: Upright in bed(needed full positioning help) Baseline Vocal Quality: Low vocal intensity(mumbled speech) Volitional Cough: Cognitively unable to elicit Volitional Swallow: Unable to elicit    Oral/Motor/Sensory Function Overall Oral Motor/Sensory Function: Moderate impairment Facial ROM: Reduced left Facial Symmetry: Abnormal symmetry left Facial Strength: Reduced left Lingual ROM: Within Functional Limits(grossly) Lingual Symmetry: Within Functional Limits Lingual Strength: Within Functional Limits Velum: (CNT) Mandible: Within Functional Limits   Ice Chips Ice chips: Impaired Presentation: Spoon(fed; 3 trials) Oral Phase Impairments: Poor awareness of bolus;Reduced labial seal;Reduced lingual movement/coordination Oral Phase Functional Implications: Prolonged oral transit Pharyngeal Phase Impairments: Suspected delayed Swallow;Throat Clearing - Delayed Other Comments: x2/3    Thin Liquid Thin Liquid: Impaired Presentation: Cup(fed/supported; 3 trials) Oral Phase Impairments: Poor awareness  of bolus;Reduced labial seal Pharyngeal  Phase Impairments: Cough - Immediate(x3/3 trials)    Nectar Thick Nectar Thick Liquid: Impaired Presentation: Cup;Spoon;Straw(4-5 trials via each method) Oral Phase Impairments: Reduced labial seal;Poor awareness of bolus Oral phase functional implications: Left anterior spillage;Prolonged oral transit Pharyngeal Phase Impairments: Suspected delayed Swallow;Multiple swallows;Throat Clearing - Delayed(Audible swallows) Other Comments: throat clearing delayed x1   Honey Thick Honey Thick Liquid: Not tested   Puree Puree: Impaired Presentation: Spoon(fed; 10 trials) Oral Phase Impairments: Reduced labial seal;Reduced lingual movement/coordination;Poor awareness of bolus Oral Phase Functional Implications: Prolonged oral transit;Oral residue(Left - slight+) Pharyngeal Phase Impairments: (none) Other Comments: alternating foods/liquids aided clearing   Solid     Solid: Not tested Other Comments: no bottom dentition       Orinda Kenner, MS, CCC-SLP Watson,Katherine 10/11/2019,1:59 PM

## 2019-10-12 DIAGNOSIS — K118 Other diseases of salivary glands: Secondary | ICD-10-CM

## 2019-10-12 LAB — BASIC METABOLIC PANEL
Anion gap: 8 (ref 5–15)
BUN: 31 mg/dL — ABNORMAL HIGH (ref 8–23)
CO2: 24 mmol/L (ref 22–32)
Calcium: 8.2 mg/dL — ABNORMAL LOW (ref 8.9–10.3)
Chloride: 109 mmol/L (ref 98–111)
Creatinine, Ser: 0.8 mg/dL (ref 0.44–1.00)
GFR calc Af Amer: >60 mL/min (ref >60)
GFR calc non Af Amer: >60 mL/min (ref >60)
Glucose, Bld: 90 mg/dL (ref 70–99)
Potassium: 3.9 mmol/L (ref 3.5–5.1)
Sodium: 141 mmol/L (ref 135–145)

## 2019-10-12 LAB — URINE CULTURE: Culture: 80000 — AB

## 2019-10-12 NOTE — Care Plan (Signed)
Called son/POA, [name] to update.  Let him know, pt appeared comfortable and w/out distress, just a little restless.  Explained that she had taken her medicine and eaten a cup of applesauce.  He related he had spoken to ENT MD and he was coming to terms with the report.  Conveyed that I would make sure she was comfortable throughout the night.

## 2019-10-12 NOTE — Plan of Care (Signed)
PMT note:  Consult noted. Patient to be evaluated by oncology today for possible new cancer diagnosis. Will see patient following their conversation.

## 2019-10-12 NOTE — Progress Notes (Addendum)
  Speech Language Pathology Treatment: Dysphagia  Patient Details Name: Sheila West MRN: [id number] DOB: July 28, 1929 Today's Date: 10/12/2019 Time: [id number] SLP Time Calculation (min) (ACUTE ONLY): 50 min  Assessment / Plan / Recommendation Clinical Impression  Pt seen for ongoing assessment of swallowing and toleration of dysphagia diet. At this session today, pt presents w/ increased drowsiness and increased dysarthria. Noted min dysarthria at evaluation yesterday d/t impact of parotid mass on the labial/facial muscles. Today, pt only gives a slight verbal response to verbal/tactile stim - reduced from yesterday. NSG reported no sedating meds were given last shift.  During oral care, pt exhibited increased lingual reaction and adequate strength to oral swabs introduced; SLP's finger assessing upper Denture plate positioning. Noted an auditory, involuntary swallow w/ min lingual protrusion to the stim of oral assessment by SLP. When given verbal/tactile stim to increase pt's awareness and readiness for po's, 2 half tsps of Nectar consistency liquid trials were given w/ similar audible and poorly coordinated swallows noted; wet respiratory sounds following. No further trials were given d/t increased risk for aspiration and pulmonary decline.  Recommend strict NPO status d/t increased risk for aspiration and pulmonary decline. MD and NSG consulted post session re: pt's presentation and need for meds given via alternative means. Recommend frequent oral care for hygiene and stimulation of swallowing. ST services will continue to follow pt's status while admitted for safety w/ po trials and POC. Recommend f/u w/ Palliative Care services for [place].     HPI HPI: Pt is a 86 y.o. Caucasian female with a known history of Intracranial meningioma, GERD, hypertension, dyslipidemia, asthma and Dementia who presented to the emergency room with acute onset of altered mental status with decreased responsiveness.  No  reported fever or chills.  She has been incontinent.  Pt admitted from [facility] after unwitness fall.  Chest x-ray showed no acute cardiopulmonary disease.  Noncontrasted head CT scan revealed stable calcified frontal meningioma without mass-effect with no acute intracranial process.  The patient had a previous Left parotid mass which was noted during earlier ED admits this year.  Pt has had recent Falls at her NH and per ED chart notes.  Per recent MRI 07/2019: "At least two T2 hyperintense masses within the left parotid gland suspicious for primary parotid neoplasms, measuring up to 12 mm.".  Pt was referred to ENT when first noted per chart notes.        SLP Plan  Continue with current plan of care;Goals updated       Recommendations  Diet recommendations: NPO Medication Administration: Via alternative means(or held)                General recommendations: (Palliative consult) Oral Care Recommendations: Oral care QID;Staff/trained caregiver to provide oral care Follow up Recommendations: (TBD) SLP Visit Diagnosis: Dysphagia, oropharyngeal phase (R13.12)(baseline Dementia) Plan: Continue with current plan of care;Goals updated       [place], [place], CCC-SLP Yvetta Drotar 10/12/2019, 11:55 AM

## 2019-10-12 NOTE — Consult Note (Signed)
[facility]  Date of admission:  10/10/2019  Inpatient day:  10/12/2019  Consulting physician:  Dr Sheila West   Reason for Consultation:  Parotid mass  Chief Complaint: [name] is a 86 y.o. female with dementia and an enlarging left parotid mass who was admitted through the ER with altered mental status.  HPI:   The history is obtained through her medical power of attorney, Sheila West, as well as review of medical records.  The patient developed early dementia in 2015/2016.  She was placed at [facility] in 10/2018 secondary to progressive dementia.  She has had a general decline in mentation.  Sheila West notes that that time she speaks and sometimes she does not.  She does not remember him; he has known her since he was 86 years old.  He states that he believes that if she could feed herself and walk around.  She needed assistance with bathing and dressing.  Visitation has been somewhat limited since COVID-19.  Approximately one month ago,  the physician making house calls at [facility] noted a left cheek mass.  Maxillofacial CT scan on 09/02/2019 revealed a 2.4 x 3.0 cm left parotid mass with mild central necrosis suspicious for primary parotid neoplasm.  There was an additional 1.6 x 2.1 cm soft tissue lesion anteriorly suspicious for necrotic node as well as a 1.2 x 1.4 cm necrotic lesion posteriorly.    The patient was found on the floor at [facility] on 10/10/2019.  By report she was disoriented and had urinary incontinence.  She was brought to [facility] ER.  She was diagnosed with a urinary tract infection.  Urine culture revealed 80,000 colonies/milliliter E. coli and greater than 100,000 colonies/mL viridans Streptococcus.  E. coli was pansensitive.  The patient has subsequently had follow-up imaging.  Head and cervical spine CT without contrast revealed no acute abnormality.  There was atrophy, chronic microvascular ischemic change, and a stable  right frontal meningioma.  Soft tissue neck CT revealed a 3 x 5 cm left parotid mass with multiple areas of internal low-density likely representing necrosis.  There was a 1.4 cm enlarged lymph node just inferior to the left parotid.  The patient was evaluated by speech therapy today for swallowing and dietary evaluation.  She was noted to have dysarthria and increased drowsiness.  She was felt at high risk for aspiration and pulmonary decline.  She was placed n.p.o.   Past Medical History:  Diagnosis Date  . Anemia   . Arthritis    "left shoulder; left hip" (02/13/2015)  . Asthma   . GERD (gastroesophageal reflux disease)   . Heart murmur dx'd 02/13/2015  . Hypercholesterolemia   . Hypertension   . Intracranial meningioma ([place])   . Melanoma ([place])    "I've had multile melanomas removed"  . Memory loss     Past Surgical History:  Procedure Laterality Date  . APPENDECTOMY    . COLONOSCOPY N/A 02/15/2015   Procedure: COLONOSCOPY;  Surgeon: Manus Gunning, MD;  Location: [facility] ENDOSCOPY;  Service: Gastroenterology;  Laterality: N/A;  . ESOPHAGOGASTRODUODENOSCOPY N/A 02/14/2015   Procedure: ESOPHAGOGASTRODUODENOSCOPY (EGD);  Surgeon: Manus Gunning, MD;  Location: [place];  Service: Gastroenterology;  Laterality: N/A;  . GIVENS CAPSULE STUDY N/A 02/17/2015   Procedure: GIVENS CAPSULE STUDY;  Surgeon: Manus Gunning, MD;  Location: [place];  Service: Gastroenterology;  Laterality: N/A;  . JOINT REPLACEMENT    . REPLACEMENT TOTAL KNEE Right   .  TOTAL ABDOMINAL HYSTERECTOMY      Family History  Problem Relation Age of Onset  . Cancer Sister   . Cancer Sister   . Dementia Sister     Social History:  reports that she has never smoked. She has never used smokeless tobacco. She reports that she does not drink alcohol or use drugs.  The patient has lived at [facility] since 10/2018.  Her medical power of attorney is Sheila West, a friend since he was 60  years old and in church with her.  He has been helping keep up her yard since 09-03-98.  Unbeknownst to him, he was made power of attorney in 2005-09-02.  Patient has a 11 year old sister who lives in [place].  Another sister died in Sep 02, 2004.  A nephew lives in [place].  She has no children.  She is accompanied by her nurse today.  Allergies: No Known Allergies  Medications Prior to Admission  Medication Sig Dispense Refill  . acetaminophen (TYLENOL) 500 MG tablet Take 500 mg by mouth every 4 (four) hours as needed for mild pain or fever.    Marland Kitchen alum & mag hydroxide-simeth (MINTOX) [id number] MG/5ML suspension Take 30 mLs by mouth 4 (four) times daily as needed for indigestion or heartburn.    . donepezil (ARICEPT) 10 MG tablet Take 1 tablet (10 mg total) by mouth at bedtime. 90 tablet 4  . guaiFENesin (ROBITUSSIN) 100 MG/5ML liquid Take 200 mg by mouth every 6 (six) hours as needed for cough.    Marland Kitchen ketoconazole (NIZORAL) 2 % shampoo Apply 1 application topically 2 (two) times a week.    . loperamide (IMODIUM) 2 MG capsule Take 2 mg by mouth as needed for diarrhea or loose stools. (max 8 doses in 24 hours)    . magnesium hydroxide (MILK OF MAGNESIA) 400 MG/5ML suspension Take 30 mLs by mouth at bedtime as needed for mild constipation.    . meloxicam (MOBIC) 7.5 MG tablet Take 7.5 mg by mouth daily.    Marland Kitchen neomycin-bacitracin-polymyxin (NEOSPORIN) ointment Apply 1 application topically as needed for wound care.    Marland Kitchen OLANZapine (ZYPREXA) 5 MG tablet Take 7.5 mg by mouth daily with supper.    Marland Kitchen omeprazole (PRILOSEC) 20 MG capsule Take 20 mg by mouth daily.      Review of Systems: Unable to be obtained as patient lethargic and mumbles only a few words when stimulated.  Physical Exam:  Blood pressure 113/72, pulse 66, temperature 98.5 F (36.9 C), temperature source Axillary, resp. rate 19, height 5\' 5"  (1.651 m), weight 132 lb (59.9 kg), SpO2 98 %.  GENERAL:  Elderly woman lying in bed on the medical unit  sleeping with arms folded to her chest. MENTAL STATUS:  Somnolent.  Patient is arousable only to sternal rub . HEAD:  Short hair .  Normocephalic, atraumatic, face symmetric, no Cushingoid features. EYES:  Pupils equal round and reactive to light and accomodation.  No conjunctivitis or scleral icterus. ENT:  Approximately 5 cm hard fixed left parotid mass.  Oropharynx clear with limited view.  Mouth breathing.  Upper dentures.  No lower teeth.  Tongue normal.  Mucous membranes dry.  RESPIRATORY:  Clear to auscultation anteriorly without rales, wheezes or rhonchi. CARDIOVASCULAR:  Regular rate and rhythm without murmur, rub or gallop. ABDOMEN:  Soft, non-tender, with active bowel sounds, and no hepatosplenomegaly.  No masses. SKIN:  No rashes, ulcers or lesions. EXTREMITIES: No edema, no skin discoloration or tenderness.  No palpable cords. LYMPH NODES:  No palpable cervical, supraclavicular, axillary or inguinal adenopathy  NEUROLOGICAL: Poorly responsive.  At times speaks a few words.  She does not answer questions.  Partial left facial paralysis.   Results for orders placed or performed during the hospital encounter of 10/10/19 (from the past 48 hour(s))  Basic metabolic panel     Status: Abnormal   Collection Time: 10/11/19  4:43 AM  Result Value Ref Range   Sodium 143 135 - 145 mmol/L   Potassium 4.0 3.5 - 5.1 mmol/L   Chloride 112 (H) 98 - 111 mmol/L   CO2 25 22 - 32 mmol/L   Glucose, Bld 91 70 - 99 mg/dL    Comment: Glucose reference range applies only to samples taken after fasting for at least 8 hours.   BUN 31 (H) 8 - 23 mg/dL   Creatinine, Ser 0.87 0.44 - 1.00 mg/dL   Calcium 8.3 (L) 8.9 - 10.3 mg/dL   GFR calc non Af Amer 60 (L) >60 mL/min   GFR calc Af Amer >60 >60 mL/min   Anion gap 6 5 - 15    Comment: Performed at [facility], [address] [place], [place] [address]  CBC     Status: Abnormal   Collection Time: 10/11/19  4:43 AM  Result Value Ref Range    WBC 5.6 4.0 - 10.5 K/uL   RBC 3.57 (L) 3.87 - 5.11 MIL/uL   Hemoglobin 10.5 (L) 12.0 - 15.0 g/dL   HCT 32.3 (L) 36.0 - 46.0 %   MCV 90.5 80.0 - 100.0 fL   MCH 29.4 26.0 - 34.0 pg   MCHC 32.5 30.0 - 36.0 g/dL   RDW 13.2 11.5 - 15.5 %   Platelets 273 150 - 400 K/uL   nRBC 0.0 0.0 - 0.2 %    Comment: Performed at [facility], [address] [place], [place] [id number]  Basic metabolic panel     Status: Abnormal   Collection Time: 10/12/19  3:34 AM  Result Value Ref Range   Sodium 141 135 - 145 mmol/L   Potassium 3.9 3.5 - 5.1 mmol/L   Chloride 109 98 - 111 mmol/L   CO2 24 22 - 32 mmol/L   Glucose, Bld 90 70 - 99 mg/dL    Comment: Glucose reference range applies only to samples taken after fasting for at least 8 hours.   BUN 31 (H) 8 - 23 mg/dL   Creatinine, Ser 0.80 0.44 - 1.00 mg/dL   Calcium 8.2 (L) 8.9 - 10.3 mg/dL   GFR calc non Af Amer >60 >60 mL/min   GFR calc Af Amer >60 >60 mL/min   Anion gap 8 5 - 15    Comment: Performed at [facility], [address], [place],  [address]   CT SOFT TISSUE NECK W CONTRAST  Result Date: 10/11/2019 CLINICAL DATA:  Parotid mass.  Chronic enlargement.  Dementia. EXAM: CT NECK WITH CONTRAST TECHNIQUE: Multidetector CT imaging of the neck was performed using the standard protocol following the bolus administration of intravenous contrast. CONTRAST:  58mL OMNIPAQUE IOHEXOL 300 MG/ML  SOLN COMPARISON:  09/02/2019 CT.  07/17/2019 MRI. FINDINGS: Pharynx and larynx: No mucosal or submucosal lesion. Salivary glands: Submandibular glands are normal. Right parotid gland is normal. There is been marked progression of a pathologic process of the left parotid gland most consistent with a parotid malignancy. Tumor mass now measures up to 3 x 5 cm in size and contains multiple areas of internal low  density presumed represent necrosis. Inflammatory disease could less likely have this appearance. Thyroid: Normal Lymph nodes: Enlarged lymph  node just inferior to the left parotid gland with diameter up to 1.4 cm with internal low density consistent with metastatic involvement. Vascular: Atherosclerotic calcification at the carotid bifurcations, not evaluated in detail using this technique. Limited intracranial: Negative as included. Visualized orbits: Negative Mastoids and visualized paranasal sinuses: Clear Skeleton: Chronic spondylosis and facet arthropathy. Upper chest: Negative Other: None IMPRESSION: Progression of a left parotid process most consistent with a rapidly growing parotid malignancy. Mass lesion measures up to 3 x 5 cm and contains areas of internal necrosis/low-density. Lesion involves both the deep and superficial lobes. Abnormal lymph node just inferior to the parotid gland consistent with metastatic involvement. Infectious inflammatory disease could have this appearance but would be less likely. Electronically Signed   By: Nelson Chimes M.D.   On: 10/11/2019 16:46    Assessment:  The patient is a 86 y.o. woman with with marked dementia admitted with a Ecoli and streptococcus viradans UTI.  She has a rapidly growing left parotid mass.  Soft tissue neck CT on 0510/2021 revealed a 3 x 5 cm left parotid mass with multiple areas of internal low-density likely representing necrosis.  There was a 1.4 cm enlarged lymph node just inferior to the left parotid.  She has a history of "multiple melanomas".  Symptomatically, she is somnolent.  Exam reveals a 5 cm fixed left parotid mass.  Performance status is 4.  Plan:   1.   Left parotid mass  Images personally reviewed.  Agree with radiology interpretation.  She has an aggressive rapidly enlarging malignant parotid mass.  Differential includes a parotid gland carcinoma, lymphoma or a lesion metastatic to the parotid (melanoma).   If this is a parotid gland carcinoma, she has a [id number] lesion (stage IVA).  I spoke with the patient's power of attorney, Sheila West.  She is not  a surgical candidate given her underlying medical issues; surgery is unlikely to provide improvement in quality of life.  She is not a candidate for chemotherapy.    We discussed consideration of palliative radiation.  After some discussion with her power of attorney, decision made not to pursue a biopsy given treatment options.   We discussed supportive care/palliative care as well as Hospice. 2.   Dementia  Patient has had progressive dementia since 2015/2016 with a significant decline in 10/2018.  Prior to recent events, patient's performance status was 3.  Suspect UTI has made her tenuous mental status decline further.  Her power of attorney is unsure of tube feeds or other aggressive measures.  Code status is DNR.  3.   UTI  Patient currently on ceftriaxone. 4.   Disposition  Consult palliative care.   Thank you for allowing me to participate in Sheila West 's care.  I will follow her closely with you while hospitalized and after discharge in the outpatient department.   Approximately 20 minutes were spent reviewing her chart ([place]) including notes, labs, and imaging studies.  An additional 21 minutes were spent speaking to her power of attorney this evening.   Sheila Asal, MD  10/12/2019, 8:37 PM

## 2019-10-12 NOTE — Progress Notes (Signed)
PROGRESS NOTE    Sheila West   [id number]  DOB: [birth year]  PCP: Orvis Brill, [facility]    DOA: 10/10/2019 LOS: 2   Brief Narrative   Sheila West  is a 86 y.o. Caucasian female with a history of hypertension, dyslipidemia, asthma, chronic parotid gland enlargement, and dementia who presented to the emergency room with altered mental status with decreased responsiveness.  In the ED, hypertensive 177/83 otherwise normal vitals.  Labs notable for mild anemia, normal BMP.  UA was consistent wit infection, sent for culture.  Chest x-ray showed no acute cardiopulmonary disease.  Noncontrasted head CT scan revealed stable calcified frontal meningioma without mass-effect with no acute intracranial process.  The patient had a previous left parotid mass that will need follow-up neck CT. C-spine CT showed diffuse cervical spondylosis and facet hypertrophy and stable cystic structures within the left parotid gland with no acute C-spine fracture. EKG showed normal sinus rhythm with rate of 98 with poor R wave progression.  Admitted to hospitalist service for further evaluation and management.  Being treated with IV antibiotics for UTI.     Assessment & Plan   Principal Problem:   UTI (urinary tract infection) Active Problems:   Acute metabolic encephalopathy   Dysphagia   Hypertension   Hyperlipidemia   GERD (gastroesophageal reflux disease)   Meningioma (HCC)   Dementia with behavioral disturbance (HCC)   Parotid gland mass - chronic.  Her left facial droop which includes her forehead, and her dysphagia, could be due to compression of CN's V and VII.  POA reported CT of neck had been planned but not done yet.  CT soft tissue neck is consistent with malignancy with mass invading the facial nerve and lymph node involvement.  No further staging done yet. --Oncology and Palliative consulted --ENT consulted --NPO  UTI (urinary tract infection) - present on admission. --Continue  empiric Rocephin pending culture --follow urine culture --stop IV fluids  Acute metabolic encephalopathy - present on admission, has been progressive since admission.  Initially attributed to UTI in setting of dementia, but possible malignancy is a factor.  Mentation appears to be at baseline based on reports from her SNF staff. --mgmt as above --neurochecks --monitor --avoid sedating medications  Dysphagia - evaluated by SLP.  Unknown if new or not. Possibly due to the parotid mass. --NPO  Hypertension - chronic, uncontrolled on admission with BP 177/83.   --Continue home amlodipine --PRN IV Labetalol  Hyperlipidemia - continue statin  GERD - continue PPI  Meningioma - appears stable.  Monitor as above.  Dementia with behavioral disturbance - continue home Aricept and Zyprexa.  IM Haldol PRN.   Patient BMI: Body mass index is 21.97 kg/m.   DVT prophylaxis: Lovenox  Diet:  Diet Orders (From admission, onward)    Start     Ordered   10/12/19 1019  Diet NPO time specified  Diet effective now     10/12/19 1019            Code Status: DNR    Subjective 10/12/19    Patient seen at bedside.  She in almost unresponsive today, grimaces to sternal rub for me but will not open eyes or interact.  Appears in no distress and no acute events reported.     Disposition Plan & Communication   Status is: Inpatient  Remains inpatient appropriate because:IV treatments appropriate due to intensity of illness or inability to take PO.  Oncology and palliative consults pending due to likely malignancy,  dispo will depend on goal of care discussion outcome.   Dispo: The patient is from: SNF              Anticipated d/c is to: SNF vs hospice              Anticipated d/c date is: 2 days              Patient currently is not medically stable to d/c.   Family Communication: none at bedside during encounter. POA to speak with consultants as above today.  I spoke with POA yesterday, and  ENT did inform POA on results of the CT yesterday showing very likely cancer.   Consults, Procedures, Significant Events   Consultants:   None  Procedures:   None  Antimicrobials:   Rocephin     Objective   Vitals:   10/11/19 1200 10/11/19 1600 10/11/19 2335 10/12/19 0748  BP: (!) 144/63 133/83 (!) 109/51 (!) 116/51  Pulse:   74   Resp:  19 18 19   Temp:  98.2 F (36.8 C) 100.1 F (37.8 C) 98.4 F (36.9 C)  TempSrc:  Oral Axillary Axillary  SpO2:  98% 98%   Weight:      Height:       No intake or output data in the 24 hours ending 10/12/19 1532 Filed Weights   10/10/19 1828  Weight: 59.9 kg    Physical Exam:  General exam: unresponsive, no acute distress HEENT: left facial enlargement/mass, left facial droop Respiratory system: clear anteriorly and laterally, normal respiratory effort. Cardiovascular system: normal S1/S2, RRR, no pedal edema.     Labs   Data Reviewed: I have personally reviewed following labs and imaging studies  CBC: Recent Labs  Lab 10/10/19 1849 10/11/19 0443  WBC 6.8 5.6  HGB 11.5* 10.5*  HCT 35.6* 32.3*  MCV 92.5 90.5  PLT 281 [id number]   Basic Metabolic Panel: Recent Labs  Lab 10/10/19 1849 10/11/19 0443 10/12/19 0334  NA 145 143 141  K 5.0 4.0 3.9  CL 112* 112* 109  CO2 25 25 24   GLUCOSE 112* 91 90  BUN 36* 31* 31*  CREATININE 0.97 0.87 0.80  CALCIUM 8.9 8.3* 8.2*   GFR: Estimated Creatinine Clearance: 44.6 mL/min (by C-G formula based on SCr of 0.8 mg/dL). Liver Function Tests: No results for input(s): AST, ALT, ALKPHOS, BILITOT, PROT, ALBUMIN in the last 168 hours. No results for input(s): LIPASE, AMYLASE in the last 168 hours. No results for input(s): AMMONIA in the last 168 hours. Coagulation Profile: No results for input(s): INR, PROTIME in the last 168 hours. Cardiac Enzymes: No results for input(s): CKTOTAL, CKMB, CKMBINDEX, TROPONINI in the last 168 hours. BNP (last 3 results) No results for input(s):  PROBNP in the last 8760 hours. HbA1C: No results for input(s): HGBA1C in the last 72 hours. CBG: No results for input(s): GLUCAP in the last 168 hours. Lipid Profile: No results for input(s): CHOL, HDL, LDLCALC, TRIG, CHOLHDL, LDLDIRECT in the last 72 hours. Thyroid Function Tests: No results for input(s): TSH, T4TOTAL, FREET4, T3FREE, THYROIDAB in the last 72 hours. Anemia Panel: No results for input(s): VITAMINB12, FOLATE, FERRITIN, TIBC, IRON, RETICCTPCT in the last 72 hours. Sepsis Labs: No results for input(s): PROCALCITON, LATICACIDVEN in the last 168 hours.  Recent Results (from the past 240 hour(s))  SARS Coronavirus 2 by RT PCR (hospital order, performed in [facility] hospital lab) Nasopharyngeal Nasopharyngeal Swab     Status: None   Collection Time:  10/10/19  6:24 PM   Specimen: Nasopharyngeal Swab  Result Value Ref Range Status   SARS Coronavirus 2 NEGATIVE NEGATIVE Final    Comment: (NOTE) SARS-CoV-2 target nucleic acids are NOT DETECTED. The SARS-CoV-2 RNA is generally detectable in upper and lower respiratory specimens during the acute phase of infection. The lowest concentration of SARS-CoV-2 viral copies this assay can detect is 250 copies / mL. A negative result does not preclude SARS-CoV-2 infection and should not be used as the sole basis for treatment or other patient management decisions.  A negative result may occur with improper specimen collection / handling, submission of specimen other than nasopharyngeal swab, presence of viral mutation(s) within the areas targeted by this assay, and inadequate number of viral copies (<250 copies / mL). A negative result must be combined with clinical observations, patient history, and epidemiological information. Fact Sheet for Patients:   StrictlyIdeas.no Fact Sheet for Healthcare Providers: BankingDealers.co.za This test is not yet approved or cleared  by the [place] FDA and has been authorized for detection and/or diagnosis of SARS-CoV-2 by FDA under an Emergency Use Authorization (EUA).  This EUA will remain in effect (meaning this test can be used) for the duration of the COVID-19 declaration under Section 564(b)(1) of the Act, 21 U.S.C. section 360bbb-3(b)(1), unless the authorization is terminated or revoked sooner. Performed at [facility], [address] [place], [place] [address]   Urine Culture     Status: Abnormal   Collection Time: 10/10/19  6:49 PM   Specimen: Urine, Clean Catch  Result Value Ref Range Status   Specimen Description   Final    URINE, CLEAN CATCH Performed at [facility], [address], [place], [place] [address]    Special Requests   Final    NONE Performed at [facility], [place], [place] [address]    Culture (A)  Final    80,000 COLONIES/mL ESCHERICHIA COLI >=100,000 COLONIES/mL VIRIDANS STREPTOCOCCUS Standardized susceptibility testing for this organism is not available. Performed at [facility], [place] [address] [place], [place] [address]    Report Status 10/12/2019 FINAL  Final   Organism ID, Bacteria ESCHERICHIA COLI (A)  Final      Susceptibility   Escherichia coli - MIC*    AMPICILLIN 8 SENSITIVE Sensitive     CEFAZOLIN <=4 SENSITIVE Sensitive     CEFTRIAXONE <=1 SENSITIVE Sensitive     CIPROFLOXACIN <=0.25 SENSITIVE Sensitive     GENTAMICIN <=1 SENSITIVE Sensitive     IMIPENEM <=0.25 SENSITIVE Sensitive     NITROFURANTOIN <=16 SENSITIVE Sensitive     TRIMETH/SULFA <=20 SENSITIVE Sensitive     AMPICILLIN/SULBACTAM <=2 SENSITIVE Sensitive     PIP/TAZO <=4 SENSITIVE Sensitive     * 80,000 COLONIES/mL ESCHERICHIA COLI      Imaging Studies   CT Head Wo Contrast  Result Date: 10/10/2019 CLINICAL DATA:  Head trauma, headache EXAM: CT HEAD WITHOUT CONTRAST TECHNIQUE: Contiguous axial images were obtained from the base of the skull through  the vertex without intravenous contrast. COMPARISON:  09/20/2019 FINDINGS: Brain: No acute infarct or hemorrhage. The lateral ventricles and midline structures are stable. Basal ganglia calcifications again noted. No acute extra-axial fluid collections. Calcified mass in the right frontal region consistent with meningioma. No mass effect. Vascular: No hyperdense vessel or unexpected calcification. Skull: Normal. Negative for fracture or focal lesion. Sinuses/Orbits: No acute finding. Other: The left parotid mass seen on prior CT is not as  well appreciated on this exam due to slice selection. IMPRESSION: 1. Stable calcified right frontal meningioma without mass effect. 2. No acute intracranial process. 3. Please refer to prior CT face report 09/02/2019 describing a left parotid mass requiring nonemergent follow-up with CT neck with contrast. Electronically Signed   By: Randa Ngo M.D.   On: 10/10/2019 19:27   CT SOFT TISSUE NECK W CONTRAST  Result Date: 10/11/2019 CLINICAL DATA:  Parotid mass.  Chronic enlargement.  Dementia. EXAM: CT NECK WITH CONTRAST TECHNIQUE: Multidetector CT imaging of the neck was performed using the standard protocol following the bolus administration of intravenous contrast. CONTRAST:  84mL OMNIPAQUE IOHEXOL 300 MG/ML  SOLN COMPARISON:  09/02/2019 CT.  07/17/2019 MRI. FINDINGS: Pharynx and larynx: No mucosal or submucosal lesion. Salivary glands: Submandibular glands are normal. Right parotid gland is normal. There is been marked progression of a pathologic process of the left parotid gland most consistent with a parotid malignancy. Tumor mass now measures up to 3 x 5 cm in size and contains multiple areas of internal low density presumed represent necrosis. Inflammatory disease could less likely have this appearance. Thyroid: Normal Lymph nodes: Enlarged lymph node just inferior to the left parotid gland with diameter up to 1.4 cm with internal low density consistent with metastatic  involvement. Vascular: Atherosclerotic calcification at the carotid bifurcations, not evaluated in detail using this technique. Limited intracranial: Negative as included. Visualized orbits: Negative Mastoids and visualized paranasal sinuses: Clear Skeleton: Chronic spondylosis and facet arthropathy. Upper chest: Negative Other: None IMPRESSION: Progression of a left parotid process most consistent with a rapidly growing parotid malignancy. Mass lesion measures up to 3 x 5 cm and contains areas of internal necrosis/low-density. Lesion involves both the deep and superficial lobes. Abnormal lymph node just inferior to the parotid gland consistent with metastatic involvement. Infectious inflammatory disease could have this appearance but would be less likely. Electronically Signed   By: Nelson Chimes M.D.   On: 10/11/2019 16:46   CT Cervical Spine Wo Contrast  Result Date: 10/10/2019 CLINICAL DATA:  Un witnessed fall, headache, dimension EXAM: CT CERVICAL SPINE WITHOUT CONTRAST TECHNIQUE: Multidetector CT imaging of the cervical spine was performed without intravenous contrast. Multiplanar CT image reconstructions were also generated. COMPARISON:  09/20/2019 FINDINGS: Alignment: Grossly normal anatomic alignment. Skull base and vertebrae: No acute displaced fractures. Soft tissues and spinal canal: No prevertebral fluid or swelling. No visible canal hematoma. Cystic structures within the left parotid gland are again identified, unchanged. Please refer to prior CT discussion recommending CT neck with contrast on a nonemergent basis for complete characterization. Disc levels: There is diffuse cervical spondylosis and facet hypertrophy most pronounced from C3 through C6. There is symmetrical neural foraminal narrowing at C3-4, C4-5, and C5-6. Upper chest: Airway is patent. Lung apices are clear. Other: Reconstructed images demonstrate no additional findings. IMPRESSION: 1. No acute cervical spine fracture. 2. Diffuse  cervical spondylosis and facet hypertrophy. 3. Stable cystic structures within the left parotid gland. Please refer to prior CT discussion recommending CT neck with contrast on a nonemergent basis for complete characterization. Electronically Signed   By: Randa Ngo M.D.   On: 10/10/2019 19:30   DG Chest Portable 1 View  Result Date: 10/10/2019 CLINICAL DATA:  86 year old female with altered sensory. EXAM: PORTABLE CHEST 1 VIEW COMPARISON:  Chest radiograph dated 07/17/2019. FINDINGS: No focal consolidation, pleural effusion or pneumothorax. Probable right lung base atelectasis. Calcified granuloma in the left upper lung field the cardiac silhouette is within limits.  Atherosclerotic calcification of the aorta. Degenerative changes of the spine and shoulders. No acute osseous pathology. IMPRESSION: No active cardiopulmonary disease. Electronically Signed   By: Anner Crete M.D.   On: 10/10/2019 19:38     Medications   Scheduled Meds: . amLODipine  5 mg Oral Daily  . donepezil  10 mg Oral QHS  . enoxaparin (LOVENOX) injection  40 mg Subcutaneous Q24H  . ketoconazole  1 application Topical Once per day on Mon Thu  . OLANZapine  7.5 mg Oral Q supper  . pantoprazole  40 mg Oral Daily  . sodium chloride flush  10-40 mL Intracatheter Q12H   Continuous Infusions: . cefTRIAXone (ROCEPHIN)  IV Stopped (10/12/19 0526)       LOS: 2 days    Time spent: 32 minutes total with > 50% spent in coordination of care and direct patient care.    Ezekiel Slocumb, DO [facility]  10/12/2019, 3:32 PM    If 7PM-7AM, please contact night-coverage. How to contact the [facility] Attending or Consulting provider [place] or covering provider during after hours [place], for this patient?    1. Check the care team in [facility] and look for a) attending/consulting [facility] provider listed and b) the [facility] team listed 2. Log into [email or web address] and use [facility]'s universal password to access. If you do not have  the password, please contact the hospital operator. 3. Locate the [facility] provider you are looking for under [facility] and page to a number that you can be directly reached. 4. If you still have difficulty reaching the provider, please page the [facility] (Director on Call) for the Hospitalists listed on amion for assistance.

## 2019-10-13 DIAGNOSIS — Z7189 Other specified counseling: Secondary | ICD-10-CM

## 2019-10-13 MED ORDER — GLYCOPYRROLATE 0.2 MG/ML IJ SOLN: 0.2000 mg | INTRAMUSCULAR | Status: DC | PRN

## 2019-10-13 MED ORDER — ONDANSETRON HCL 4 MG/2ML IJ SOLN: 4.0000 mg | Freq: Four times a day (QID) | INTRAMUSCULAR | Status: DC | PRN

## 2019-10-13 MED ORDER — HALOPERIDOL 0.5 MG PO TABS
0.5000 mg | ORAL_TABLET | ORAL | Status: DC | PRN
  Filled 2019-10-13: qty 1

## 2019-10-13 MED ORDER — HALOPERIDOL LACTATE 2 MG/ML PO CONC
0.5000 mg | ORAL | Status: DC | PRN
  Filled 2019-10-13: qty 0.3

## 2019-10-13 MED ORDER — SODIUM CHLORIDE 0.9% FLUSH: 3.0000 mL | INTRAVENOUS | Status: DC | PRN

## 2019-10-13 MED ORDER — GLYCOPYRROLATE 1 MG PO TABS
1.0000 mg | ORAL_TABLET | ORAL | Status: DC | PRN
  Filled 2019-10-13: qty 1

## 2019-10-13 MED ORDER — HALOPERIDOL LACTATE 5 MG/ML IJ SOLN: 0.5000 mg | INTRAMUSCULAR | Status: DC | PRN

## 2019-10-13 MED ORDER — LORAZEPAM 2 MG/ML IJ SOLN
0.5000 mg | INTRAMUSCULAR | Status: DC | PRN
  Filled 2019-10-13: qty 1

## 2019-10-13 MED ORDER — LORAZEPAM 2 MG/ML PO CONC: 1.0000 mg | ORAL | Status: DC | PRN

## 2019-10-13 MED ORDER — ONDANSETRON 4 MG PO TBDP
4.0000 mg | ORAL_TABLET | Freq: Four times a day (QID) | ORAL | Status: DC | PRN
  Filled 2019-10-13: qty 1

## 2019-10-13 MED ORDER — POLYVINYL ALCOHOL 1.4 % OP SOLN
1.0000 [drp] | Freq: Four times a day (QID) | OPHTHALMIC | Status: DC | PRN
  Filled 2019-10-13: qty 15

## 2019-10-13 MED ORDER — SODIUM CHLORIDE 0.9% FLUSH
3.0000 mL | Freq: Two times a day (BID) | INTRAVENOUS | Status: DC
  Administered 2019-10-13 (×2): 3 mL via INTRAVENOUS

## 2019-10-13 MED ORDER — BIOTENE DRY MOUTH MT LIQD: 15.0000 mL | OROMUCOSAL | Status: DC | PRN

## 2019-10-13 MED ORDER — MORPHINE SULFATE (PF) 2 MG/ML IV SOLN: 1.0000 mg | INTRAVENOUS | Status: DC | PRN

## 2019-10-13 MED ORDER — LORAZEPAM 1 MG PO TABS: 1.0000 mg | ORAL_TABLET | ORAL | Status: DC | PRN

## 2019-10-13 NOTE — Progress Notes (Signed)
PROGRESS NOTE    Sheila West  [id number] DOB: [birth year] DOA: 10/10/2019 PCP: Orvis Brill, [facility]      Brief Narrative:  Sheila West is a 86 y.o. F with HTN, dementia lives in ALF, and chronic parotid gland swelling who presented with AMS and decreased responsiveness.  In the ER BP elevated, labs unremarkable.  UA with pyuria.  CT head and neck showed malignancy with mass invading Sheila West facial nerve and lymph node involvement.        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  Oncology and palliative care had a goals of care discussion with the Sheila West's power of attorney; given Sheila West is not a surgical nor chemotherapy candidate, biopsy and further work-up were deferred.  Supportive cares and hospice were recommended, as this is likely a rapidly progressive cancer.  In the last 24 hours, the Sheila West has remained minimally responsive, unable to take anything by mouth.  Palliative care discussed goals of care with family today, and have transition to comfort measures. -Pain control/analgesics as needed -SLP consult -I recommend transition to comfort measures and inpatient Hospice; without oral intake, Sheila West has likely days to week to live   Hypertension  Dementia  Pyuria Asymptomatic bacteriuria No signs/symptoms to suggest infection.  Clinical significance dubious -Stop antibiotics     Disposition: Status is: Inpatient  Remains inpatient appropriate because:actively dying   Dispo: The Sheila West is from: ALF              Anticipated d/c is to: residential hospice   Palliative care will have ongoing discussions with family, regarding hospice, comfort measures.  I recommend comfort measures and transition to residential hospice,  or in-hospital death.              MDM: The below labs and imaging reports were reviewed and summarized above.  Medication management as above.    DVT prophylaxis: Not applicable Code Status: DNR Family Communication:     Consultants:   Palliative care  Oncology  Procedures:     Antimicrobials:      Culture data:              Subjective: Sheila West is not responsive.  No fever overnight.  Sheila West has not woken up enough to take anything by mouth.  Sheila West is not participating in self-cares.  No respiratory distress.  Occasionally appears in pain  Objective: Vitals:   10/12/19 0748 10/12/19 1533 10/12/19 2322 10/13/19 1029  BP: (!) 116/51 113/72 124/61 (!) 141/77  Pulse:  66 (!) 51 61  Resp: 19 19 17 17   Temp: 98.4 F (36.9 C) 98.5 F (36.9 C) (!) 97.1 F (36.2 C) 97.9 F (36.6 C)  TempSrc: Axillary Axillary Axillary Axillary  SpO2:   99% 94%  Weight:      Height:        Intake/Output Summary (Last 24 hours) at 10/13/2019 1500 Last data filed at 10/13/2019 0419 Gross per 24 hour  Intake --  Output 1100 ml  Net -1100 ml   Filed Weights   10/10/19 1828  Weight: 59.9 kg    Examination: General appearance: Elderly adult female, does not open eyes to exam, grimaces occasionally to touch but does not follow commands.   HEENT: Eyes closed, lids and lashes normal. No nasal deformity, discharge, epistaxis.  Large,  very large left parotid mass, lips normal, does not open mouth to command  Skin: Warm and dry.  no suspicious rashes or lesions. Cardiac: Tachycardic, regular, nl S1-S2, no murmurs appreciated.  Capillary refill is brisk.  JVP not visible.  No LE edema.  Radial pulses 2+ and symmetric. Respiratory: Normal respiratory rate and rhythm.  CTAB without rales or wheezes. Abdomen: Abdomen soft.  Mild grimace to palpation, nonfocal, voluntary guarding noted  MSK: No deformities or effusions.  Diffuse loss of subcutaneous muscle mass and fat. Neuro:  Eyes closed, does not open eyes to touch, does not follow commands, no spontaneous movements or verbalizations.    Psych: Unable to assess   Data Reviewed: I have personally reviewed following labs and imaging studies:  CBC: Recent Labs  Lab 10/10/19 1849 10/11/19 0443  WBC 6.8 5.6  HGB 11.5* 10.5*  HCT 35.6* 32.3*  MCV 92.5 90.5  PLT 281 [id number]   Basic Metabolic Panel: Recent Labs  Lab 10/10/19 1849 10/11/19 0443 10/12/19 0334  NA 145 143 141  K 5.0 4.0 3.9  CL 112* 112* 109  CO2 25 25 24   GLUCOSE 112* 91 90  BUN 36* 31* 31*  CREATININE 0.97 0.87 0.80  CALCIUM 8.9 8.3* 8.2*   GFR: Estimated Creatinine Clearance: 44.6 mL/min (by C-G formula based on SCr of 0.8 mg/dL). Liver Function Tests: No results for input(s): AST, ALT, ALKPHOS, BILITOT, PROT, ALBUMIN in the last 168 hours. No results for input(s): LIPASE, AMYLASE in the last 168 hours. No results for input(s): AMMONIA in the last 168 hours. Coagulation Profile: No results for input(s): INR, PROTIME in the last 168 hours. Cardiac Enzymes: No results for input(s): CKTOTAL, CKMB, CKMBINDEX, TROPONINI in the last 168 hours. BNP (last 3 results) No results for input(s): PROBNP in the last 8760 hours. HbA1C: No results for input(s): HGBA1C in the last 72 hours. CBG: No results for input(s): GLUCAP in the last 168 hours. Lipid Profile: No results for input(s): CHOL, HDL, LDLCALC, TRIG, CHOLHDL, LDLDIRECT in the last 72 hours. Thyroid Function Tests: No results for input(s): TSH, T4TOTAL, FREET4, T3FREE, THYROIDAB in the last 72 hours. Anemia Panel: No results for input(s): VITAMINB12, FOLATE, FERRITIN, TIBC, IRON, RETICCTPCT in the last 72 hours. Urine analysis:    Component Value Date/Time   COLORURINE YELLOW (A) 10/10/2019 1849   APPEARANCEUR HAZY (A) 10/10/2019 1849   LABSPEC 1.023 10/10/2019 1849   PHURINE 5.0 10/10/2019 1849   GLUCOSEU NEGATIVE 10/10/2019 1849   HGBUR NEGATIVE 10/10/2019 1849    BILIRUBINUR NEGATIVE 10/10/2019 1849   [place] 10/10/2019 1849   PROTEINUR NEGATIVE 10/10/2019 1849   UROBILINOGEN 0.2 05/21/2010 1825   NITRITE POSITIVE (A) 10/10/2019 1849   LEUKOCYTESUR TRACE (A) 10/10/2019 1849   Sepsis Labs: @LABRCNTIP (procalcitonin:4,lacticacidven:4)  ) Recent Results (from the past 240 hour(s))  SARS Coronavirus 2 by RT PCR (hospital order, performed in [place] hospital lab) Nasopharyngeal Nasopharyngeal Swab     Status: None   Collection Time: 10/10/19  6:24 PM   Specimen: Nasopharyngeal Swab  Result Value Ref Range Status   SARS Coronavirus 2 NEGATIVE NEGATIVE Final    Comment: (NOTE) SARS-CoV-2 target nucleic acids are NOT DETECTED. The SARS-CoV-2 RNA is generally detectable in upper and lower respiratory specimens during the acute phase of infection. The lowest concentration of SARS-CoV-2 viral copies this assay can detect is 250 copies / mL. A negative result does not preclude SARS-CoV-2 infection and should not be used as the sole basis for treatment  or other Sheila West management decisions.  A negative result may occur with improper specimen collection / handling, submission of specimen other than nasopharyngeal swab, presence of viral mutation(s) within the areas targeted by this assay, and inadequate number of viral copies (<250 copies / mL). A negative result must be combined with clinical observations, Sheila West history, and epidemiological information. Fact Sheet for Patients:   StrictlyIdeas.no Fact Sheet for Healthcare Providers: BankingDealers.co.za This test is not yet approved or cleared  by the [place] FDA and has been authorized for detection and/or diagnosis of SARS-CoV-2 by FDA under an Emergency Use Authorization (EUA).  This EUA will remain in effect (meaning this test can be used) for the duration of the COVID-19 declaration under Section 564(b)(1) of the Act, 21  U.S.C. section 360bbb-3(b)(1), unless the authorization is terminated or revoked sooner. Performed at [facility], [address] [place], [place] [address]   Urine Culture     Status: Abnormal   Collection Time: 10/10/19  6:49 PM   Specimen: Urine, Clean Catch  Result Value Ref Range Status   Specimen Description   Final    URINE, CLEAN CATCH Performed at [facility], [address] [place], [place] [address]    Special Requests   Final    NONE Performed at [facility], [place], [place] [address]    Culture (A)  Final    80,000 COLONIES/mL ESCHERICHIA COLI >=100,000 COLONIES/mL VIRIDANS STREPTOCOCCUS Standardized susceptibility testing for this organism is not available. Performed at [facility], [place] [address], [place], [place] [address]    Report Status 10/12/2019 FINAL  Final   Organism ID, Bacteria ESCHERICHIA COLI (A)  Final      Susceptibility   Escherichia coli - MIC*    AMPICILLIN 8 SENSITIVE Sensitive     CEFAZOLIN <=4 SENSITIVE Sensitive     CEFTRIAXONE <=1 SENSITIVE Sensitive     CIPROFLOXACIN <=0.25 SENSITIVE Sensitive     GENTAMICIN <=1 SENSITIVE Sensitive     IMIPENEM <=0.25 SENSITIVE Sensitive     NITROFURANTOIN <=16 SENSITIVE Sensitive     TRIMETH/SULFA <=20 SENSITIVE Sensitive     AMPICILLIN/SULBACTAM <=2 SENSITIVE Sensitive     PIP/TAZO <=4 SENSITIVE Sensitive     * 80,000 COLONIES/mL ESCHERICHIA COLI         Radiology Studies: CT SOFT TISSUE NECK W CONTRAST  Result Date: 10/11/2019 CLINICAL DATA:  Parotid mass.  Chronic enlargement.  Dementia. EXAM: CT NECK WITH CONTRAST TECHNIQUE: Multidetector CT imaging of the neck was performed using the standard protocol following the bolus administration of intravenous contrast. CONTRAST:  77mL OMNIPAQUE IOHEXOL 300 MG/ML  SOLN COMPARISON:  09/02/2019 CT.  07/17/2019 MRI. FINDINGS: Pharynx and larynx: No mucosal or submucosal lesion. Salivary glands:  Submandibular glands are normal. Right parotid gland is normal. There is been marked progression of a pathologic process of the left parotid gland most consistent with a parotid malignancy. Tumor mass now measures up to 3 x 5 cm in size and contains multiple areas of internal low density presumed represent necrosis. Inflammatory disease could less likely have this appearance. Thyroid: Normal Lymph nodes: Enlarged lymph node just inferior to the left parotid gland with diameter up to 1.4 cm with internal low density consistent with metastatic involvement. Vascular: Atherosclerotic calcification at the carotid bifurcations, not evaluated in detail using this technique. Limited intracranial: Negative as included. Visualized orbits: Negative Mastoids and visualized paranasal sinuses: Clear Skeleton: Chronic spondylosis and facet arthropathy. Upper chest:  Negative Other: None IMPRESSION: Progression of a left parotid process most consistent with a rapidly growing parotid malignancy. Mass lesion measures up to 3 x 5 cm and contains areas of internal necrosis/low-density. Lesion involves both the deep and superficial lobes. Abnormal lymph node just inferior to the parotid gland consistent with metastatic involvement. Infectious inflammatory disease could have this appearance but would be less likely. Electronically Signed   By: Nelson Chimes M.D.   On: 10/11/2019 16:46        Scheduled Meds: . sodium chloride flush  10-40 mL Intracatheter Q12H  . sodium chloride flush  3 mL Intravenous Q12H   Continuous Infusions:   LOS: 3 days    Time spent: 25 minutes    Edwin Dada, MD [facility] 10/13/2019, 3:00 PM     Please page though [place] or Epic secure chat:  For [employer], Adult nurse

## 2019-10-13 NOTE — Progress Notes (Signed)
  Speech Language Pathology Treatment: Dysphagia  Patient Details Name: Sheila West MRN: [id number] DOB: [birth year] Today's Date: 10/13/2019 Time: [id number] SLP Time Calculation (min) (ACUTE ONLY): 42 min  Assessment / Plan / Recommendation Clinical Impression  Pt seen for ongoing assessment of swallowing and trials to assess safety w/ oral intake, dysphagia diet. Noted Oncology note from yesterday re: the Left Parotid mass - an aggressive rapidly enlarging malignant parotid mass. At this session today, pt presents w/ increased drowsiness, agitated/defensive behaviors when being positioning upright in bed for oral intake/assessment, and little to no verbal responses to engage in environment or w/ SLP given Max verbal/tactile stim - this is further reduced from yesterday. Noted Left eye red, watery. Continued Left labial/facial deviation.  During oral care, pt exhibited increased oral defensiveness and decreased awareness to purpose of task. Noted min lingual reaction w/ lingual bunching to oral swabs introduced; often closed lips not allowing oral care. Noted an auditory, involuntary swallow w/ min lingual protrusion to the stim of oral assessment by SLP. When given verbal/tactile stim of spoon w/ Nectar liquids to lips, pt exhibited minimal reaction and No lingual licking of lips. No mouth opening to accept po trial noted. Noted another involuntary audible swallow, slightly wet - unsure if in response to saliva mixed w/ a trace amount of the Nectar liquid. No further trials were given d/t poor awareness for po tasks, dysphagia, and increased risk for aspiration and pulmonary decline.  Recommend strict NPO status d/t increased risk for aspiration and pulmonary decline. MD and NSG/CM consulted post session re: pt's presentation and need for meds given via alternative means. Recommend frequent oral care for hygiene and stimulation of swallowing. ST services will continue to follow pt's status next 1-2  days for change of status; POC. Recommend f/u w/ Palliative Care services for [place].     HPI HPI: Pt is a 86 y.o. Caucasian female with a known history of Intracranial meningioma, GERD, hypertension, dyslipidemia, asthma and Dementia who presented to the emergency room with acute onset of altered mental status with decreased responsiveness.  No reported fever or chills.  She has been incontinent.  Pt admitted from [facility] after unwitness fall.  Chest x-ray showed no acute cardiopulmonary disease.  Noncontrasted head CT scan revealed stable calcified frontal meningioma without mass-effect with no acute intracranial process.  The patient had a previous Left parotid mass which was noted during earlier ED admits this year.  Pt has had recent Falls at her NH and per ED chart notes.  Per recent MRI 07/2019: "At least two T2 hyperintense masses within the left parotid gland suspicious for primary parotid neoplasms, measuring up to 12 mm.".  Pt was referred to ENT when first noted per chart notes.        SLP Plan  Continue with current plan of care       Recommendations  Diet recommendations: NPO Medication Administration: Via alternative means(npo)                General recommendations: (Palliative Care f/u for [place]) Oral Care Recommendations: Oral care QID;Staff/trained caregiver to provide oral care Follow up Recommendations: (TBD) SLP Visit Diagnosis: Dysphagia, oropharyngeal phase (R13.12)(baseline Dementia) Plan: Continue with current plan of care       [place], Sheila West, CCC-SLP Lawrence Roldan 10/13/2019, 11:49 AM

## 2019-10-13 NOTE — TOC Progression Note (Signed)
Transition of Care [facility]) - Progression Note    Patient Details  Name: Sheila West MRN: [id number] Date of Birth: 1929/08/19  Transition of Care [facility]) CM/SW Contact  Shelbie Hutching, RN Phone Number: 10/13/2019, 3:52 PM  Clinical Narrative:    Patient's HCPOA has decided for patient to go to residential hospice.  Margaretmary Eddy, with [facility] given referral.  There are no beds available today.    Expected Discharge Plan: [place] Barriers to Discharge: Hospice Bed not available  Expected Discharge Plan and Services Expected Discharge Plan: [place]   Discharge Planning Services: CM Consult Post Acute Care Choice: Hospice Living arrangements for the past 2 months: Assisted Living Facility                                       Social Determinants of Health (SDOH) Interventions    Readmission Risk Interventions No flowsheet data found.

## 2019-10-13 NOTE — Consult Note (Addendum)
Consultation Note Date: 10/13/2019   Patient Name: Sheila West  DOB: 11/10/1929  MRN: [id number]  Age / Sex: 86 y.o., female  PCP: [facility] Referring Physician: Edwin Dada, *  Reason for Consultation: Establishing goals of care  HPI/Patient Profile: Sheila West is a 86 y.o. female with dementia and an enlarging left parotid mass who was admitted through the ER with altered mental status.  Clinical Assessment and Goals of Care: Patient is resting in bed with eyes closed. Her breathing is even and unlabored. She does not open eyes to voice at this time. No family at bedside.   Notes reviewed. Appreciative of Oncology evaluation.   Spoke with HPOA Sheila West. HPOA papers currently scanned into [facility]. He states he and his wife have known Sheila West since they were teenagers. He states she was widowed around 40. She did not have children. She had 1 sister who is deceased, and a niece who due to congential cognitive processes is a ward of the state herself and is unable to make decisions for Sheila West.    Sheila West states she has always been an active person, who was independent and strong willed. Changes in her mental status were noted in 2015 and a steady progression of dementia was noted. She was moved to an assisted living facility around 1 year ago. He advises the move came because they had been helping her around the house, and Sheila West's wife with bathing, but then tried to hire a company to come in and help. Sheila West did not like strangers in her home and would only allow Sheila West and his wife. He also advises someone came to visit she had not seen in many years and she left with them to go somewhere which was an unsafe decision.   We discussed her diagnosis, prognosis, GOC, EOL wishes disposition and options.  A detailed discussion was had today regarding advanced directives.   Concepts specific to code status, artifical feeding and hydration, IV antibiotics and rehospitalization were discussed.  The difference between an aggressive medical intervention path and a comfort care path was discussed.  Values and goals of care important to patient and family were attempted to be elicited.  Discussed limitations of medical interventions to prolong quality of life in some situations and discussed the concept of human mortality.  He discusses in great detail her current status and updates by the staff. He has a great understanding of her diagnoses and prognosis. He states he would like to transition to comfort care and to have her moved to the hospice facility.    I completed a MOST form today in [facility] and the signed original was placed in the chart. A photocopy was placed in the chart to be scanned into EMR. The patient's HPOA outlined their wishes for the following treatment decisions:  Cardiopulmonary Resuscitation: Do Not Attempt Resuscitation (DNR/No CPR)  Medical Interventions: Comfort Measures: Keep clean, warm, and dry. Use medication by any route, positioning, wound care, and other measures to relieve pain  and suffering. Use oxygen, suction and manual treatment of airway obstruction as needed for comfort. Do not transfer to the hospital unless comfort needs cannot be met in current location.  Antibiotics: No antibiotics (use other measures to relieve symptoms)  IV Fluids: No IV fluids (provide other measures to ensure comfort)  Feeding Tube: No feeding tube      SUMMARY OF RECOMMENDATIONS    Shift to comfort based care and transfer to hospice facility.   Medications initiated for comfort care.    Prognosis:   < 2 weeks Rapidly growing left parotid mass. No PO intake.        Primary Diagnoses: Present on Admission: . UTI (urinary tract infection) . Acute metabolic encephalopathy . Dementia with behavioral disturbance ([place]) . GERD (gastroesophageal reflux  disease) . Hyperlipidemia . Hypertension . Meningioma ([place])   I have reviewed the medical record, interviewed the patient and family, and examined the patient. The following aspects are pertinent.  Past Medical History:  Diagnosis Date  . Anemia   . Arthritis    "left shoulder; left hip" (02/13/2015)  . Asthma   . GERD (gastroesophageal reflux disease)   . Heart murmur dx'd 02/13/2015  . Hypercholesterolemia   . Hypertension   . Intracranial meningioma ([place])   . Melanoma ([place])    "I've had multile melanomas removed"  . Memory loss    Social History   Socioeconomic History  . Marital status: Widowed    Spouse name: Not on file  . Number of children: 0  . Years of education: 55  . Highest education level: High school graduate  Occupational History  . Occupation: Retired  Tobacco Use  . Smoking status: Never Smoker  . Smokeless tobacco: Never Used  Substance and Sexual Activity  . Alcohol use: No  . Drug use: No  . Sexual activity: Never  Other Topics Concern  . Not on file  Social History Narrative   Lives at home alone.   Right-handed.   2-3 cups caffeine daily.   She is cared for by her close family friends and POA, Sheila West and Sheila West.  They live within 2-3 miles and check on her daily.   Social Determinants of Health   Financial Resource Strain:   . Difficulty of Paying Living Expenses:   Food Insecurity:   . Worried About Charity fundraiser in the Last Year:   . Arboriculturist in the Last Year:   Transportation Needs:   . Film/video editor (Medical):   Marland Kitchen Lack of Transportation (Non-Medical):   Physical Activity:   . Days of Exercise per Week:   . Minutes of Exercise per Session:   Stress:   . Feeling of Stress :   Social Connections:   . Frequency of Communication with Friends and Family:   . Frequency of Social Gatherings with Friends and Family:   . Attends Religious Services:   . Active Member of Clubs or Organizations:   . Attends  Archivist Meetings:   Marland Kitchen Marital Status:    Family History  Problem Relation Age of Onset  . Cancer Sister   . Cancer Sister   . Dementia Sister    Scheduled Meds: . sodium chloride flush  10-40 mL Intracatheter Q12H  . sodium chloride flush  3 mL Intravenous Q12H   Continuous Infusions: PRN Meds:.alum & mag hydroxide-simeth, antiseptic oral rinse, glycopyrrolate **OR** glycopyrrolate **OR** glycopyrrolate, haloperidol **OR** haloperidol **OR** haloperidol lactate, LORazepam **OR** LORazepam **OR** LORazepam,  morphine injection, ondansetron **OR** ondansetron (ZOFRAN) IV, polyvinyl alcohol, sodium chloride flush, sodium chloride flush Medications Prior to Admission:  Prior to Admission medications   Medication Sig Start Date End Date Taking? Authorizing Provider  acetaminophen (TYLENOL) 500 MG tablet Take 500 mg by mouth every 4 (four) hours as needed for mild pain or fever.   Yes [provider]  alum & mag hydroxide-simeth (MINTOX) 200-200-20 MG/5ML suspension Take 30 mLs by mouth 4 (four) times daily as needed for indigestion or heartburn.   Yes [provider]  donepezil (ARICEPT) 10 MG tablet Take 1 tablet (10 mg total) by mouth at bedtime. 09/17/18  Yes Marcial Pacas, MD  guaiFENesin (ROBITUSSIN) 100 MG/5ML liquid Take 200 mg by mouth every 6 (six) hours as needed for cough.   Yes [provider]  ketoconazole (NIZORAL) 2 % shampoo Apply 1 application topically 2 (two) times a week.   Yes [provider]  loperamide (IMODIUM) 2 MG capsule Take 2 mg by mouth as needed for diarrhea or loose stools. (max 8 doses in 24 hours)   Yes [provider]  magnesium hydroxide (MILK OF MAGNESIA) 400 MG/5ML suspension Take 30 mLs by mouth at bedtime as needed for mild constipation.   Yes [provider]  meloxicam (MOBIC) 7.5 MG tablet Take 7.5 mg by mouth daily.   Yes [provider]  neomycin-bacitracin-polymyxin (NEOSPORIN)  ointment Apply 1 application topically as needed for wound care.   Yes [provider]  OLANZapine (ZYPREXA) 5 MG tablet Take 7.5 mg by mouth daily with supper.   Yes [provider]  omeprazole (PRILOSEC) 20 MG capsule Take 20 mg by mouth daily.   Yes [provider]   No Known Allergies Review of Systems  Unable to perform ROS   Physical Exam Constitutional:      Comments: Eyes closed.   Pulmonary:     Effort: Pulmonary effort is normal.     Vital Signs: BP (!) 141/77 (BP Location: Right Arm)   Pulse 61   Temp 97.9 F (36.6 C) (Axillary)   Resp 17   Ht '5\' 5"'$  (1.100 m)   Wt 59.9 kg   SpO2 94%   BMI 21.97 kg/m  Pain Scale: 0-10   Pain Score: 0-No pain   SpO2: SpO2: 94 % O2 Device:SpO2: 94 % O2 Flow Rate: .   IO: Intake/output summary:   Intake/Output Summary (Last 24 hours) at 10/13/2019 1234 Last data filed at 10/13/2019 0419 Gross per 24 hour  Intake --  Output 1100 ml  Net -1100 ml    LBM:   Baseline Weight: Weight: 59.9 kg Most recent weight: Weight: 59.9 kg     Palliative Assessment/Data:     Time In: 11:50 Time Out: 12:40 Time Total: 50 min Greater than 50%  of this time was spent counseling and coordinating care related to the above assessment and plan.  Signed by: Asencion Gowda, NP   Please contact Palliative Medicine Team phone at [phone] for questions and concerns.  For individual provider: See Shea Evans

## 2019-10-13 NOTE — Progress Notes (Signed)
[facility]) Hospital Liaison RN note  Received request from [facility] Doran Clay, RN for family interest in [facility]. Chart reviewed. Spoke to [name]) to acknowledge referral.  Unfortunately, [facility] is not able to offer a room today. Danise Edge with [facility] aware [place] liaison will follow up tomorrow or sooner if room becomes available.  Please do not hesitate to call with questions or concerns.  Thank you. Margaretmary Eddy, BSN, RN [facility] Liaison [phone]

## 2019-10-14 DIAGNOSIS — Z515 Encounter for palliative care: Secondary | ICD-10-CM

## 2019-10-14 MED ORDER — SODIUM CHLORIDE 0.9% FLUSH: 10.0000 mL | INTRAVENOUS | Status: AC | PRN

## 2019-10-14 MED ORDER — SODIUM CHLORIDE 0.9% FLUSH: 10.0000 mL | Freq: Two times a day (BID) | INTRAVENOUS | Status: AC

## 2019-10-14 MED ORDER — HALOPERIDOL 0.5 MG PO TABS: 0.5000 mg | ORAL_TABLET | ORAL | Status: AC | PRN

## 2019-10-14 MED ORDER — BIOTENE DRY MOUTH MT LIQD: 15.0000 mL | OROMUCOSAL | Status: AC | PRN

## 2019-10-14 MED ORDER — POLYVINYL ALCOHOL 1.4 % OP SOLN: 1.0000 [drp] | Freq: Four times a day (QID) | OPHTHALMIC | 0 refills | Status: AC | PRN

## 2019-10-14 MED ORDER — LORAZEPAM 1 MG PO TABS: 1.0000 mg | ORAL_TABLET | ORAL | 0 refills | Status: AC | PRN

## 2019-10-14 MED ORDER — SODIUM CHLORIDE 0.9% FLUSH: 3.0000 mL | Freq: Two times a day (BID) | INTRAVENOUS | Status: AC

## 2019-10-14 MED ORDER — ALUM & MAG HYDROXIDE-SIMETH 200-200-20 MG/5ML PO SUSP: 30.0000 mL | Freq: Four times a day (QID) | ORAL | 0 refills | Status: AC | PRN

## 2019-10-14 MED ORDER — GLYCOPYRROLATE 1 MG PO TABS: 1.0000 mg | ORAL_TABLET | ORAL | Status: AC | PRN

## 2019-10-14 MED ORDER — HALOPERIDOL LACTATE 2 MG/ML PO CONC: 0.5000 mg | ORAL | 0 refills | Status: AC | PRN

## 2019-10-14 MED ORDER — MORPHINE SULFATE (PF) 2 MG/ML IV SOLN: 1.0000 mg | INTRAVENOUS | 0 refills | Status: AC | PRN

## 2019-10-14 MED ORDER — ONDANSETRON 4 MG PO TBDP: 4.0000 mg | ORAL_TABLET | Freq: Four times a day (QID) | ORAL | 0 refills | Status: AC | PRN

## 2019-10-14 MED ORDER — SODIUM CHLORIDE 0.9% FLUSH: 3.0000 mL | INTRAVENOUS | Status: AC | PRN

## 2019-10-14 NOTE — Care Management Important Message (Signed)
Important Message  Patient Details  Name: Sheila West MRN: [id number] Date of Birth: 04-10-1930   Medicare Important Message Given:  Yes  Left for POA in pt's room   Simon Aaberg, Gardiner Rhyme, LCSW 10/14/2019, 10:22 AM

## 2019-10-14 NOTE — TOC Transition Note (Signed)
Transition of Care [facility]) - CM/SW Discharge Note   Patient Details  Name: KYLIN BORSA MRN: [id number] Date of Birth: [birth year]  Transition of Care [facility]) CM/SW Contact:  Elease Hashimoto, LCSW Phone Number: 10/14/2019, 9:54 AM   Clinical Narrative: Bed available at [facility], bedside RN and MD aware, once paperwork complete will work on transfer. DC packet in chart. [facility] to contact bedside RN when EMS called. HCPOA aware of this. No further follow due to transfer today.    Final next level of care: [place] Barriers to Discharge: Barriers Resolved   Patient Goals and CMS Choice Patient states their goals for this hospitalization and ongoing recovery are:: patient unable to verbalize goals- advanced dementia CMS Medicare.gov Compare Post Acute Care list provided to:: Patient Represenative (must comment) Choice offered to / list presented to : [place] / [place]  Discharge Placement              Patient chooses bed at: Other - please specify in the comment section below:(Hospice Home) Patient to be transferred to facility by: EMS Name of family member notified: POA Patient and family notified of of transfer: 10/14/19  Discharge Plan and Services   Discharge Planning Services: CM Consult Post Acute Care Choice: Hospice                               Social Determinants of Health (SDOH) Interventions     Readmission Risk Interventions No flowsheet data found.

## 2019-10-14 NOTE — Discharge Summary (Signed)
Physician Discharge Summary  CHAKIRA PERONI [id number] DOB: [birth year] DOA: 10/10/2019  PCP: Orvis Brill, [facility]  Admit date: 10/10/2019 Discharge date: 10/14/2019  Admitted From: ALF  Disposition:  [place]: N/A  Equipment/Devices: N/A  Discharge Condition: To Hospice, likely days to week life expectancy  CODE STATUS: DO NOT RESUSCITATE Diet recommendation: For comfort  Brief/Interim Summary: Mrs. Glime is a 86 y.o. F with HTN, dementia lives in ALF, and chronic parotid gland swelling who presented with AMS and decreased responsiveness.  In the ER BP elevated, labs unremarkable.  UA with pyuria.  CT head and neck showed malignancy with mass invading his facial nerve and lymph node involvement.          PRINCIPAL HOSPITAL DIAGNOSIS: Acute metabolic encephalopathy due to rapidly progressive parotid malignancy    Discharge Diagnoses:   Acute metabolic encephalopathy likely due to left parotid mass The patient was admitted and started on fluids and empiric antibiotics for UTI.  CT of the neck showed "marked progression of a pathologic process of the left parotid gland most consistent with a parotid malignancy ... 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    The patient remained minimally responsive, unable to take anything by mouth.  Decision-makers agreed the patient would not have wanted aggressive diagnostic or therapeutic efforts in the face of vanishingly small chances of meaningful recovery to prior  level of function.  Comfort measures were started, arrangements were made to transition to residential Hospice.    Hypertension  Dementia  Pyuria Asymptomatic bacteriuria UTI ruled out.            Discharge Instructions   Allergies as of 10/14/2019   No Known Allergies     Medication List    STOP taking these medications   acetaminophen 500 MG tablet Commonly known as: TYLENOL   donepezil 10 MG tablet Commonly known as: ARICEPT   guaiFENesin 100 MG/5ML liquid Commonly known as: ROBITUSSIN   ketoconazole 2 % shampoo Commonly known as: NIZORAL   loperamide 2 MG capsule Commonly known as: IMODIUM   magnesium hydroxide 400 MG/5ML suspension Commonly known as: MILK OF MAGNESIA   meloxicam 7.5 MG tablet Commonly known as: MOBIC   neomycin-bacitracin-polymyxin ointment Commonly known as: NEOSPORIN   OLANZapine 5 MG tablet Commonly known as: ZYPREXA   omeprazole 20 MG capsule Commonly known as: PRILOSEC     TAKE these medications   alum & mag hydroxide-simeth 200-200-20 MG/5ML suspension Commonly known as: Mintox Take 30 mLs by mouth 4 (four) times daily as needed for indigestion or heartburn.   antiseptic oral rinse Liqd Apply 15 mLs topically as needed for dry mouth.   glycopyrrolate 1 MG tablet Commonly known as: ROBINUL Take 1 tablet (1 mg total) by mouth every 4 (four) hours as needed (excessive secretions).   haloperidol 0.5 MG tablet Commonly known as: HALDOL Take 1 tablet (0.5 mg total) by mouth every 4 (four) hours as needed for agitation (or delirium).   haloperidol 2 MG/ML solution Commonly known as: HALDOL Place 0.3 mLs (0.6 mg total) under  the tongue every 4 (four) hours as needed for agitation (or delirium).   LORazepam 1 MG tablet Commonly known as: ATIVAN Take 1 tablet (1 mg total) by mouth every 4 (four) hours as needed for anxiety.   morphine 2 MG/ML injection Inject 0.5 mLs (1 mg total) into the vein every 2 (two) hours  as needed (or dyspnea).   ondansetron 4 MG disintegrating tablet Commonly known as: ZOFRAN-ODT Take 1 tablet (4 mg total) by mouth every 6 (six) hours as needed for nausea.   polyvinyl alcohol 1.4 % ophthalmic solution Commonly known as: LIQUIFILM TEARS Place 1 drop into both eyes 4 (four) times daily as needed for dry eyes.   sodium chloride flush 0.9 % Soln Commonly known as: NS 10-40 mLs by Intracatheter route every 12 (twelve) hours.   sodium chloride flush 0.9 % Soln Commonly known as: NS 10-40 mLs by Intracatheter route as needed (flush).   sodium chloride flush 0.9 % Soln Commonly known as: NS Inject 3 mLs into the vein every 12 (twelve) hours.   sodium chloride flush 0.9 % Soln Commonly known as: NS Inject 3 mLs into the vein as needed.       No Known Allergies  Consultations:  Palliative Care  Oncology   Procedures/Studies: CT Head Wo Contrast  Result Date: 10/10/2019 CLINICAL DATA:  Head trauma, headache EXAM: CT HEAD WITHOUT CONTRAST TECHNIQUE: Contiguous axial images were obtained from the base of the skull through the vertex without intravenous contrast. COMPARISON:  09/20/2019 FINDINGS: Brain: No acute infarct or hemorrhage. The lateral ventricles and midline structures are stable. Basal ganglia calcifications again noted. No acute extra-axial fluid collections. Calcified mass in the right frontal region consistent with meningioma. No mass effect. Vascular: No hyperdense vessel or unexpected calcification. Skull: Normal. Negative for fracture or focal lesion. Sinuses/Orbits: No acute finding. Other: The left parotid mass seen on prior CT is not as well appreciated on this exam due to slice selection. IMPRESSION: 1. Stable calcified right frontal meningioma without mass effect. 2. No acute intracranial process. 3. Please refer to prior CT face report 09/02/2019 describing a left parotid mass requiring nonemergent follow-up with CT neck with contrast.  Electronically Signed   By: Randa Ngo M.D.   On: 10/10/2019 19:27   CT Head Wo Contrast  Result Date: 09/20/2019 CLINICAL DATA:  Status post fall in a patient with dementia. Initial encounter. EXAM: CT HEAD WITHOUT CONTRAST CT CERVICAL SPINE WITHOUT CONTRAST TECHNIQUE: Multidetector CT imaging of the head and cervical spine was performed following the standard protocol without intravenous contrast. Multiplanar CT image reconstructions of the cervical spine were also generated. COMPARISON:  Head CT and brain MRI 07/17/2019. Head CT 09/02/2019. Cervical spine CT 07/02/2019. FINDINGS: CT HEAD FINDINGS Brain: No acute abnormality including hemorrhage, infarct, midline shift or abnormal extra-axial fluid collection is identified. Calcified meningioma over the right frontal convexities is unchanged. Chronic microvascular ischemic change and atrophy are also unchanged. Vascular: Atherosclerosis is noted. Skull: Intact.  No focal lesion. Sinuses/Orbits: Negative. Other: 2 cystic lesions are seen about the left parotid gland. The more anterior and superficial measures 1.7 x 1.6 cm compared to 0.6 cm in diameter on the 07/17/2019 exam. A second lesion immediately posterior to the body of the left mandible measures 1.3 cm in diameter compared to 0.8 cm on the 07/17/2019 exam. CT CERVICAL SPINE FINDINGS Alignment: 0.4 cm facet mediated anterolisthesis C7 on T1 is unchanged. Skull base and vertebrae: No acute fracture. No primary bone lesion or  focal pathologic process. Soft tissues and spinal canal: No prevertebral fluid or swelling. No visible canal hematoma. Disc levels: Multilevel facet arthropathy and degenerative disc disease are stable in appearance. Upper chest: Clear. Other: None. IMPRESSION: No acute abnormality head or cervical spine. Two cystic lesions about the right parotid gland have increased in size since 07/17/2019 and could be due to infectious or inflammatory change, necrotic lymph nodes or possibly  parotid neoplasm. Nonemergent contrast enhanced neck CT is recommended for further evaluation. Atrophy and chronic microvascular ischemic change. Right frontal meningioma. Cervical spondylosis. Electronically Signed   By: Inge Rise M.D.   On: 09/20/2019 15:09   CT SOFT TISSUE NECK W CONTRAST  Result Date: 10/11/2019 CLINICAL DATA:  Parotid mass.  Chronic enlargement.  Dementia. EXAM: CT NECK WITH CONTRAST TECHNIQUE: Multidetector CT imaging of the neck was performed using the standard protocol following the bolus administration of intravenous contrast. CONTRAST:  23mL OMNIPAQUE IOHEXOL 300 MG/ML  SOLN COMPARISON:  09/02/2019 CT.  07/17/2019 MRI. FINDINGS: Pharynx and larynx: No mucosal or submucosal lesion. Salivary glands: Submandibular glands are normal. Right parotid gland is normal. There is been marked progression of a pathologic process of the left parotid gland most consistent with a parotid malignancy. Tumor mass now measures up to 3 x 5 cm in size and contains multiple areas of internal low density presumed represent necrosis. Inflammatory disease could less likely have this appearance. Thyroid: Normal Lymph nodes: Enlarged lymph node just inferior to the left parotid gland with diameter up to 1.4 cm with internal low density consistent with metastatic involvement. Vascular: Atherosclerotic calcification at the carotid bifurcations, not evaluated in detail using this technique. Limited intracranial: Negative as included. Visualized orbits: Negative Mastoids and visualized paranasal sinuses: Clear Skeleton: Chronic spondylosis and facet arthropathy. Upper chest: Negative Other: None IMPRESSION: Progression of a left parotid process most consistent with a rapidly growing parotid malignancy. Mass lesion measures up to 3 x 5 cm and contains areas of internal necrosis/low-density. Lesion involves both the deep and superficial lobes. Abnormal lymph node just inferior to the parotid gland consistent  with metastatic involvement. Infectious inflammatory disease could have this appearance but would be less likely. Electronically Signed   By: Nelson Chimes M.D.   On: 10/11/2019 16:46   CT Cervical Spine Wo Contrast  Result Date: 10/10/2019 CLINICAL DATA:  Un witnessed fall, headache, dimension EXAM: CT CERVICAL SPINE WITHOUT CONTRAST TECHNIQUE: Multidetector CT imaging of the cervical spine was performed without intravenous contrast. Multiplanar CT image reconstructions were also generated. COMPARISON:  09/20/2019 FINDINGS: Alignment: Grossly normal anatomic alignment. Skull base and vertebrae: No acute displaced fractures. Soft tissues and spinal canal: No prevertebral fluid or swelling. No visible canal hematoma. Cystic structures within the left parotid gland are again identified, unchanged. Please refer to prior CT discussion recommending CT neck with contrast on a nonemergent basis for complete characterization. Disc levels: There is diffuse cervical spondylosis and facet hypertrophy most pronounced from C3 through C6. There is symmetrical neural foraminal narrowing at C3-4, C4-5, and C5-6. Upper chest: Airway is patent. Lung apices are clear. Other: Reconstructed images demonstrate no additional findings. IMPRESSION: 1. No acute cervical spine fracture. 2. Diffuse cervical spondylosis and facet hypertrophy. 3. Stable cystic structures within the left parotid gland. Please refer to prior CT discussion recommending CT neck with contrast on a nonemergent basis for complete characterization. Electronically Signed   By: Randa Ngo M.D.   On: 10/10/2019 19:30   CT Cervical Spine Wo Contrast  Result  Date: 09/20/2019 CLINICAL DATA:  Status post fall in a patient with dementia. Initial encounter. EXAM: CT HEAD WITHOUT CONTRAST CT CERVICAL SPINE WITHOUT CONTRAST TECHNIQUE: Multidetector CT imaging of the head and cervical spine was performed following the standard protocol without intravenous contrast.  Multiplanar CT image reconstructions of the cervical spine were also generated. COMPARISON:  Head CT and brain MRI 07/17/2019. Head CT 09/02/2019. Cervical spine CT 07/02/2019. FINDINGS: CT HEAD FINDINGS Brain: No acute abnormality including hemorrhage, infarct, midline shift or abnormal extra-axial fluid collection is identified. Calcified meningioma over the right frontal convexities is unchanged. Chronic microvascular ischemic change and atrophy are also unchanged. Vascular: Atherosclerosis is noted. Skull: Intact.  No focal lesion. Sinuses/Orbits: Negative. Other: 2 cystic lesions are seen about the left parotid gland. The more anterior and superficial measures 1.7 x 1.6 cm compared to 0.6 cm in diameter on the 07/17/2019 exam. A second lesion immediately posterior to the body of the left mandible measures 1.3 cm in diameter compared to 0.8 cm on the 07/17/2019 exam. CT CERVICAL SPINE FINDINGS Alignment: 0.4 cm facet mediated anterolisthesis C7 on T1 is unchanged. Skull base and vertebrae: No acute fracture. No primary bone lesion or focal pathologic process. Soft tissues and spinal canal: No prevertebral fluid or swelling. No visible canal hematoma. Disc levels: Multilevel facet arthropathy and degenerative disc disease are stable in appearance. Upper chest: Clear. Other: None. IMPRESSION: No acute abnormality head or cervical spine. Two cystic lesions about the right parotid gland have increased in size since 07/17/2019 and could be due to infectious or inflammatory change, necrotic lymph nodes or possibly parotid neoplasm. Nonemergent contrast enhanced neck CT is recommended for further evaluation. Atrophy and chronic microvascular ischemic change. Right frontal meningioma. Cervical spondylosis. Electronically Signed   By: Inge Rise M.D.   On: 09/20/2019 15:09   DG Chest Portable 1 View  Result Date: 10/10/2019 CLINICAL DATA:  86 year old female with altered sensory. EXAM: PORTABLE CHEST 1 VIEW  COMPARISON:  Chest radiograph dated 07/17/2019. FINDINGS: No focal consolidation, pleural effusion or pneumothorax. Probable right lung base atelectasis. Calcified granuloma in the left upper lung field the cardiac silhouette is within limits. Atherosclerotic calcification of the aorta. Degenerative changes of the spine and shoulders. No acute osseous pathology. IMPRESSION: No active cardiopulmonary disease. Electronically Signed   By: Anner Crete M.D.   On: 10/10/2019 19:38       Subjective: Patient agitated overnight.  No fever.  No respiratory distress.  Still too disoriented, somnolent to follow commands.  No oral intake tolerated at all.   Discharge Exam: Vitals:   10/13/19 2353 10/14/19 0731  BP: 123/77 (!) 173/80  Pulse: 65   Resp:  18  Temp: 98.5 F (36.9 C) 98.1 F (36.7 C)  SpO2: 97% 94%   Vitals:   10/12/19 2322 10/13/19 1029 10/13/19 2353 10/14/19 0731  BP: 124/61 (!) 141/77 123/77 (!) 173/80  Pulse: (!) 51 61 65   Resp: 17 17  18   Temp: (!) 97.1 F (36.2 C) 97.9 F (36.6 C) 98.5 F (36.9 C) 98.1 F (36.7 C)  TempSrc: Axillary Axillary Axillary Oral  SpO2: 99% 94% 97% 94%  Weight:      Height:        General: Patient is sleeping, sluggish, barely opens eyes to sternal rub.   Cardiovascular: RRR, no murmurs appreciated.   No LE edema.  Respiratory: Increased respiratory rate and rhythm.  CTAB without rales or wheezes. Shallow, weak effort. Abdominal: Abdomen soft and without grimace.  No distension or HSM.   Neuro/Psych: Obtunded, does not follow commands.   The results of significant diagnostics from this hospitalization (including imaging, microbiology, ancillary and laboratory) are listed below for reference.     Microbiology: Recent Results (from the past 240 hour(s))  SARS Coronavirus 2 by RT PCR (hospital order, performed in [facility] hospital lab) Nasopharyngeal Nasopharyngeal Swab     Status: None   Collection Time: 10/10/19  6:24 PM    Specimen: Nasopharyngeal Swab  Result Value Ref Range Status   SARS Coronavirus 2 NEGATIVE NEGATIVE Final    Comment: (NOTE) SARS-CoV-2 target nucleic acids are NOT DETECTED. The SARS-CoV-2 RNA is generally detectable in upper and lower respiratory specimens during the acute phase of infection. The lowest concentration of SARS-CoV-2 viral copies this assay can detect is 250 copies / mL. A negative result does not preclude SARS-CoV-2 infection and should not be used as the sole basis for treatment or other patient management decisions.  A negative result may occur with improper specimen collection / handling, submission of specimen other than nasopharyngeal swab, presence of viral mutation(s) within the areas targeted by this assay, and inadequate number of viral copies (<250 copies / mL). A negative result must be combined with clinical observations, patient history, and epidemiological information. Fact Sheet for Patients:   StrictlyIdeas.no Fact Sheet for Healthcare Providers: BankingDealers.co.za This test is not yet approved or cleared  by the [place] FDA and has been authorized for detection and/or diagnosis of SARS-CoV-2 by FDA under an Emergency Use Authorization (EUA).  This EUA will remain in effect (meaning this test can be used) for the duration of the COVID-19 declaration under Section 564(b)(1) of the Act, 21 U.S.C. section 360bbb-3(b)(1), unless the authorization is terminated or revoked sooner. Performed at [facility], [address] [place], [place] [address]   Urine Culture     Status: Abnormal   Collection Time: 10/10/19  6:49 PM   Specimen: Urine, Clean Catch  Result Value Ref Range Status   Specimen Description   Final    URINE, CLEAN CATCH Performed at [facility], [address], [place], [place] [address]    Special Requests   Final    NONE Performed at [facility],  [place], [place] [address]    Culture (A)  Final    80,000 COLONIES/mL ESCHERICHIA COLI >=100,000 COLONIES/mL VIRIDANS STREPTOCOCCUS Standardized susceptibility testing for this organism is not available. Performed at [facility], [place] [address], [place], [place] [address]    Report Status 10/12/2019 FINAL  Final   Organism ID, Bacteria ESCHERICHIA COLI (A)  Final      Susceptibility   Escherichia coli - MIC*    AMPICILLIN 8 SENSITIVE Sensitive     CEFAZOLIN <=4 SENSITIVE Sensitive     CEFTRIAXONE <=1 SENSITIVE Sensitive     CIPROFLOXACIN <=0.25 SENSITIVE Sensitive     GENTAMICIN <=1 SENSITIVE Sensitive     IMIPENEM <=0.25 SENSITIVE Sensitive     NITROFURANTOIN <=16 SENSITIVE Sensitive     TRIMETH/SULFA <=20 SENSITIVE Sensitive     AMPICILLIN/SULBACTAM <=2 SENSITIVE Sensitive     PIP/TAZO <=4 SENSITIVE Sensitive     * 80,000 COLONIES/mL ESCHERICHIA COLI     Labs: BNP (last 3 results) No results for input(s): BNP in the last 8760 hours. Basic Metabolic Panel: Recent Labs  Lab 10/10/19 1849 10/11/19 0443 10/12/19 0334  NA 145 143 141  K 5.0 4.0 3.9  CL 112*  112* 109  CO2 25 25 24   GLUCOSE 112* 91 90  BUN 36* 31* 31*  CREATININE 0.97 0.87 0.80  CALCIUM 8.9 8.3* 8.2*   Liver Function Tests: No results for input(s): AST, ALT, ALKPHOS, BILITOT, PROT, ALBUMIN in the last 168 hours. No results for input(s): LIPASE, AMYLASE in the last 168 hours. No results for input(s): AMMONIA in the last 168 hours. CBC: Recent Labs  Lab 10/10/19 1849 10/11/19 0443  WBC 6.8 5.6  HGB 11.5* 10.5*  HCT 35.6* 32.3*  MCV 92.5 90.5  PLT 281 273   Cardiac Enzymes: No results for input(s): CKTOTAL, CKMB, CKMBINDEX, TROPONINI in the last 168 hours. BNP: Invalid input(s): POCBNP CBG: No results for input(s): GLUCAP in the last 168 hours. D-Dimer No results for input(s): DDIMER in the last 72 hours. Hgb A1c No results for input(s): HGBA1C in the last 72  hours. Lipid Profile No results for input(s): CHOL, HDL, LDLCALC, TRIG, CHOLHDL, LDLDIRECT in the last 72 hours. Thyroid function studies No results for input(s): TSH, T4TOTAL, T3FREE, THYROIDAB in the last 72 hours.  Invalid input(s): FREET3 Anemia work up No results for input(s): VITAMINB12, FOLATE, FERRITIN, TIBC, IRON, RETICCTPCT in the last 72 hours. Urinalysis    Component Value Date/Time   COLORURINE YELLOW (A) 10/10/2019 1849   APPEARANCEUR HAZY (A) 10/10/2019 1849   LABSPEC 1.023 10/10/2019 1849   PHURINE 5.0 10/10/2019 1849   GLUCOSEU NEGATIVE 10/10/2019 1849   HGBUR NEGATIVE 10/10/2019 [place] 10/10/2019 1849   KETONESUR NEGATIVE 10/10/2019 1849   PROTEINUR NEGATIVE 10/10/2019 1849   UROBILINOGEN 0.2 05/21/2010 1825   NITRITE POSITIVE (A) 10/10/2019 1849   LEUKOCYTESUR TRACE (A) 10/10/2019 1849   Sepsis Labs Invalid input(s): PROCALCITONIN,  WBC,  LACTICIDVEN Microbiology Recent Results (from the past 240 hour(s))  SARS Coronavirus 2 by RT PCR (hospital order, performed in [place] hospital lab) Nasopharyngeal Nasopharyngeal Swab     Status: None   Collection Time: 10/10/19  6:24 PM   Specimen: Nasopharyngeal Swab  Result Value Ref Range Status   SARS Coronavirus 2 NEGATIVE NEGATIVE Final    Comment: (NOTE) SARS-CoV-2 target nucleic acids are NOT DETECTED. The SARS-CoV-2 RNA is generally detectable in upper and lower respiratory specimens during the acute phase of infection. The lowest concentration of SARS-CoV-2 viral copies this assay can detect is 250 copies / mL. A negative result does not preclude SARS-CoV-2 infection and should not be used as the sole basis for treatment or other patient management decisions.  A negative result may occur with improper specimen collection / handling, submission of specimen other than nasopharyngeal swab, presence of viral mutation(s) within the areas targeted by this assay, and inadequate number of  viral copies (<250 copies / mL). A negative result must be combined with clinical observations, patient history, and epidemiological information. Fact Sheet for Patients:   StrictlyIdeas.no Fact Sheet for Healthcare Providers: BankingDealers.co.za This test is not yet approved or cleared  by the [place] FDA and has been authorized for detection and/or diagnosis of SARS-CoV-2 by FDA under an Emergency Use Authorization (EUA).  This EUA will remain in effect (meaning this test can be used) for the duration of the COVID-19 declaration under Section 564(b)(1) of the Act, 21 U.S.C. section 360bbb-3(b)(1), unless the authorization is terminated or revoked sooner. Performed at [facility], [address], [place], [place] [address]   Urine Culture     Status: Abnormal   Collection Time: 10/10/19  6:49 PM  Specimen: Urine, Clean Catch  Result Value Ref Range Status   Specimen Description   Final    URINE, CLEAN CATCH Performed at [facility], [address] [place], [place] [address]    Special Requests   Final    NONE Performed at [facility], [place], [place] [address]    Culture (A)  Final    80,000 COLONIES/mL ESCHERICHIA COLI >=100,000 COLONIES/mL VIRIDANS STREPTOCOCCUS Standardized susceptibility testing for this organism is not available. Performed at [facility], [place] [address] [place], [place] [address]    Report Status 10/12/2019 FINAL  Final   Organism ID, Bacteria ESCHERICHIA COLI (A)  Final      Susceptibility   Escherichia coli - MIC*    AMPICILLIN 8 SENSITIVE Sensitive     CEFAZOLIN <=4 SENSITIVE Sensitive     CEFTRIAXONE <=1 SENSITIVE Sensitive     CIPROFLOXACIN <=0.25 SENSITIVE Sensitive     GENTAMICIN <=1 SENSITIVE Sensitive     IMIPENEM <=0.25 SENSITIVE Sensitive     NITROFURANTOIN <=16 SENSITIVE Sensitive     TRIMETH/SULFA <=20 SENSITIVE Sensitive      AMPICILLIN/SULBACTAM <=2 SENSITIVE Sensitive     PIP/TAZO <=4 SENSITIVE Sensitive     * 80,000 COLONIES/mL ESCHERICHIA COLI     Time coordinating discharge: 25 minutes The [place] controlled substances registry was not reviewed, as this is an active Hospice patient.      SIGNED:   Edwin Dada, MD  [facility] 10/14/2019, 9:52 AM

## 2019-10-14 NOTE — Progress Notes (Signed)
Telephone report given to [place] at [facility].

## 2019-10-14 NOTE — Progress Notes (Signed)
[facility]) Hospital Liaison RN note  [facility] is able to offer a room today for Ms. Fluellen. Consents being completed now. Once complete, I will call for EMS.  RN please call report to [phone].  I will fax discharge summary to the [facility] when complete.  Please call with any hospice related questions or concerns.  Thank you, Margaretmary Eddy, BSN, RN [facility] Liaison [phone]

## 2019-10-14 NOTE — Progress Notes (Addendum)
Daily Progress Note   Patient Name: Sheila West       Date: 10/14/2019 DOB: [birth year]  Age: 86 y.o. MRN#: [id number] Attending Physician: Edwin Dada, * Primary Care Physician: Orvis Brill, Doctors Making Admit Date: 10/10/2019  Reason for Consultation/Follow-up: Establishing goals of care and Terminal Care  Subjective: Patient is resting in bed with eyes closed. Even and unlabored respirations. No family at bedside. Patient appears comfortable; no changes to medications recommended at this time.   Length of Stay: 4  Current Medications: Scheduled Meds:  . sodium chloride flush  10-40 mL Intracatheter Q12H  . sodium chloride flush  3 mL Intravenous Q12H    Continuous Infusions:   PRN Meds: alum & mag hydroxide-simeth, antiseptic oral rinse, glycopyrrolate **OR** glycopyrrolate **OR** glycopyrrolate, haloperidol **OR** haloperidol **OR** haloperidol lactate, LORazepam **OR** LORazepam **OR** LORazepam, morphine injection, ondansetron **OR** ondansetron (ZOFRAN) IV, polyvinyl alcohol, sodium chloride flush, sodium chloride flush  Physical Exam Constitutional:      Comments: Eyes closed.   Pulmonary:     Effort: Pulmonary effort is normal.             Vital Signs: BP (!) 173/80 (BP Location: Right Arm)   Pulse 65   Temp 98.1 F (36.7 C) (Oral)   Resp 18   Ht 5\' 5"  (1.651 m)   Wt 59.9 kg   SpO2 94%   BMI 21.97 kg/m  SpO2: SpO2: 94 % O2 Device: O2 Device: Room Air O2 Flow Rate:    Intake/output summary: No intake or output data in the 24 hours ending 10/14/19 1144 LBM:   Baseline Weight: Weight: 59.9 kg Most recent weight: Weight: 59.9 kg       Palliative Assessment/Data: 10%      Patient Active Problem List   Diagnosis Date Noted  . Mass of left  parotid gland   . Dysphagia 10/11/2019  . Acute metabolic encephalopathy [id number]  . CKD (chronic kidney disease), stage IIIa 07/17/2019  . Normocytic anemia 07/17/2019  . UTI (urinary tract infection) 07/17/2019  . Asthma   . Acute diverticulitis 11/30/2016  . AKI (acute kidney injury) ([place]) 11/30/2016  . Memory loss   . Anemia due to other cause   . GERD (gastroesophageal reflux disease) 02/13/2015  . Meningioma ([place]) 02/13/2015  . Leg swelling 02/13/2015  . Dementia  with behavioral disturbance ([place]) 02/13/2015  . Iron deficiency anemia due to chronic blood loss 02/13/2015  . Melena 02/13/2015  . Chest pain 01/27/2014  . Hyperlipidemia 01/27/2014  . Hypertension     Palliative Care Assessment & Plan     Recommendations/Plan: Waiting for bed at hospice facility.  Comfort based care.   Code Status:    Code Status Orders  (From admission, onward)         Start     Ordered   10/13/19 1232  Do not attempt resuscitation (DNR)  Continuous    Question Answer Comment  In the event of cardiac or respiratory ARREST Do not call a "code blue"   In the event of cardiac or respiratory ARREST Do not perform Intubation, CPR, defibrillation or ACLS   In the event of cardiac or respiratory ARREST Use medication by any route, position, wound care, and other measures to relive pain and suffering. May use oxygen, suction and manual treatment of airway obstruction as needed for comfort.   Comments MOST form in [place]      10/13/19 1233        Code Status History    Date Active Date Inactive Code Status Order ID Comments User Context   10/10/2019 2128 10/13/2019 1233 DNR [id number]  Christel Mormon, MD ED   07/17/2019 1857 07/18/2019 2111 DNR [id number]  Ivor Costa, MD Inpatient   07/17/2019 1303 07/17/2019 1857 DNR [id number]  Vanessa [place], MD ED   02/13/2015 1720 02/17/2015 2326 Full Code [id number]  Barton Dubois, MD Inpatient   01/27/2014 0647 01/28/2014 1325 Full Code [id number]  Theressa Millard, MD Inpatient   Advance Care Planning Activity      Prognosis:  < 2 weeks    Thank you for allowing the Palliative Medicine Team to assist in the care of this patient.   Total Time 15 min Prolonged Time Billed  no      Greater than 50%  of this time was spent counseling and coordinating care related to the above assessment and plan.  Asencion Gowda, NP  Please contact Palliative Medicine Team phone at [phone] for questions and concerns.

## 2019-11-02 DEATH — deceased

## 2021-07-09 IMAGING — CT CT HEAD W/O CM
4 series · 16 of 47 positions shown, 18 images · non-contrast
Comparison: Head CT 02/13/2015. Brain MRI 04/09/2017.

CLINICAL DATA: 89-year-old female found down. Unwitnessed fall.

EXAM:
CT HEAD WITHOUT CONTRAST
TECHNIQUE: Contiguous axial images were obtained from the base of the skull
through the vertex without intravenous contrast.

[Series 2: head wo · axial · 0.40mm/px · z∈[+88,+193]mm · 7 of 29 slices shown, 9 images]
[im 4/29  brain]
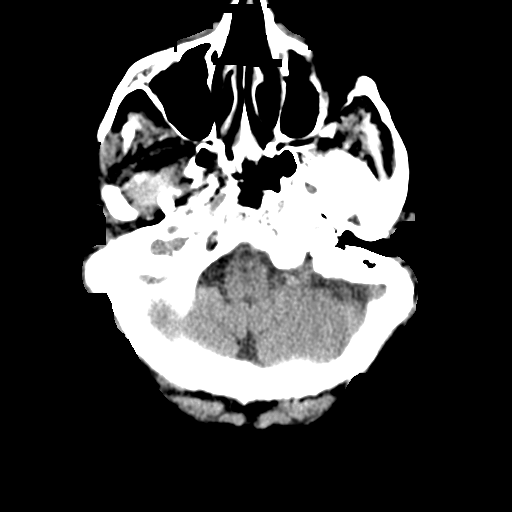
[im 4/29  bone]
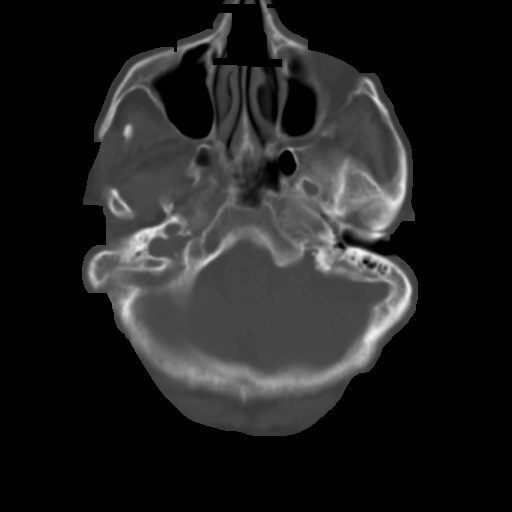
[im 8/29  brain]
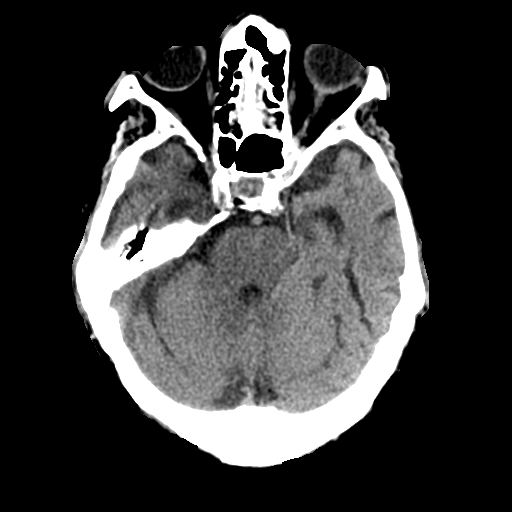
[im 11/29  brain]
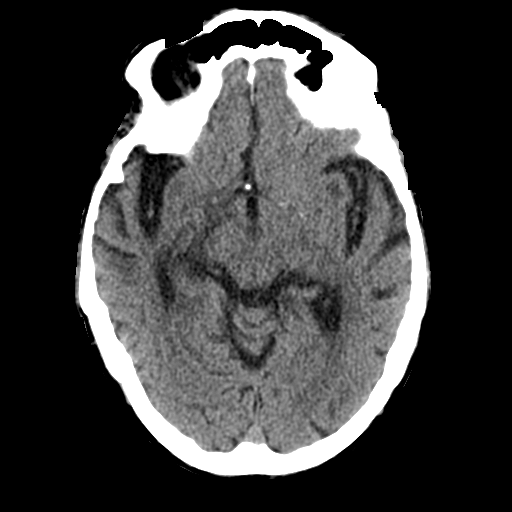
[im 15/29  brain]
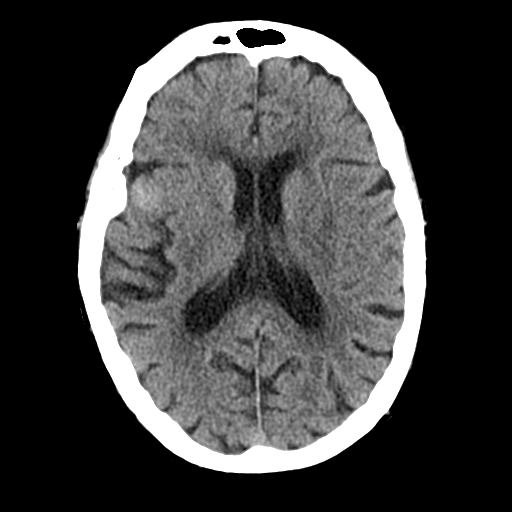
[im 18/29  brain]
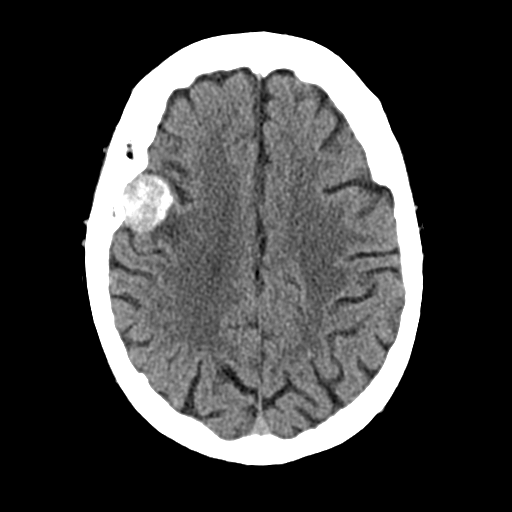
[im 18/29  bone]
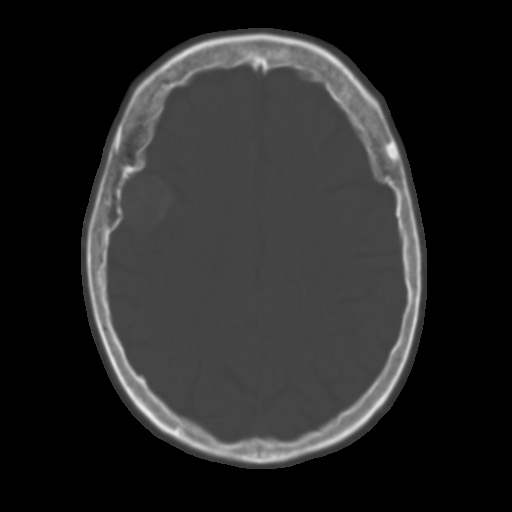
[im 22/29  brain]
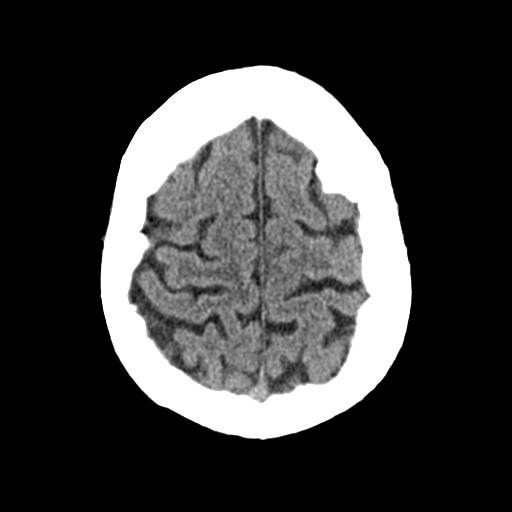
[im 25/29  brain]
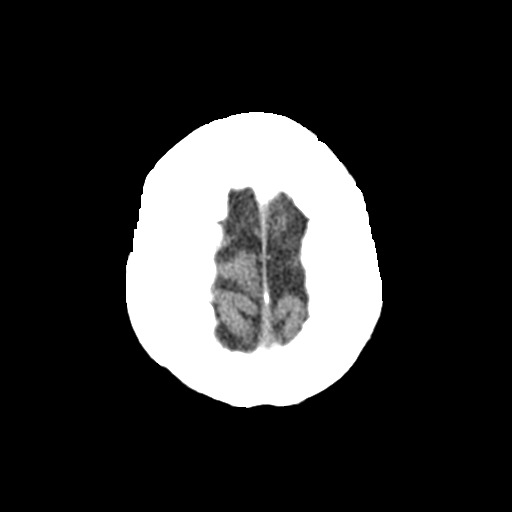

[Series 3: head bone · axial · 0.40mm/px · z∈[+87,+115]mm · 3 of 72 slices shown]
[im 8/72  bone]
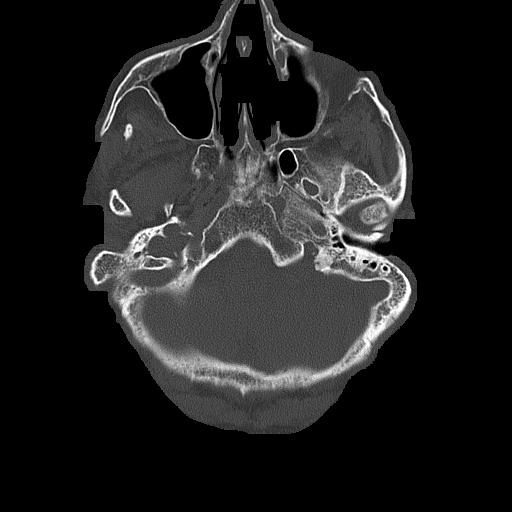
[im 15/72  bone]
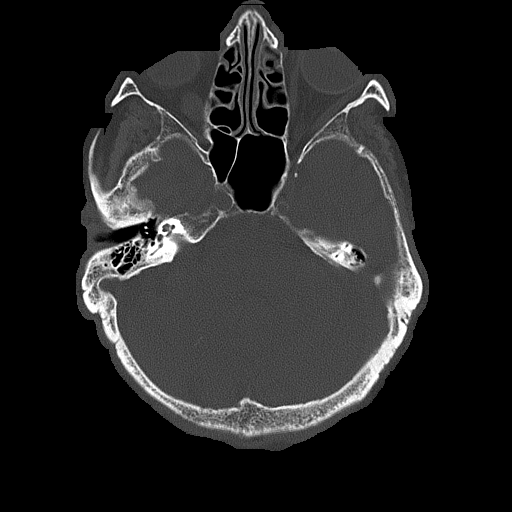
[im 22/72  bone]
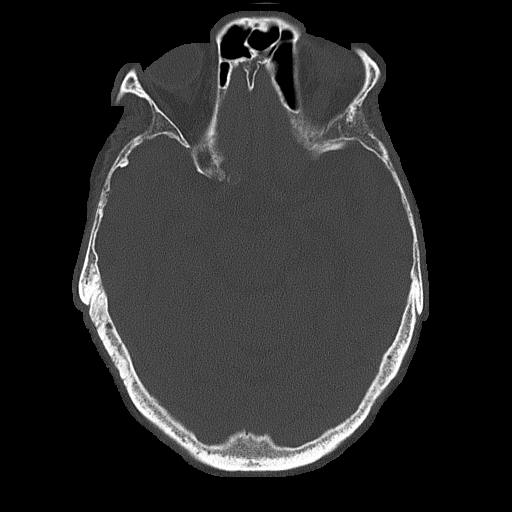

[Series 4: coronal soft tissue · coronal · 0.28mm/px · 3 of 68 slices shown]
[im 23/68  brain]
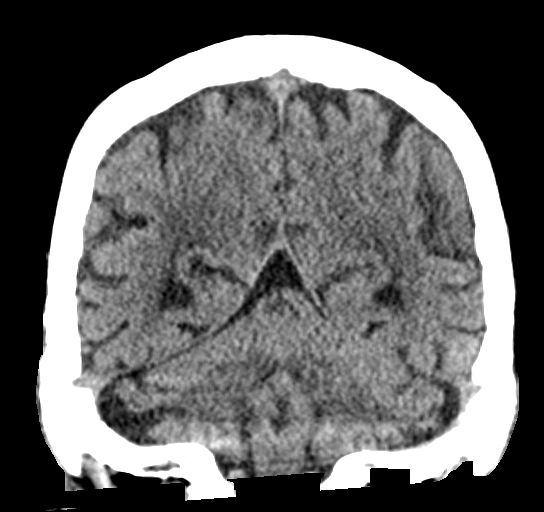
[im 30/68  brain]
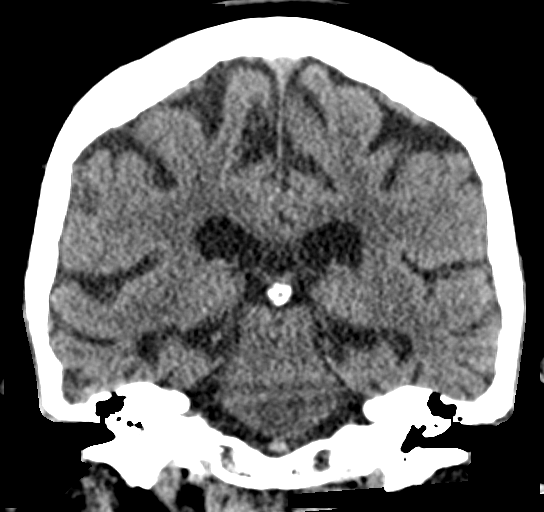
[im 38/68  brain]
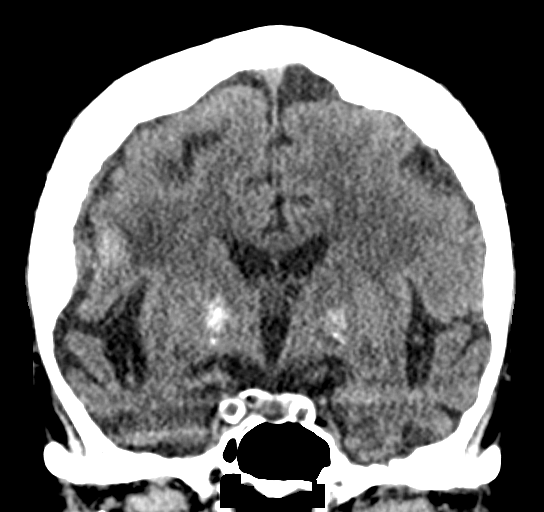

[Series 5: sagittal soft tissue · sagittal · 0.28mm/px · 3 of 52 slices shown]
[im 18/52  brain]
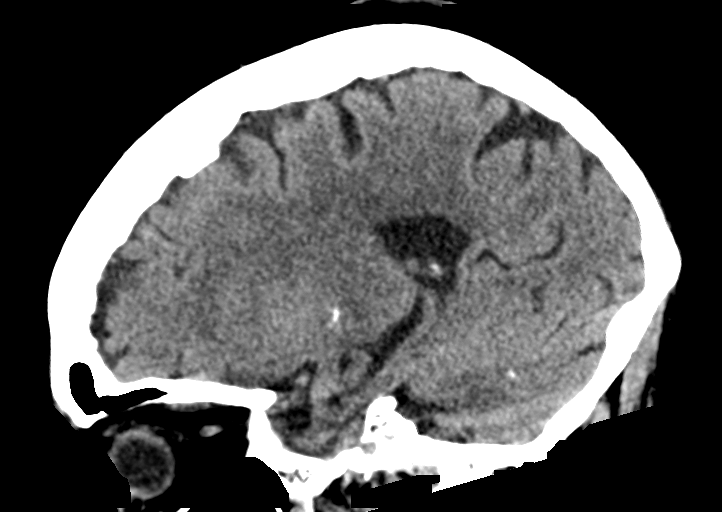
[im 26/52  brain]
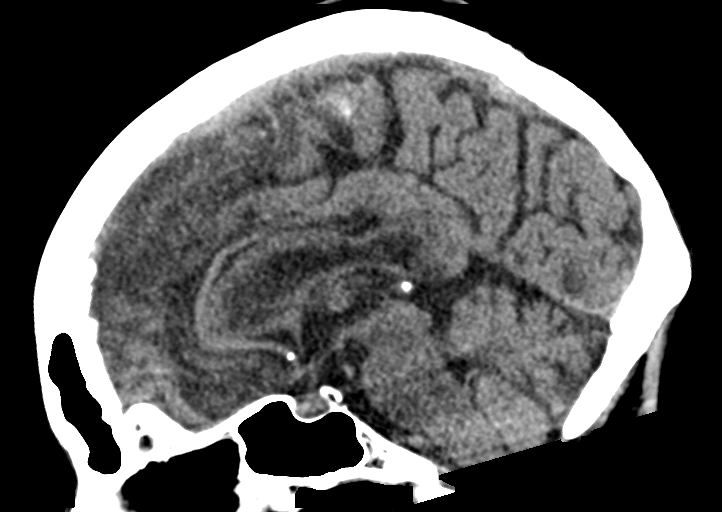
[im 35/52  brain]
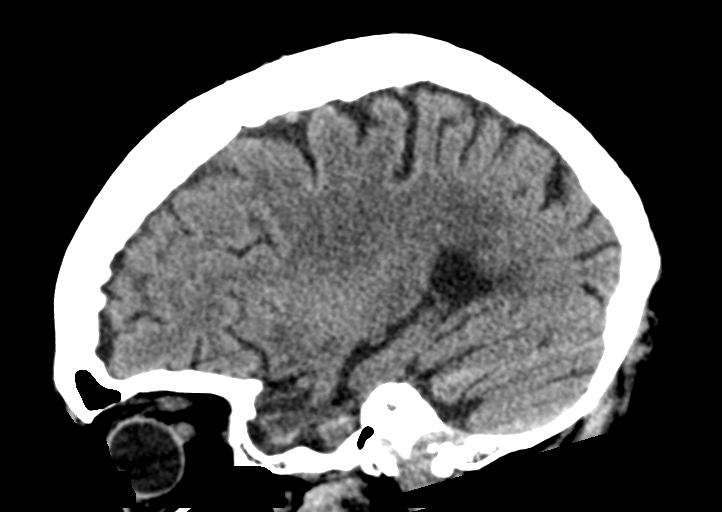

[16 of 47 positions shown; findings below may reference images not displayed]

FINDINGS: Brain: Chronic right anterior operculum meningioma measuring up to
24 millimeters diameter is stable since 0952. Chronic mild regional
mass effect and trace operculum edema, stable.

No midline shift or ventriculomegaly. Basilar cisterns remain
normal. Chronic dystrophic calcifications at the basal ganglia.
Patchy bilateral white matter hypodensity is stable. Mild dystrophic
calcifications in the deep cerebellar nuclei. No acute intracranial
hemorrhage identified. No cortically based acute infarct identified.

Vascular: Calcified atherosclerosis at the skull base. No suspicious
intracranial vascular hyperdensity.

Skull: Stable osteopenia and hyperostosis. No acute osseous
abnormality identified.

Sinuses/Orbits: Visualized paranasal sinuses and mastoids are stable
and well pneumatized.

Other: No acute orbit or scalp soft tissue findings.
IMPRESSION: 1. No acute intracranial abnormality or acute traumatic injury
identified.
2. Chronic right anterior operculum meningioma is stable since [DATE].
